# Patient Record
Sex: Female | Born: 1950
Health system: Southern US, Community
[De-identification: ages and names within clinical notes are randomized; demographics above are authoritative.]

## PROBLEM LIST (undated history)

## (undated) DIAGNOSIS — G43909 Migraine, unspecified, not intractable, without status migrainosus: Secondary | ICD-10-CM

## (undated) DIAGNOSIS — N189 Chronic kidney disease, unspecified: Secondary | ICD-10-CM

## (undated) DIAGNOSIS — G4730 Sleep apnea, unspecified: Secondary | ICD-10-CM

## (undated) DIAGNOSIS — H269 Unspecified cataract: Secondary | ICD-10-CM

## (undated) DIAGNOSIS — J45909 Unspecified asthma, uncomplicated: Secondary | ICD-10-CM

## (undated) DIAGNOSIS — F32A Depression, unspecified: Secondary | ICD-10-CM

## (undated) DIAGNOSIS — K219 Gastro-esophageal reflux disease without esophagitis: Secondary | ICD-10-CM

## (undated) DIAGNOSIS — J439 Emphysema, unspecified: Secondary | ICD-10-CM

## (undated) DIAGNOSIS — K589 Irritable bowel syndrome without diarrhea: Secondary | ICD-10-CM

## (undated) DIAGNOSIS — R011 Cardiac murmur, unspecified: Secondary | ICD-10-CM

## (undated) DIAGNOSIS — Z87440 Personal history of urinary (tract) infections: Secondary | ICD-10-CM

## (undated) DIAGNOSIS — J42 Unspecified chronic bronchitis: Secondary | ICD-10-CM

## (undated) DIAGNOSIS — K8020 Calculus of gallbladder without cholecystitis without obstruction: Secondary | ICD-10-CM

## (undated) DIAGNOSIS — R32 Unspecified urinary incontinence: Secondary | ICD-10-CM

## (undated) DIAGNOSIS — M1990 Unspecified osteoarthritis, unspecified site: Secondary | ICD-10-CM

## (undated) DIAGNOSIS — A630 Anogenital (venereal) warts: Secondary | ICD-10-CM

## (undated) DIAGNOSIS — D649 Anemia, unspecified: Secondary | ICD-10-CM

## (undated) DIAGNOSIS — R739 Hyperglycemia, unspecified: Secondary | ICD-10-CM

## (undated) DIAGNOSIS — F419 Anxiety disorder, unspecified: Secondary | ICD-10-CM

## (undated) DIAGNOSIS — F1910 Other psychoactive substance abuse, uncomplicated: Secondary | ICD-10-CM

## (undated) DIAGNOSIS — I499 Cardiac arrhythmia, unspecified: Secondary | ICD-10-CM

## (undated) DIAGNOSIS — F4310 Post-traumatic stress disorder, unspecified: Secondary | ICD-10-CM

## (undated) DIAGNOSIS — T7840XA Allergy, unspecified, initial encounter: Secondary | ICD-10-CM

## (undated) DIAGNOSIS — K760 Fatty (change of) liver, not elsewhere classified: Secondary | ICD-10-CM

## (undated) DIAGNOSIS — F329 Major depressive disorder, single episode, unspecified: Secondary | ICD-10-CM

## (undated) HISTORY — DX: Anogenital (venereal) warts: A63.0

## (undated) HISTORY — DX: Allergy, unspecified, initial encounter: T78.40XA

## (undated) HISTORY — DX: Unspecified osteoarthritis, unspecified site: M19.90

## (undated) HISTORY — DX: Unspecified asthma, uncomplicated: J45.909

## (undated) HISTORY — DX: Unspecified cataract: H26.9

## (undated) HISTORY — DX: Depression, unspecified: F32.A

## (undated) HISTORY — DX: Anemia, unspecified: D64.9

## (undated) HISTORY — DX: Other psychoactive substance abuse, uncomplicated: F19.10

## (undated) HISTORY — DX: Personal history of urinary (tract) infections: Z87.440

## (undated) HISTORY — DX: Unspecified chronic bronchitis: J42

## (undated) HISTORY — DX: Cardiac murmur, unspecified: R01.1

## (undated) HISTORY — DX: Calculus of gallbladder without cholecystitis without obstruction: K80.20

## (undated) HISTORY — DX: Gastro-esophageal reflux disease without esophagitis: K21.9

## (undated) HISTORY — DX: Anxiety disorder, unspecified: F41.9

## (undated) HISTORY — DX: Unspecified urinary incontinence: R32

## (undated) HISTORY — DX: Hyperglycemia, unspecified: R73.9

## (undated) HISTORY — DX: Fatty (change of) liver, not elsewhere classified: K76.0

## (undated) HISTORY — DX: Emphysema, unspecified: J43.9

## (undated) HISTORY — DX: Irritable bowel syndrome, unspecified: K58.9

## (undated) HISTORY — DX: Sleep apnea, unspecified: G47.30

## (undated) HISTORY — DX: Cardiac arrhythmia, unspecified: I49.9

## (undated) HISTORY — DX: Major depressive disorder, single episode, unspecified: F32.9

## (undated) HISTORY — DX: Chronic kidney disease, unspecified: N18.9

## (undated) HISTORY — DX: Post-traumatic stress disorder, unspecified: F43.10

## (undated) HISTORY — PX: CHOLECYSTECTOMY: SHX55

## (undated) HISTORY — DX: Migraine, unspecified, not intractable, without status migrainosus: G43.909

## (undated) HISTORY — PX: TUBAL LIGATION: SHX77

---

## 2016-06-21 ENCOUNTER — Ambulatory Visit (INDEPENDENT_AMBULATORY_CARE_PROVIDER_SITE_OTHER): Payer: Self-pay | Admitting: Family Medicine

## 2016-06-21 ENCOUNTER — Encounter: Payer: Self-pay | Admitting: Family Medicine

## 2016-06-21 VITALS — BP 130/86 | HR 84 | Temp 97.7°F | Ht 65.0 in | Wt 184.2 lb

## 2016-06-21 DIAGNOSIS — F331 Major depressive disorder, recurrent, moderate: Secondary | ICD-10-CM

## 2016-06-21 DIAGNOSIS — R002 Palpitations: Secondary | ICD-10-CM

## 2016-06-21 DIAGNOSIS — G4730 Sleep apnea, unspecified: Secondary | ICD-10-CM | POA: Insufficient documentation

## 2016-06-21 DIAGNOSIS — R350 Frequency of micturition: Secondary | ICD-10-CM | POA: Insufficient documentation

## 2016-06-21 DIAGNOSIS — F32A Depression, unspecified: Secondary | ICD-10-CM | POA: Insufficient documentation

## 2016-06-21 DIAGNOSIS — F329 Major depressive disorder, single episode, unspecified: Secondary | ICD-10-CM | POA: Insufficient documentation

## 2016-06-21 LAB — COMPREHENSIVE METABOLIC PANEL
ALBUMIN: 4 g/dL (ref 3.5–5.2)
ALT: 33 U/L (ref 0–35)
AST: 24 U/L (ref 0–37)
Alkaline Phosphatase: 66 U/L (ref 39–117)
BILIRUBIN TOTAL: 0.4 mg/dL (ref 0.2–1.2)
BUN: 10 mg/dL (ref 6–23)
CALCIUM: 9.9 mg/dL (ref 8.4–10.5)
CO2: 28 mEq/L (ref 19–32)
CREATININE: 0.98 mg/dL (ref 0.40–1.20)
Chloride: 104 mEq/L (ref 96–112)
GFR: 60.85 mL/min (ref >60.00)
Glucose, Bld: 92 mg/dL (ref 70–99)
Potassium: 4 mEq/L (ref 3.5–5.1)
SODIUM: 141 meq/L (ref 135–145)
Total Protein: 6.9 g/dL (ref 6.0–8.3)

## 2016-06-21 LAB — POCT URINALYSIS DIPSTICK
BILIRUBIN UA: NEGATIVE
Glucose, UA: NEGATIVE
KETONES UA: NEGATIVE
LEUKOCYTES UA: NEGATIVE
NITRITE UA: NEGATIVE
PH UA: 5.5
PROTEIN UA: NEGATIVE
Spec Grav, UA: 1.015
Urobilinogen, UA: 0.2

## 2016-06-21 LAB — CBC
HCT: 43.4 % (ref 36.0–46.0)
Hemoglobin: 14.6 g/dL (ref 12.0–15.0)
MCHC: 33.6 g/dL (ref 30.0–36.0)
MCV: 91.3 fl (ref 78.0–100.0)
Platelets: 196 10*3/uL (ref 150.0–400.0)
RBC: 4.75 Mil/uL (ref 3.87–5.11)
RDW: 13.4 % (ref 11.5–15.5)
WBC: 6.7 10*3/uL (ref 4.0–10.5)

## 2016-06-21 LAB — URINALYSIS, MICROSCOPIC ONLY: RBC / HPF: NONE SEEN (ref 0–?)

## 2016-06-21 LAB — TSH: TSH: 2.31 u[IU]/mL (ref 0.35–4.50)

## 2016-06-21 NOTE — Addendum Note (Signed)
Addended by: Frutoso Chase A on: 06/21/2016 12:50 PM   Modules accepted: Orders

## 2016-06-21 NOTE — Assessment & Plan Note (Signed)
She notes chronic urinary frequency and mild incontinence that can be stress related or unknown cause. Benign abdominal exam today. We will obtain a UA and CMP to evaluate glucose and kidney function. She'll continue to monitor at this time.

## 2016-06-21 NOTE — Assessment & Plan Note (Addendum)
Patient with a history of sleep apnea not currently on CPAP. Notes fatigue and tiredness all the time. Suspect related to untreated sleep apnea and possibly depression. Discussed the importance of using CPAP. When she is able to afford this she will consider using it. Memory issues could be related to depression or sleep apnea. She'll continue to monitor.

## 2016-06-21 NOTE — Patient Instructions (Signed)
Nice to meet you. We will check lab work and call you with the results. I would suggest seeing a cardiologist for your palpitations. I would suggest seeing a therapist for your depression. I recommend that you be evaluated for your sleep apnea for a CPAP. If you develop persistent palpitations or develop chest pain, shortness of breath, thoughts of harming yourself or others, or any new or changing symptoms please seek medical attention immediately.

## 2016-06-21 NOTE — Progress Notes (Signed)
Tommi Rumps, MD Phone: [phone]  Regina Nash is a 65 y.o. female who presents today for new patient visit.  Palpitations: Patient notes for most of her life she has felt irregular heartbeats and palpitations. Can occur if she is fatigued or lifting heavy weights. Also with walking at times. Also with anxiety. Notes they last less than a minute. Occur daily. No chest pain, shortness breath, or edema with them. She does report having had a Holter monitor about 12 years ago that she says showed nothing significant.  Depression: Has been dealing with this her whole life. Has a general feeling of sadness. She has no hopes or dreams. No SI or HI. Not currently on any medications. Not willing to start medications at this time. Would consider a therapist when she can afford it.   Urine frequency and incontinence: Patient notes for many years she's had issues with leaking small amounts of urine. She occasionally has stress incontinence. No urgency. Does have frequent urination. No changes in this over the years.  Sleep apnea: Previously evaluated for this and is supposed to be on CPAP though cannot afford it. She is fatigued and tired all the time. She'll fall sleep in front of the TV easily. Does not fall asleep while driving. Does snore.  Also notes some memory issues where she just can't remember recent events. She thinks this is due to her being foggy.  Active Ambulatory Problems    Diagnosis Date Noted  . Palpitations 06/21/2016  . Sleep apnea 06/21/2016  . Depression 06/21/2016  . Urinary frequency 06/21/2016   Resolved Ambulatory Problems    Diagnosis Date Noted  . No Resolved Ambulatory Problems   Past Medical History:  Diagnosis Date  . Allergy   . Arrhythmia   . Arthritis   . Asthma   . Chronic bronchitis ([place])   . Depression   . Genital warts   . GERD (gastroesophageal reflux disease)   . History of recurrent UTIs   . Hyperglycemia   . Migraine   . Sleep apnea   .  Substance abuse   . Urine incontinence     Family History  Problem Relation Age of Onset  . Alcoholism Other     Parent  . Arthritis Other     Parent  . Cancer Other     Breast, ovarian/uterine    Social History   Social History  . Marital status: Married    Spouse name: N/A  . Number of children: N/A  . Years of education: N/A   Occupational History  . Not on file.   Social History Main Topics  . Smoking status: Current Every Day Smoker    Types: Cigarettes  . Smokeless tobacco: Never Used  . Alcohol use Yes  . Drug use: No  . Sexual activity: Not on file   Other Topics Concern  . Not on file   Social History Narrative  . No narrative on file    ROS  General:  Negative for nexplained weight loss, fever Skin: Negative for new or changing mole, sore that won't heal HEENT: Positive for tinnitus, Negative for trouble hearing, trouble seeing, mouth sores, hoarseness, change in voice, dysphagia. CV: Positive for palpitations, Negative for chest pain, dyspnea, edema Resp: Negative for cough, dyspnea, hemoptysis GI: Positive for constipation, Negative for nausea, vomiting, diarrhea, abdominal pain, melena, hematochezia. GU: Positive for incontinence, frequent urination, Negative for dysuria, urinary hesitance, hematuria, vaginal or penile discharge, polyuria, sexual difficulty, lumps in testicle or breasts  MSK: Negative for muscle cramps or aches, positive for positive joint pain or swelling Neuro: Negative for headaches, weakness, numbness, dizziness, passing out/fainting Psych: Negative for depression, memory problems, negative for anxiety  Objective  Physical Exam Vitals:   06/21/16 1003  BP: 130/86  Pulse: 84  Temp: 97.7 F (36.5 C)    BP Readings from Last 3 Encounters:  06/21/16 130/86   Wt Readings from Last 3 Encounters:  06/21/16 184 lb 3.2 oz (83.6 kg)    Physical Exam  Constitutional: No distress.  HENT:  Head: Normocephalic and  atraumatic.  Mouth/Throat: Oropharynx is clear and moist.  Eyes: Conjunctivae are normal. Pupils are equal, round, and reactive to light.  Cardiovascular: Normal rate, regular rhythm and normal heart sounds.   Pulmonary/Chest: Effort normal and breath sounds normal.  Abdominal: Soft. Bowel sounds are normal. She exhibits no distension. There is no tenderness. There is no rebound and no guarding.  Musculoskeletal: She exhibits no edema.  Neurological: She is alert. Gait normal.  Skin: Skin is warm and dry. She is not diaphoretic.  Psychiatric:  Mood depressed, affect flat   EKG: Normal sinus rhythm, rate 80, inverted T-wave in V2, otherwise no T-wave or ST changes, no prior EKG to compare  Assessment/Plan:   Palpitations Patient with chronic history of palpitations. EKG performed today with nonspecific T-wave inversion though no arrhythmia noted. We will obtain lab work as outlined below. Advise cardiology referral though given patient's lack of insurance we will obtain lab work first to evaluate for cause. If no abnormalities on lab work we will refer to cardiology. Given return precautions.  Sleep apnea Patient with a history of sleep apnea not currently on CPAP. Notes fatigue and tiredness all the time. Suspect related to untreated sleep apnea and possibly depression. Discussed the importance of using CPAP. When she is able to afford this she will consider using it. Memory issues could be related to depression or sleep apnea. She'll continue to monitor.  Depression Patient with lifelong issues with depression. No SI or HI at this time. Offered medication though she declined. Offered referral to a therapist or counselor and she is unable to afford this at this time. She'll let us know when she is ready to see a therapist. Return precautions given.  Urinary frequency She notes chronic urinary frequency and mild incontinence that can be stress related or unknown cause. Benign abdominal exam  today. We will obtain a UA and CMP to evaluate glucose and kidney function. She'll continue to monitor at this time.   Orders Placed This Encounter  Procedures  . Comp Met (CMET)  . CBC  . TSH  . POCT Urinalysis Dipstick  . EKG 12-Lead    Tommi Rumps, MD [place]

## 2016-06-21 NOTE — Assessment & Plan Note (Addendum)
Patient with chronic history of palpitations. EKG performed today with nonspecific T-wave inversion though no arrhythmia noted. We will obtain lab work as outlined below. Advise cardiology referral though given patient's lack of insurance we will obtain lab work first to evaluate for cause. If no abnormalities on lab work we will refer to cardiology. Given return precautions.

## 2016-06-21 NOTE — Progress Notes (Signed)
Pre visit review using our clinic review tool, if applicable. No additional management support is needed unless otherwise documented below in the visit note. 

## 2016-06-21 NOTE — Assessment & Plan Note (Signed)
Patient with lifelong issues with depression. No SI or HI at this time. Offered medication though she declined. Offered referral to a therapist or counselor and she is unable to afford this at this time. She'll let us know when she is ready to see a therapist. Return precautions given.

## 2016-06-22 ENCOUNTER — Other Ambulatory Visit: Payer: Self-pay | Admitting: Family Medicine

## 2016-06-22 DIAGNOSIS — R002 Palpitations: Secondary | ICD-10-CM

## 2016-06-22 LAB — URINE CULTURE: Organism ID, Bacteria: NO GROWTH

## 2016-06-28 ENCOUNTER — Telehealth: Payer: Self-pay | Admitting: Family Medicine

## 2016-06-28 NOTE — Telephone Encounter (Signed)
Pt called back returning your call. Thank you!  Call pt @ [phone]

## 2016-06-28 NOTE — Telephone Encounter (Signed)
Patient notified

## 2016-07-13 ENCOUNTER — Telehealth: Payer: Self-pay | Admitting: Family Medicine

## 2016-07-13 NOTE — Telephone Encounter (Signed)
Pt came in to drop off forms for accommodation for work. Forms is in folder up front. Thank you!

## 2016-07-17 ENCOUNTER — Ambulatory Visit: Payer: Self-pay | Admitting: Cardiology

## 2016-07-17 NOTE — Telephone Encounter (Signed)
noted 

## 2016-07-27 ENCOUNTER — Telehealth: Payer: Self-pay

## 2016-07-27 NOTE — Telephone Encounter (Signed)
Left message to notify forms are ready to be picked up

## 2016-07-27 NOTE — Telephone Encounter (Signed)
Left message to notify forms are ready

## 2016-07-27 NOTE — Telephone Encounter (Signed)
Forms completed. Given to [place]. Of note patient was advised that she needs to see cardiology though she is refusing given that she does not have insurance. Given palpitations form was filled out to say that she should not perform any strenuous work and limit her lifting to less than 20 pounds. Limited to walking and standing still at work until she is evaluated by cardiology if she ever agrees to be evaluated by cardiology.

## 2016-08-22 ENCOUNTER — Ambulatory Visit: Payer: Self-pay | Admitting: Family Medicine

## 2017-07-12 ENCOUNTER — Telehealth: Payer: Self-pay

## 2017-07-12 NOTE — Telephone Encounter (Signed)
Informed patient that we have no appointments available here or [place]. Patient refused to drive all the way to [place]. Informed patient that she has to go to UC/ED for her back pain.

## 2017-07-12 NOTE — Telephone Encounter (Signed)
Copied from [place] [id number]. Topic: Appointment Scheduling - Scheduling Inquiry for Clinic >> Jul 12, 2017  2:09 PM Regina Nash, NT wrote: Reason for CRM: patient states her back has been hurting for 2 weeks. All providers are full until end of January. She is wanting to see someone in this office. She also is a patient of Caryl Bis and he is full until March. Please advise and contact patient back with an appt.

## 2017-08-27 ENCOUNTER — Telehealth: Payer: Self-pay | Admitting: Family Medicine

## 2017-08-27 NOTE — Telephone Encounter (Signed)
Please create a letter for her stating that she has a doctor's appointment on that day.  Thanks.

## 2017-08-27 NOTE — Telephone Encounter (Signed)
Pt would like a call back in regards to her going to urgent care for back pain.  And wanted a sooner appt than 10/15/17

## 2017-08-27 NOTE — Telephone Encounter (Signed)
Patient states she needs a letter stating that she has an appointment on 10/15/2017 with you so she can get it approved to be off for the appointment Attn: Leave of Absence team [employer] fax:[phone]

## 2017-08-28 NOTE — Telephone Encounter (Signed)
Faxed and patient notified. 

## 2017-09-12 NOTE — Telephone Encounter (Signed)
Pt calling and states that the letter that was sent to her job on 08/28/17 about that the pt has an appt on 10/15/17 needs to be faxed to her disability as well. Fax#: [phone]. Claim #: [id number] Claim number needs to be on the letter. Please call pt once sent.

## 2017-09-13 NOTE — Telephone Encounter (Signed)
Faxed copy of letter as requested patient notified.

## 2017-10-15 ENCOUNTER — Ambulatory Visit (INDEPENDENT_AMBULATORY_CARE_PROVIDER_SITE_OTHER): Payer: Managed Care, Other (non HMO) | Admitting: Family Medicine

## 2017-10-15 ENCOUNTER — Ambulatory Visit (INDEPENDENT_AMBULATORY_CARE_PROVIDER_SITE_OTHER): Payer: Managed Care, Other (non HMO)

## 2017-10-15 ENCOUNTER — Encounter: Payer: Self-pay | Admitting: Family Medicine

## 2017-10-15 VITALS — BP 130/88 | HR 91 | Temp 98.2°F | Resp 18 | Ht 65.0 in | Wt 182.1 lb

## 2017-10-15 DIAGNOSIS — Z1231 Encounter for screening mammogram for malignant neoplasm of breast: Secondary | ICD-10-CM | POA: Diagnosis not present

## 2017-10-15 DIAGNOSIS — M545 Low back pain, unspecified: Secondary | ICD-10-CM

## 2017-10-15 DIAGNOSIS — G4733 Obstructive sleep apnea (adult) (pediatric): Secondary | ICD-10-CM | POA: Diagnosis not present

## 2017-10-15 DIAGNOSIS — Z1211 Encounter for screening for malignant neoplasm of colon: Secondary | ICD-10-CM

## 2017-10-15 DIAGNOSIS — G8929 Other chronic pain: Secondary | ICD-10-CM | POA: Insufficient documentation

## 2017-10-15 DIAGNOSIS — Z1239 Encounter for other screening for malignant neoplasm of breast: Secondary | ICD-10-CM

## 2017-10-15 DIAGNOSIS — G4730 Sleep apnea, unspecified: Secondary | ICD-10-CM | POA: Diagnosis not present

## 2017-10-15 NOTE — Patient Instructions (Signed)
Nice to see you. We will get an x-ray of your back.  We will call you with the results. We will get you set up for cologuard.  Please call your insurance company to make sure they cover this. We will get a mammogram scheduled and a sleep study as well. You develop weakness, bowel or bladder dysfunction, or numbness between your legs or any worsening back pain please be evaluated immediately.

## 2017-10-15 NOTE — Assessment & Plan Note (Addendum)
New issue to me though this has been a persistent issue.  She needs paperwork filled out for being out of work since the injury occurred.  She does bring some records from paperwork that has been filled out by the urgent care physician.  This has been reviewed.  Records received from urgent care and have been reviewed.  These will be scanned into the chart.  She will continue physical therapy.  Will obtain an x-ray.  She likely will need an MRI.  Given return precautions.

## 2017-10-15 NOTE — Assessment & Plan Note (Signed)
Sleep study ordered

## 2017-10-15 NOTE — Progress Notes (Signed)
Tommi Rumps, MD Phone: [phone]  Regina Nash is a 67 y.o. female who presents today for follow-up.  Low back pain: Notes in December she stepped over a gate and then twisted her back.  She was evaluated at an urgent care.  No imaging was done.  Notes since then she has had persistent back pain.  She has had to sleep on the couch as that is beneficial.  She has had some numbness in her left toes.  No bowel or bladder incontinence or saddle anesthesia.  Prior to this she had rare back pain.  She has been seeing physical therapy with some benefit.  She has not had imaging.  OSA: Notes hypersomnia and snoring.  She does not wake up well rested.  She has not been able to afford a sleep study previously though now has insurance.  She would be willing to proceed with this now.  It has been sometime since her last mammogram.  She has also not had colon cancer screening.  These will be ordered.  Social History   Tobacco Use  Smoking Status Current Every Day Smoker  . Types: Cigarettes  Smokeless Tobacco Never Used     ROS see history of present illness  Objective  Physical Exam Vitals:   10/15/17 1321  BP: 130/88  Pulse: 91  Resp: 18  Temp: 98.2 F (36.8 C)  SpO2: 96%    BP Readings from Last 3 Encounters:  10/15/17 130/88  06/21/16 130/86   Wt Readings from Last 3 Encounters:  10/15/17 182 lb 2 oz (82.6 kg)  06/21/16 184 lb 3.2 oz (83.6 kg)    Physical Exam  Constitutional: No distress.  Cardiovascular: Normal rate, regular rhythm and normal heart sounds.  Pulmonary/Chest: Effort normal and breath sounds normal.  Musculoskeletal: She exhibits no edema.  No midline spine tenderness, no midline spine step-off, there is paraspinous muscular back tenderness in the lumbar spine  Neurological: She is alert. Gait normal.  5/5 strength bilateral quads, hamstrings, plantar flexion, and dorsiflexion, sensation light touch intact bilateral lower extremities  Skin: Skin is  warm and dry. She is not diaphoretic.     Assessment/Plan: Please see individual problem list.  Sleep apnea Sleep study ordered.  Acute bilateral low back pain without sciatica New issue to me though this has been a persistent issue.  She needs paperwork filled out for being out of work since the injury occurred.  She does bring some records from paperwork that has been filled out by the urgent care physician.  This has been reviewed.  Records received from urgent care and have been reviewed.  These will be scanned into the chart.  She will continue physical therapy.  Will obtain an x-ray.  She likely will need an MRI.  Given return precautions.  Colon cancer screening Cologuard ordered.  Breast cancer screening Mammogram scheduled.   Health Maintenance: Mammogram ordered.  Cologuard ordered.  Orders Placed This Encounter  Procedures  . MM SCREENING BREAST TOMO BILATERAL    Standing Status:   Future    Standing Expiration Date:   12/16/2018    Order Specific Question:   Reason for Exam (SYMPTOM  OR DIAGNOSIS REQUIRED)    Answer:   breast cancer screening    Order Specific Question:   Preferred imaging location?    Answer:   [facility]  . DG Lumbar Spine Complete    Standing Status:   Future    Number of Occurrences:   1  Standing Expiration Date:   12/16/2018    Order Specific Question:   Reason for Exam (SYMPTOM  OR DIAGNOSIS REQUIRED)    Answer:   low back pain, numbness left toes, injury in january    Order Specific Question:   Preferred imaging location?    Answer:   [facility] Specific Question:   Radiology Contrast Protocol - do NOT remove file path    Answer:   \\[email or web address]  . Cologuard  . Split night study    Standing Status:   Future    Standing Expiration Date:   10/16/2018    Order Specific Question:   Where should this test be performed:    Answer:   [facility]    No orders of the  defined types were placed in this encounter.    Tommi Rumps, MD [place]

## 2017-10-15 NOTE — Progress Notes (Signed)
Pre-visit discussion using our clinic review tool. No additional management support is needed unless otherwise documented below in the visit note.  

## 2017-10-17 ENCOUNTER — Telehealth: Payer: Self-pay

## 2017-10-17 DIAGNOSIS — Z1211 Encounter for screening for malignant neoplasm of colon: Secondary | ICD-10-CM | POA: Insufficient documentation

## 2017-10-17 DIAGNOSIS — Z1239 Encounter for other screening for malignant neoplasm of breast: Secondary | ICD-10-CM | POA: Insufficient documentation

## 2017-10-17 DIAGNOSIS — Z780 Asymptomatic menopausal state: Secondary | ICD-10-CM

## 2017-10-17 NOTE — Assessment & Plan Note (Signed)
Mammogram scheduled.

## 2017-10-17 NOTE — Assessment & Plan Note (Signed)
Cologuard ordered

## 2017-10-17 NOTE — Telephone Encounter (Signed)
Copied from [place] [phone]. Topic: General - Other >> Oct 17, 2017  3:43 PM Synthia Innocent wrote: Patient would like to move forward with Bone Density

## 2017-10-18 NOTE — Telephone Encounter (Signed)
Ordered

## 2017-10-18 NOTE — Telephone Encounter (Signed)
fyi

## 2017-10-18 NOTE — Addendum Note (Signed)
Addended by: Leone Haven on: 10/18/2017 06:51 PM   Modules accepted: Orders

## 2017-10-21 ENCOUNTER — Telehealth: Payer: Self-pay

## 2017-10-21 NOTE — Telephone Encounter (Signed)
Faxed completed forms to [employer] LOA team with OV note to [phone]. Faxed OV note to [place] at [phone]. Patient notified.

## 2017-10-30 ENCOUNTER — Encounter: Payer: Self-pay | Admitting: Family Medicine

## 2017-11-06 ENCOUNTER — Ambulatory Visit
Admission: RE | Admit: 2017-11-06 | Discharge: 2017-11-06 | Disposition: A | Discharge status: Home / Self Care | Payer: Managed Care, Other (non HMO) | Source: Ambulatory Visit | Attending: Family Medicine | Admitting: Family Medicine

## 2017-11-06 DIAGNOSIS — Z1231 Encounter for screening mammogram for malignant neoplasm of breast: Secondary | ICD-10-CM | POA: Insufficient documentation

## 2017-11-06 DIAGNOSIS — Z1239 Encounter for other screening for malignant neoplasm of breast: Secondary | ICD-10-CM

## 2017-11-08 ENCOUNTER — Other Ambulatory Visit: Payer: Self-pay | Admitting: Family Medicine

## 2017-11-08 DIAGNOSIS — N6489 Other specified disorders of breast: Secondary | ICD-10-CM

## 2017-11-08 DIAGNOSIS — R928 Other abnormal and inconclusive findings on diagnostic imaging of breast: Secondary | ICD-10-CM

## 2017-11-11 ENCOUNTER — Ambulatory Visit (INDEPENDENT_AMBULATORY_CARE_PROVIDER_SITE_OTHER): Payer: 59 | Admitting: Psychology

## 2017-11-11 DIAGNOSIS — F332 Major depressive disorder, recurrent severe without psychotic features: Secondary | ICD-10-CM

## 2017-11-12 LAB — COLOGUARD: Cologuard: POSITIVE

## 2017-11-13 ENCOUNTER — Ambulatory Visit
Admission: RE | Admit: 2017-11-13 | Discharge: 2017-11-13 | Disposition: A | Discharge status: Home / Self Care | Payer: Managed Care, Other (non HMO) | Source: Ambulatory Visit | Attending: Family Medicine | Admitting: Family Medicine

## 2017-11-13 DIAGNOSIS — N6489 Other specified disorders of breast: Secondary | ICD-10-CM | POA: Insufficient documentation

## 2017-11-13 DIAGNOSIS — R928 Other abnormal and inconclusive findings on diagnostic imaging of breast: Secondary | ICD-10-CM

## 2017-11-14 ENCOUNTER — Telehealth: Payer: Self-pay | Admitting: Family Medicine

## 2017-11-14 NOTE — Telephone Encounter (Unsigned)
Copied from [place] [id number]. Topic: Quick Communication - See Telephone Encounter >> Nov 14, 2017  3:25 PM Neva Seat wrote: Pt needing a copy of the leave of absence notes on form that was filled out on April 2 at her last visit. Please call pt to discuss where they can be sent or to be picked up.

## 2017-11-14 NOTE — Telephone Encounter (Signed)
Please advise 

## 2017-11-18 NOTE — Telephone Encounter (Signed)
Left message to return call, I do not see these scanned in the chart form April. [place] for pec to speak to patient

## 2017-11-18 NOTE — Telephone Encounter (Signed)
Pt would like to know where the original copies of her leave of absence forms are? She states they were faxed to [place] and [employer] on 10/22/17 from the office. Please advise.

## 2017-11-19 NOTE — Telephone Encounter (Signed)
Informed patient that these have been sent to scanning and they are not in the chart yet

## 2017-11-20 ENCOUNTER — Other Ambulatory Visit (HOSPITAL_COMMUNITY)
Admission: RE | Admit: 2017-11-20 | Discharge: 2017-11-20 | Disposition: A | Discharge status: Home / Self Care | Payer: Managed Care, Other (non HMO) | Source: Ambulatory Visit | Attending: Family Medicine | Admitting: Family Medicine

## 2017-11-20 ENCOUNTER — Ambulatory Visit (INDEPENDENT_AMBULATORY_CARE_PROVIDER_SITE_OTHER): Payer: Managed Care, Other (non HMO) | Admitting: Family Medicine

## 2017-11-20 ENCOUNTER — Encounter: Payer: Self-pay | Admitting: Family Medicine

## 2017-11-20 ENCOUNTER — Ambulatory Visit: Payer: 59 | Admitting: Psychology

## 2017-11-20 VITALS — BP 114/82 | HR 85 | Temp 97.7°F | Ht 65.0 in | Wt 184.0 lb

## 2017-11-20 DIAGNOSIS — Z6830 Body mass index (BMI) 30.0-30.9, adult: Secondary | ICD-10-CM | POA: Diagnosis not present

## 2017-11-20 DIAGNOSIS — Z13220 Encounter for screening for lipoid disorders: Secondary | ICD-10-CM | POA: Diagnosis not present

## 2017-11-20 DIAGNOSIS — Z114 Encounter for screening for human immunodeficiency virus [HIV]: Secondary | ICD-10-CM

## 2017-11-20 DIAGNOSIS — Z1159 Encounter for screening for other viral diseases: Secondary | ICD-10-CM | POA: Diagnosis not present

## 2017-11-20 DIAGNOSIS — E6609 Other obesity due to excess calories: Secondary | ICD-10-CM

## 2017-11-20 DIAGNOSIS — Z130 Encounter for screening for diseases of the blood and blood-forming organs and certain disorders involving the immune mechanism: Secondary | ICD-10-CM | POA: Diagnosis not present

## 2017-11-20 DIAGNOSIS — L858 Other specified epidermal thickening: Secondary | ICD-10-CM

## 2017-11-20 DIAGNOSIS — N889 Noninflammatory disorder of cervix uteri, unspecified: Secondary | ICD-10-CM | POA: Diagnosis not present

## 2017-11-20 DIAGNOSIS — Z124 Encounter for screening for malignant neoplasm of cervix: Secondary | ICD-10-CM | POA: Diagnosis not present

## 2017-11-20 DIAGNOSIS — Z0001 Encounter for general adult medical examination with abnormal findings: Secondary | ICD-10-CM | POA: Diagnosis not present

## 2017-11-20 NOTE — Patient Instructions (Addendum)
Nice to see you. He is try to eat a healthier diet.  Please try to stay as active as possible. We will get you to see GYN as well as dermatology. We will have you return for fasting labs.

## 2017-11-20 NOTE — Progress Notes (Signed)
Tommi Rumps, MD Phone: [phone]  Regina Nash is a 67 y.o. female who presents today for CPE.  Not exercising much due to her chronic back pain. Describes her diet as crap. Mammogram recently done and follow-up imaging done.  Yearly mammograms. No recent Pap smears.  She reports a history of condyloma in the past.  She no longer has menstrual cycles. She completed Cologuard about a week ago. She reports her mother had either uterine or cervical cancer though she is unsure of the exact cause.  This was in her mid to late 76s.  Breast cancer in her paternal aunt.  No family history of colon cancer. She declines tetanus vaccination and shingles vaccination. No prior hepatitis C or HIV screening. She is smoking half a pack per day since 2004.  She reports less than a 30-pack-year history.  She drinks 2 alcoholic beverages daily and this is decreased from prior.  She was drinking 6-9 several years ago. No illicit drug use. She does not want to see a dentist.  She sees an ophthalmologist and just recently established with them. She has a skin lesion on the right aspect of her neck that has been more bothersome and is getting irritated.  Active Ambulatory Problems    Diagnosis Date Noted  . Palpitations 06/21/2016  . Sleep apnea 06/21/2016  . Depression 06/21/2016  . Urinary frequency 06/21/2016  . Acute bilateral low back pain without sciatica 10/15/2017  . Colon cancer screening 10/17/2017  . Breast cancer screening 10/17/2017  . Encounter for general adult medical examination with abnormal findings 11/20/2017  . Abnormal appearance of cervix 11/20/2017  . Cutaneous horn 11/20/2017   Resolved Ambulatory Problems    Diagnosis Date Noted  . No Resolved Ambulatory Problems   Past Medical History:  Diagnosis Date  . Allergy   . Arrhythmia   . Arthritis   . Asthma   . Chronic bronchitis ([place])   . Depression   . Genital warts   . GERD (gastroesophageal reflux disease)   .  History of recurrent UTIs   . Hyperglycemia   . Migraine   . Sleep apnea   . Substance abuse ([place])   . Urine incontinence     Family History  Problem Relation Age of Onset  . Alcoholism Other        Parent  . Arthritis Other        Parent  . Cancer Other        Breast, ovarian/uterine  . Breast cancer Paternal Aunt        60's    Social History   Socioeconomic History  . Marital status: Married    Spouse name: Not on file  . Number of children: Not on file  . Years of education: Not on file  . Highest education level: Not on file  Occupational History  . Not on file  Social Needs  . Financial resource strain: Not on file  . Food insecurity:    Worry: Not on file    Inability: Not on file  . Transportation needs:    Medical: Not on file    Non-medical: Not on file  Tobacco Use  . Smoking status: Current Every Day Smoker    Types: Cigarettes  . Smokeless tobacco: Never Used  Substance and Sexual Activity  . Alcohol use: Yes  . Drug use: No  . Sexual activity: Not on file  Lifestyle  . Physical activity:    Days per week: Not on file  Minutes per session: Not on file  . Stress: Not on file  Relationships  . Social connections:    Talks on phone: Not on file    Gets together: Not on file    Attends religious service: Not on file    Active member of club or organization: Not on file    Attends meetings of clubs or organizations: Not on file    Relationship status: Not on file  . Intimate partner violence:    Fear of current or ex partner: Not on file    Emotionally abused: Not on file    Physically abused: Not on file    Forced sexual activity: Not on file  Other Topics Concern  . Not on file  Social History Narrative  . Not on file    ROS  General:  Negative for nexplained weight loss, fever Skin: Positive for new or changing skin lesion, negative for sore that won't heal HEENT: Negative for trouble hearing, trouble seeing, ringing in ears,  mouth sores, hoarseness, change in voice, dysphagia. CV:  Negative for chest pain, dyspnea, edema, palpitations Resp: Negative for cough, dyspnea, hemoptysis GI: Negative for nausea, vomiting, diarrhea, constipation, abdominal pain, melena, hematochezia. GU: Negative for dysuria, incontinence, urinary hesitance, hematuria, vaginal or penile discharge, polyuria, sexual difficulty, lumps in testicle or breasts MSK: Negative for muscle cramps or aches, joint pain or swelling Neuro: Negative for headaches, weakness, numbness, dizziness, passing out/fainting Psych: Negative for depression, anxiety, memory problems  Objective  Physical Exam Vitals:   11/20/17 1415  BP: 114/82  Pulse: 85  Temp: 97.7 F (36.5 C)  SpO2: 97%    BP Readings from Last 3 Encounters:  11/20/17 114/82  10/15/17 130/88  06/21/16 130/86   Wt Readings from Last 3 Encounters:  11/20/17 184 lb (83.5 kg)  10/15/17 182 lb 2 oz (82.6 kg)  06/21/16 184 lb 3.2 oz (83.6 kg)    Physical Exam  Constitutional: No distress.  HENT:  Head: Normocephalic and atraumatic.  Mouth/Throat: Oropharynx is clear and moist.  Eyes: Pupils are equal, round, and reactive to light. Conjunctivae are normal.  Neck:    Cardiovascular: Normal rate, regular rhythm and normal heart sounds.  Pulmonary/Chest: Effort normal and breath sounds normal.  Abdominal: Soft. Bowel sounds are normal. She exhibits no distension. There is no tenderness. There is no rebound and no guarding.  Genitourinary:  Genitourinary Comments: Chaperone used, normal labia, normal vaginal mucosa, cervix with slight abnormal appearance around the cervical office without obvious mass or lesion, no cervical motion tenderness, no adnexal tenderness  Musculoskeletal: She exhibits no edema.  Neurological: She is alert.  Skin: Skin is warm and dry. She is not diaphoretic.  Psychiatric: She has a normal mood and affect.     Assessment/Plan:   Encounter for general  adult medical examination with abnormal findings Physical exam completed.  Encouraged dietary changes.  She will exercise as much as able.  Pap smear completed.  Refer to gynecology given abnormal appearance.  We will await Cologuard results.  She declined tetanus vaccination and shingles vaccination.  Encourage smoking cessation.  Encouraged her to decrease alcohol intake.  Encouraged her to see a dentist.  Lab work as outlined below.  Abnormal appearance of cervix Refer to gynecology for evaluation.  Pap smear completed.  Cutaneous horn Suspect cutaneous horn though could be a skin tag.  Refer to dermatology for removal.   Orders Placed This Encounter  Procedures  . CBC    Standing Status:  Future    Standing Expiration Date:   11/21/2018  . Comp Met (CMET)    Standing Status:   Future    Standing Expiration Date:   11/21/2018  . Lipid panel    Standing Status:   Future    Standing Expiration Date:   11/21/2018  . Hemoglobin A1c    Standing Status:   Future    Standing Expiration Date:   11/21/2018  . Hepatitis C antibody    Standing Status:   Future    Standing Expiration Date:   11/21/2018  . HIV antibody (with reflex)    Standing Status:   Future    Standing Expiration Date:   11/21/2018  . Ambulatory referral to Dermatology    Referral Priority:   Routine    Referral Type:   Consultation    Referral Reason:   Specialty Services Required    Requested Specialty:   Dermatology    Number of Visits Requested:   1  . Ambulatory referral to Gynecology    Referral Priority:   Routine    Referral Type:   Consultation    Referral Reason:   Specialty Services Required    Requested Specialty:   Gynecology    Number of Visits Requested:   1    No orders of the defined types were placed in this encounter.    Tommi Rumps, MD [place]

## 2017-11-20 NOTE — Assessment & Plan Note (Signed)
Physical exam completed.  Encouraged dietary changes.  She will exercise as much as able.  Pap smear completed.  Refer to gynecology given abnormal appearance.  We will await Cologuard results.  She declined tetanus vaccination and shingles vaccination.  Encourage smoking cessation.  Encouraged her to decrease alcohol intake.  Encouraged her to see a dentist.  Lab work as outlined below.

## 2017-11-20 NOTE — Assessment & Plan Note (Signed)
Suspect cutaneous horn though could be a skin tag.  Refer to dermatology for removal.

## 2017-11-20 NOTE — Assessment & Plan Note (Signed)
Refer to gynecology for evaluation.  Pap smear completed.

## 2017-11-22 ENCOUNTER — Ambulatory Visit: Payer: 59 | Admitting: Psychology

## 2017-11-22 ENCOUNTER — Telehealth: Payer: Self-pay | Admitting: *Deleted

## 2017-11-22 DIAGNOSIS — F332 Major depressive disorder, recurrent severe without psychotic features: Secondary | ICD-10-CM

## 2017-11-22 LAB — CYTOLOGY - PAP
Diagnosis: NEGATIVE
HPV (WINDOPATH): NOT DETECTED

## 2017-11-22 NOTE — Telephone Encounter (Signed)
Copied from [place] [id number]. Topic: Inquiry >> Nov 22, 2017  1:51 PM Oliver Pila B wrote: Reason for CRM: pt called and states she's had 2 or 3 missed phone calls from the office call pt if needed

## 2017-11-25 NOTE — Telephone Encounter (Signed)
Patient is coming in 11/27/17

## 2017-11-26 ENCOUNTER — Telehealth: Payer: Self-pay

## 2017-11-26 ENCOUNTER — Encounter: Payer: Self-pay | Admitting: Family Medicine

## 2017-11-26 NOTE — Telephone Encounter (Signed)
Copied from [place] [id number]. Topic: Inquiry >> Nov 26, 2017 11:41 AM Arletha Grippe wrote: Reason for CRM: cologuard is calling to make sure that we received abnormal result fax.  It was sent 11/25/17.  If not received, call [phone] provider support  [id number] reference number

## 2017-11-26 NOTE — Telephone Encounter (Signed)
Yes we have received results. Given to provider.

## 2017-11-27 ENCOUNTER — Telehealth: Payer: Self-pay | Admitting: Family Medicine

## 2017-11-27 ENCOUNTER — Ambulatory Visit: Payer: Managed Care, Other (non HMO) | Admitting: Family Medicine

## 2017-11-27 ENCOUNTER — Other Ambulatory Visit: Payer: Self-pay

## 2017-11-27 ENCOUNTER — Encounter: Payer: Self-pay | Admitting: Family Medicine

## 2017-11-27 VITALS — BP 130/84 | HR 84 | Temp 97.5°F | Ht 65.0 in | Wt 184.0 lb

## 2017-11-27 DIAGNOSIS — R195 Other fecal abnormalities: Secondary | ICD-10-CM | POA: Diagnosis not present

## 2017-11-27 DIAGNOSIS — G8929 Other chronic pain: Secondary | ICD-10-CM

## 2017-11-27 DIAGNOSIS — G4730 Sleep apnea, unspecified: Secondary | ICD-10-CM | POA: Diagnosis not present

## 2017-11-27 DIAGNOSIS — M545 Low back pain, unspecified: Secondary | ICD-10-CM

## 2017-11-27 NOTE — Assessment & Plan Note (Signed)
Needs sleep study.  We will get this set up for her.

## 2017-11-27 NOTE — Telephone Encounter (Signed)
noted 

## 2017-11-27 NOTE — Assessment & Plan Note (Signed)
At this point I would classify this as chronic low back pain given duration.  Given persistent numbness we will proceed with MRI lumbar spine.  She will continue physical therapy.  She has a form that we will need to fill out for her disability.

## 2017-11-27 NOTE — Telephone Encounter (Signed)
Pt saw Dr. Caryl Bis today. She would like a copy of the [employer] form that Dr. Caryl Bis is completing for her. Please call her when ready for pickup.

## 2017-11-27 NOTE — Patient Instructions (Signed)
Nice to see you. We will get you set up for an MRI of your back. We will get you set up for your sleep study. We will get you set up to see GI.

## 2017-11-27 NOTE — Assessment & Plan Note (Signed)
Discussed result with patient.  Refer to GI for colonoscopy.

## 2017-11-27 NOTE — Progress Notes (Signed)
Tommi Rumps, MD Phone: [phone]  Regina Nash is a 67 y.o. female who presents today for f/u.  Back pain: Notes this is not much better.  She notes intermittent numbness and tingling on the lateral aspect of her left leg down to the lateral aspect of her left foot.  No weakness.  No other numbness.  No incontinence.  No saddle anesthesia.  Notes it hurts over her sacrum.  She is slow to get in and out of chairs given discomfort.  She notes she has difficulty standing and sitting for long periods of time.  She can not push or pull or lift anything due to pain.  She can walk with mild discomfort.  Lying down is the most beneficial thing.  She is able to dress herself.  She was supposed to have a sleep study done though did not have this done.  She is tired during the day.  She notes her sleep is disjointed and has been prior to her back injury.  Some snoring and apneic episodes.  She had a positive Cologuard test.  She needs referral for colonoscopy.  No blood in her stool.  Social History   Tobacco Use  Smoking Status Current Every Day Smoker  . Types: Cigarettes  Smokeless Tobacco Never Used     ROS see history of present illness  Objective  Physical Exam Vitals:   11/27/17 1452  BP: 130/84  Pulse: 84  Temp: (!) 97.5 F (36.4 C)  SpO2: 97%    BP Readings from Last 3 Encounters:  11/27/17 130/84  11/20/17 114/82  10/15/17 130/88   Wt Readings from Last 3 Encounters:  11/27/17 184 lb (83.5 kg)  11/20/17 184 lb (83.5 kg)  10/15/17 182 lb 2 oz (82.6 kg)    Physical Exam  Constitutional: No distress.  Cardiovascular: Normal rate, regular rhythm and normal heart sounds.  Pulmonary/Chest: Effort normal and breath sounds normal.  Musculoskeletal: She exhibits no edema.  No midline spine tenderness, no midline spine step-off, mild muscular lumbar back tenderness, she is slow to get in and out of the chair, 5/5 strength bilateral quads, hamstrings, plantar flexion,  and dorsiflexion, sensation light touch intact bilateral lower extremities  Neurological: She is alert.  Skin: Skin is warm and dry. She is not diaphoretic.     Assessment/Plan: Please see individual problem list.  Chronic low back pain without sciatica At this point I would classify this as chronic low back pain given duration.  Given persistent numbness we will proceed with MRI lumbar spine.  She will continue physical therapy.  She has a form that we will need to fill out for her disability.  Sleep apnea Needs sleep study.  We will get this set up for her.  Positive colorectal cancer screening using Cologuard test Discussed result with patient.  Refer to GI for colonoscopy.   Orders Placed This Encounter  Procedures  . MR Lumbar Spine Wo Contrast    Standing Status:   Future    Standing Expiration Date:   01/28/2019    Order Specific Question:   What is the patient's sedation requirement?    Answer:   No Sedation    Order Specific Question:   Does the patient have a pacemaker or implanted devices?    Answer:   No    Order Specific Question:   Preferred imaging location?    Answer:   [facility] (table limit-300lbs)    Order Specific Question:   Radiology Contrast Protocol -  do NOT remove file path    Answer:   [email or web address]  . Ambulatory referral to Gastroenterology    Referral Priority:   Routine    Referral Type:   Consultation    Referral Reason:   Specialty Services Required    Number of Visits Requested:   1    No orders of the defined types were placed in this encounter.    Tommi Rumps, MD [place]

## 2017-11-29 ENCOUNTER — Ambulatory Visit: Payer: 59 | Admitting: Psychology

## 2017-11-29 DIAGNOSIS — F332 Major depressive disorder, recurrent severe without psychotic features: Secondary | ICD-10-CM | POA: Diagnosis not present

## 2017-12-02 ENCOUNTER — Other Ambulatory Visit: Payer: Self-pay

## 2017-12-02 DIAGNOSIS — R195 Other fecal abnormalities: Secondary | ICD-10-CM

## 2017-12-03 ENCOUNTER — Encounter: Payer: Self-pay | Admitting: Family Medicine

## 2017-12-07 ENCOUNTER — Encounter: Payer: Self-pay | Admitting: Family Medicine

## 2017-12-12 ENCOUNTER — Other Ambulatory Visit: Payer: Self-pay

## 2017-12-12 ENCOUNTER — Other Ambulatory Visit (INDEPENDENT_AMBULATORY_CARE_PROVIDER_SITE_OTHER): Payer: Managed Care, Other (non HMO)

## 2017-12-12 DIAGNOSIS — Z13220 Encounter for screening for lipoid disorders: Secondary | ICD-10-CM

## 2017-12-12 DIAGNOSIS — Z1159 Encounter for screening for other viral diseases: Secondary | ICD-10-CM

## 2017-12-12 DIAGNOSIS — Z130 Encounter for screening for diseases of the blood and blood-forming organs and certain disorders involving the immune mechanism: Secondary | ICD-10-CM

## 2017-12-12 DIAGNOSIS — Z6830 Body mass index (BMI) 30.0-30.9, adult: Secondary | ICD-10-CM | POA: Diagnosis not present

## 2017-12-12 DIAGNOSIS — Z114 Encounter for screening for human immunodeficiency virus [HIV]: Secondary | ICD-10-CM

## 2017-12-12 DIAGNOSIS — E6609 Other obesity due to excess calories: Secondary | ICD-10-CM | POA: Diagnosis not present

## 2017-12-13 ENCOUNTER — Telehealth: Payer: Self-pay

## 2017-12-13 ENCOUNTER — Encounter: Payer: Self-pay | Admitting: Family Medicine

## 2017-12-13 ENCOUNTER — Ambulatory Visit: Admission: RE | Admit: 2017-12-13 | Payer: Managed Care, Other (non HMO) | Source: Ambulatory Visit

## 2017-12-13 LAB — COMPREHENSIVE METABOLIC PANEL
ALBUMIN: 4 g/dL (ref 3.5–5.2)
ALT: 28 U/L (ref 0–35)
AST: 23 U/L (ref 0–37)
Alkaline Phosphatase: 67 U/L (ref 39–117)
BUN: 11 mg/dL (ref 6–23)
CHLORIDE: 104 meq/L (ref 96–112)
CO2: 28 meq/L (ref 19–32)
CREATININE: 0.99 mg/dL (ref 0.40–1.20)
Calcium: 9.6 mg/dL (ref 8.4–10.5)
GFR: 59.86 mL/min — ABNORMAL LOW (ref >60.00)
GLUCOSE: 91 mg/dL (ref 70–99)
POTASSIUM: 4.1 meq/L (ref 3.5–5.1)
Sodium: 139 mEq/L (ref 135–145)
Total Bilirubin: 0.5 mg/dL (ref 0.2–1.2)
Total Protein: 7.2 g/dL (ref 6.0–8.3)

## 2017-12-13 LAB — LIPID PANEL
CHOL/HDL RATIO: 5
CHOLESTEROL: 222 mg/dL — AB (ref 0–200)
HDL: 40.4 mg/dL (ref >39.00)
LDL CALC: 142 mg/dL — AB (ref 0–99)
NONHDL: 181.45
Triglycerides: 198 mg/dL — ABNORMAL HIGH (ref 0.0–149.0)
VLDL: 39.6 mg/dL (ref 0.0–40.0)

## 2017-12-13 LAB — CBC
HEMATOCRIT: 43.5 % (ref 36.0–46.0)
HEMOGLOBIN: 14.7 g/dL (ref 12.0–15.0)
MCHC: 33.9 g/dL (ref 30.0–36.0)
MCV: 92.1 fl (ref 78.0–100.0)
Platelets: 199 10*3/uL (ref 150.0–400.0)
RBC: 4.72 Mil/uL (ref 3.87–5.11)
RDW: 13.3 % (ref 11.5–15.5)
WBC: 6.4 10*3/uL (ref 4.0–10.5)

## 2017-12-13 LAB — HIV ANTIBODY (ROUTINE TESTING W REFLEX): HIV 1&2 Ab, 4th Generation: NONREACTIVE

## 2017-12-13 LAB — HEPATITIS C ANTIBODY
HEP C AB: NONREACTIVE
SIGNAL TO CUT-OFF: 0.02 (ref <1.00)

## 2017-12-13 LAB — HEMOGLOBIN A1C: Hgb A1c MFr Bld: 5.8 % (ref 4.6–6.5)

## 2017-12-13 NOTE — Telephone Encounter (Signed)
Copied from [place] [id number]. Topic: Inquiry >> Dec 12, 2017  2:04 PM Oliver Pila B wrote: Reason for CRM: [facility] called and they are needing the OV notes for the referral that they received for the sleep study, contact Sharyn Lull [phone] ext [phone]  Last office note has been faxed to [facility] at [phone]

## 2017-12-16 ENCOUNTER — Encounter: Payer: Self-pay | Admitting: Family Medicine

## 2017-12-17 ENCOUNTER — Ambulatory Visit
Admission: RE | Admit: 2017-12-17 | Discharge: 2017-12-17 | Disposition: A | Discharge status: Home / Self Care | Payer: Managed Care, Other (non HMO) | Source: Ambulatory Visit | Attending: Family Medicine | Admitting: Family Medicine

## 2017-12-17 DIAGNOSIS — M48061 Spinal stenosis, lumbar region without neurogenic claudication: Secondary | ICD-10-CM | POA: Insufficient documentation

## 2017-12-17 DIAGNOSIS — M545 Low back pain, unspecified: Secondary | ICD-10-CM

## 2017-12-17 DIAGNOSIS — M5136 Other intervertebral disc degeneration, lumbar region: Secondary | ICD-10-CM | POA: Insufficient documentation

## 2017-12-17 DIAGNOSIS — M4856XA Collapsed vertebra, not elsewhere classified, lumbar region, initial encounter for fracture: Secondary | ICD-10-CM | POA: Diagnosis not present

## 2017-12-19 ENCOUNTER — Other Ambulatory Visit: Payer: Self-pay | Admitting: Family Medicine

## 2017-12-19 ENCOUNTER — Encounter: Payer: Self-pay | Admitting: Family Medicine

## 2017-12-19 ENCOUNTER — Telehealth: Payer: Self-pay

## 2017-12-19 DIAGNOSIS — R1013 Epigastric pain: Principal | ICD-10-CM

## 2017-12-19 DIAGNOSIS — R195 Other fecal abnormalities: Secondary | ICD-10-CM

## 2017-12-19 DIAGNOSIS — M5136 Other intervertebral disc degeneration, lumbar region: Secondary | ICD-10-CM

## 2017-12-19 DIAGNOSIS — G8929 Other chronic pain: Secondary | ICD-10-CM

## 2017-12-19 NOTE — Telephone Encounter (Signed)
-----   Message from Leone Haven, MD sent at 12/18/2017  5:36 PM EDT ----- Please let the patient know that the MRI revealed the compression fracture that was previously known.  It also revealed that there was mild lumbar degenerative disc disease and moderate multilevel arthritic changes.  Given her symptoms and these findings I would suggest we have her see an orthopedic surgeon who specializes in the spine.  This would require a referral to [facility].  If she would prefer to stay locally we can refer to orthopedics locally as well.  Thanks.

## 2017-12-20 ENCOUNTER — Ambulatory Visit: Payer: 59 | Admitting: Psychology

## 2017-12-20 ENCOUNTER — Encounter: Payer: Self-pay | Admitting: *Deleted

## 2017-12-20 DIAGNOSIS — F322 Major depressive disorder, single episode, severe without psychotic features: Secondary | ICD-10-CM

## 2017-12-23 ENCOUNTER — Encounter: Payer: Self-pay | Admitting: Certified Registered Nurse Anesthetist

## 2017-12-23 ENCOUNTER — Ambulatory Visit
Admission: RE | Admit: 2017-12-23 | Payer: Managed Care, Other (non HMO) | Source: Ambulatory Visit | Admitting: Gastroenterology

## 2017-12-23 ENCOUNTER — Encounter: Admission: RE | Payer: Self-pay | Source: Ambulatory Visit

## 2017-12-23 ENCOUNTER — Telehealth: Payer: Self-pay | Admitting: Gastroenterology

## 2017-12-23 SURGERY — COLONOSCOPY WITH PROPOFOL
Anesthesia: General

## 2017-12-23 NOTE — Telephone Encounter (Signed)
Regina Nash , she has never been seen by me, positive cologuard- happy to see her in the office  Dr Jonathon Bellows MD,MRCP [facility]) Gastroenterology/Hepatology Pager: [phone]

## 2017-12-23 NOTE — Telephone Encounter (Signed)
Pt left vm she states she needs to reschedule colonoscopy for 6/10 at 9:15 she would like to speak with her  Doctor  first

## 2017-12-23 NOTE — Telephone Encounter (Signed)
Left vm for pt to call office and schedule office visit with Dr. Vicente Males per note

## 2017-12-24 ENCOUNTER — Encounter: Payer: Self-pay | Admitting: Family Medicine

## 2017-12-24 ENCOUNTER — Other Ambulatory Visit: Payer: Self-pay

## 2017-12-24 NOTE — Telephone Encounter (Signed)
The patient has given permission to send records through this communication. No other release is needed.

## 2017-12-25 ENCOUNTER — Ambulatory Visit: Payer: 59 | Admitting: Psychology

## 2017-12-25 ENCOUNTER — Telehealth: Payer: Self-pay

## 2017-12-25 DIAGNOSIS — F332 Major depressive disorder, recurrent severe without psychotic features: Secondary | ICD-10-CM

## 2017-12-25 NOTE — Telephone Encounter (Signed)
PT CD placed up front.

## 2017-12-25 NOTE — Telephone Encounter (Signed)
Patient would like a CD of her xray done here in the office

## 2017-12-27 ENCOUNTER — Encounter: Payer: Self-pay | Admitting: Family Medicine

## 2018-01-06 ENCOUNTER — Ambulatory Visit: Payer: 59 | Admitting: Psychology

## 2018-01-07 ENCOUNTER — Ambulatory Visit: Payer: 59 | Admitting: Psychology

## 2018-01-20 ENCOUNTER — Ambulatory Visit: Payer: Medicare Other | Admitting: Psychology

## 2018-01-20 DIAGNOSIS — F332 Major depressive disorder, recurrent severe without psychotic features: Secondary | ICD-10-CM

## 2018-01-22 ENCOUNTER — Telehealth: Payer: Self-pay | Admitting: Family Medicine

## 2018-01-22 NOTE — Telephone Encounter (Signed)
Copied from [place] [id number]. Topic: Quick Communication - See Telephone Encounter >> Jan 22, 2018 12:18 PM Gardiner Ramus wrote: CRM for notification. See Telephone encounter for: 01/22/18. [place] from dr Alvino Blood office called and asked if dr Caryl Bis would like to add restrictions or limitations regarding disability claim. Please advise [phone]

## 2018-01-23 ENCOUNTER — Other Ambulatory Visit: Payer: Self-pay

## 2018-01-23 ENCOUNTER — Ambulatory Visit: Payer: Medicare Other | Admitting: Gastroenterology

## 2018-01-23 ENCOUNTER — Encounter: Payer: Self-pay | Admitting: Gastroenterology

## 2018-01-23 VITALS — BP 136/82 | HR 87 | Resp 17 | Ht 65.0 in | Wt 180.2 lb

## 2018-01-23 DIAGNOSIS — R195 Other fecal abnormalities: Secondary | ICD-10-CM

## 2018-01-23 DIAGNOSIS — K581 Irritable bowel syndrome with constipation: Secondary | ICD-10-CM

## 2018-01-23 MED ORDER — RANITIDINE HCL 150 MG PO TABS: 150.0000 mg | ORAL_TABLET | Freq: Two times a day (BID) | ORAL | 2 refills | Status: DC

## 2018-01-23 NOTE — Patient Instructions (Signed)
High-Fiber Diet  Fiber, also called dietary fiber, is a type of carbohydrate found in fruits, vegetables, whole grains, and beans. A high-fiber diet can have many health benefits. Your health care provider may recommend a high-fiber diet to help:  · Prevent constipation. Fiber can make your bowel movements more regular.  · Lower your cholesterol.  · Relieve hemorrhoids, uncomplicated diverticulosis, or irritable bowel syndrome.  · Prevent overeating as part of a weight-loss plan.  · Prevent heart disease, type 2 diabetes, and certain cancers.    What is my plan?  The recommended daily intake of fiber includes:  · 38 grams for men under age 50.  · 30 grams for men over age 50.  · 25 grams for women under age 50.  · 21 grams for women over age 50.    You can get the recommended daily intake of dietary fiber by eating a variety of fruits, vegetables, grains, and beans. Your health care provider may also recommend a fiber supplement if it is not possible to get enough fiber through your diet.  What do I need to know about a high-fiber diet?  · Fiber supplements have not been widely studied for their effectiveness, so it is better to get fiber through food sources.  · Always check the fiber content on the nutrition facts label of any prepackaged food. Look for foods that contain at least 5 grams of fiber per serving.  · Ask your dietitian if you have questions about specific foods that are related to your condition, especially if those foods are not listed in the following section.  · Increase your daily fiber consumption gradually. Increasing your intake of dietary fiber too quickly may cause bloating, cramping, or gas.  · Drink plenty of water. Water helps you to digest fiber.  What foods can I eat?  Grains  Whole-grain breads. Multigrain cereal. Oats and oatmeal. Brown rice. Barley. Bulgur wheat. Millet. Bran muffins. Popcorn. Rye wafer crackers.  Vegetables   Sweet potatoes. Spinach. Kale. Artichokes. Cabbage. Broccoli. Green peas. Carrots. Squash.  Fruits  Berries. Pears. Apples. Oranges. Avocados. Prunes and raisins. Dried figs.  Meats and Other Protein Sources  Navy, kidney, pinto, and soy beans. Split peas. Lentils. Nuts and seeds.  Dairy  Fiber-fortified yogurt.  Beverages  Fiber-fortified soy milk. Fiber-fortified orange juice.  Other  Fiber bars.  The items listed above may not be a complete list of recommended foods or beverages. Contact your dietitian for more options.  What foods are not recommended?  Grains  White bread. Pasta made with refined flour. White rice.  Vegetables  Fried potatoes. Canned vegetables. Well-cooked vegetables.  Fruits  Fruit juice. Cooked, strained fruit.  Meats and Other Protein Sources  Fatty cuts of meat. Fried poultry or fried fish.  Dairy  Milk. Yogurt. Cream cheese. Sour cream.  Beverages  Soft drinks.  Other  Cakes and pastries. Butter and oils.  The items listed above may not be a complete list of foods and beverages to avoid. Contact your dietitian for more information.  What are some tips for including high-fiber foods in my diet?  · Eat a wide variety of high-fiber foods.  · Make sure that half of all grains consumed each day are whole grains.  · Replace breads and cereals made from refined flour or white flour with whole-grain breads and cereals.  · Replace white rice with brown rice, bulgur wheat, or millet.  · Start the day with a breakfast that is high in fiber,   such as a cereal that contains at least 5 grams of fiber per serving.  · Use beans in place of meat in soups, salads, or pasta.  · Eat high-fiber snacks, such as berries, raw vegetables, nuts, or popcorn.  This information is not intended to replace advice given to you by your health care provider. Make sure you discuss any questions you have with your health care provider.  Document Released: 07/02/2005 Document Revised: 12/08/2015 Document Reviewed: 12/15/2013   Elsevier Interactive Patient Education © 2018 [employer]

## 2018-01-23 NOTE — Progress Notes (Signed)
Jonathon Bellows MD, MRCP(U.K) [address]  [place]  [place], [place] [address]  Main: [phone]  Fax: [phone]   Gastroenterology Consultation  Referring Provider:     Leone Haven, MD Primary Care Physician:  Leone Haven, MD Primary Gastroenterologist:  Dr. Jonathon Bellows  Reason for Consultation:     Positive Cologuard test        HPI:   Regina Nash is a 67 y.o. y/o female referred for consultation & management  by Dr. Caryl Bis, Angela Adam, MD.   She has been referred for a posiitve cologuard stool test .   She denies any change in bowel habits, no blood in stool. No family history of colon cancer or polyps.   Abdominal pain: Onset: few months - not changed, usually when she eats , within a few minutes of eating  Site :Epigastric  Radiation: llocalized , some pain in the Right of abdomen once a week , not aftrer meals , random  Severity :1-3/10  Nature of pain: burning  Aggravating factors: eating  Relieving factors :time  Weight loss: intentionally  NSAID use: none  PPI use :tums- helps the pain  Gall bladder surgery: none  Frequency of bowel movements: once every few days  Change in bowel movements: always had this issue  Relief with bowel movements: yes  Gas/Bloating/Abdominal distension: yes and associated with constipation.     CBC Latest Ref Rng & Units 12/12/2017 06/21/2016  WBC 4.0 - 10.5 K/uL 6.4 6.7  Hemoglobin 12.0 - 15.0 g/dL 14.7 14.6  Hematocrit 36.0 - 46.0 % 43.5 43.4  Platelets 150.0 - 400.0 K/uL 199.0 196.0         Past Medical History:  Diagnosis Date  . Allergy   . Arrhythmia   . Arthritis   . Asthma   . Chronic bronchitis ([place])   . Depression   . Genital warts   . GERD (gastroesophageal reflux disease)   . History of recurrent UTIs   . Hyperglycemia   . Migraine   . Sleep apnea   . Substance abuse ([place])   . Urine incontinence     Past Surgical History:  Procedure Laterality Date  . TUBAL LIGATION       Prior to Admission medications   Medication Sig Start Date End Date Taking? Authorizing Provider  calcium carbonate (TUMS - DOSED IN MG ELEMENTAL CALCIUM) 500 MG chewable tablet Chew 1 tablet by mouth daily.    [provider]  meloxicam (MOBIC) 15 MG tablet TAKE 1 TABLET BY MOUTH ONCE DAILY WITH FOOD 01/03/18   [provider]    Family History  Problem Relation Age of Onset  . Alcoholism Other        Parent  . Arthritis Other        Parent  . Cancer Other        Breast, ovarian/uterine  . Breast cancer Paternal Aunt        60's     Social History   Tobacco Use  . Smoking status: Current Every Day Smoker    Types: Cigarettes  . Smokeless tobacco: Never Used  Substance Use Topics  . Alcohol use: Yes  . Drug use: No    Allergies as of 01/23/2018 - Review Complete 12/20/2017  Allergen Reaction Noted  . Codeine Other (See Comments) 06/21/2016  . Erythromycin Other (See Comments) 06/21/2016    Review of Systems:    All systems reviewed and negative except where noted in HPI.  Physical Exam:  There were no vitals taken for this visit. No LMP recorded. Patient is postmenopausal. Psych:  Alert and cooperative. Normal mood and affect. General:   Alert,  Well-developed, well-nourished, pleasant and cooperative in NAD Head:  Normocephalic and atraumatic. Eyes:  Sclera clear, no icterus.   Conjunctiva pink. Ears:  Normal auditory acuity. Nose:  No deformity, discharge, or lesions. Mouth:  No deformity or lesions,oropharynx pink & moist. Neck:  Supple; no masses or thyromegaly. Lungs:  Respirations even and unlabored.  Clear throughout to auscultation.   No wheezes, crackles, or rhonchi. No acute distress. Heart:  Regular rate and rhythm; no murmurs, clicks, rubs, or gallops. Abdomen:  Normal bowel sounds.  No bruits.  Soft, non-tender and non-distended without masses, hepatosplenomegaly or hernias noted.  No guarding or rebound tenderness.    Msk:   Symmetrical without gross deformities. Good, equal movement & strength bilaterally. Pulses:  Normal pulses noted. Extremities:  No clubbing or edema.  No cyanosis. Neurologic:  Alert and oriented x3;  grossly normal neurologically. Skin:  Intact without significant lesions or rashes. No jaundice. Lymph Nodes:  No significant cervical adenopathy. Psych:  Alert and cooperative. Normal mood and affect.  Imaging Studies: No results found.  Assessment and Plan:   Regina Nash is a 67 y.o. y/o female has been referred for  a positive cologuard stool test. She also has epigastric pain which sounds like IBS-C. She may also have some dyspepsia.    Plan  1. Colonoscopy  2. Stool H pylori  3. Trial of Zantac 4. Linzess 145 mcg - 2 weeks sample provided.  5. If abdominal pain no better with Zantac and treatment of her constipation then EGD would be warranted at the same time of her colonoscopy    I have discussed alternative options, risks & benefits,  which include, but are not limited to, bleeding, infection, perforation,respiratory complication & drug reaction.  The patient agrees with this plan & written consent will be obtained.    Follow up in 8 weeks   Dr Jonathon Bellows MD,MRCP(U.K)

## 2018-01-24 ENCOUNTER — Telehealth: Payer: Self-pay | Admitting: Family Medicine

## 2018-01-24 NOTE — Telephone Encounter (Signed)
Copied from [place] [id number]. Topic: General - Other >> Jan 24, 2018 12:47 PM Lennox Solders wrote: Reason for CRM: [place] from dr Alvino Blood office is calling the patient is applying for short term disability and dr Alvino Blood would like to know if dr Caryl Bis would like to add anything in additional to medical records. Dr Marlon Pel can be reached [phone]

## 2018-01-24 NOTE — Telephone Encounter (Deleted)
Copied from [place] [id number]. Topic: General - Other >> Jan 24, 2018 12:47 PM Lennox Solders wrote: Reason for CRM: [place] from dr Alvino Blood office is calling the patient is applying for short term disability and dr Alvino Blood would like to know if dr Caryl Bis would like to add anything in additional to medical records. Dr Marlon Pel can be reached [phone]

## 2018-01-26 NOTE — Telephone Encounter (Signed)
We could provide our notes if needed. I have not seen her in close to two months so I have no update at this time.

## 2018-01-27 NOTE — Telephone Encounter (Signed)
Left message to return call, ok for pec to speak to Dr.Grattans office and inform we can fax notes if they would like. Please get fax number

## 2018-01-29 ENCOUNTER — Encounter: Payer: Self-pay | Admitting: Family Medicine

## 2018-01-30 ENCOUNTER — Ambulatory Visit: Payer: 59 | Admitting: Psychology

## 2018-02-10 ENCOUNTER — Ambulatory Visit: Payer: Medicare Other | Admitting: Psychology

## 2018-02-10 DIAGNOSIS — F332 Major depressive disorder, recurrent severe without psychotic features: Secondary | ICD-10-CM

## 2018-02-21 ENCOUNTER — Encounter: Payer: Self-pay | Admitting: Family Medicine

## 2018-02-21 ENCOUNTER — Telehealth: Payer: Self-pay | Admitting: Family Medicine

## 2018-02-21 DIAGNOSIS — S32010A Wedge compression fracture of first lumbar vertebra, initial encounter for closed fracture: Secondary | ICD-10-CM | POA: Insufficient documentation

## 2018-02-21 NOTE — Telephone Encounter (Signed)
Spoke with patient regarding her disability forms.  She reports she is continuing to have the same issues she was having previously where she is unable to sit or stand for long periods of time without pain.  She cannot lift, pull, or push anything without pain.  She saw orthopedics and reports that they have advised her not to lift anything weighing more than 10 pounds though anything more than 5 pounds causes her some discomfort in her back.  They are getting her fit for a back brace.  She reports they advised her the physical therapy did not cause any harm though did delay some healing.  They advised her not to bend at her waist.  Disability forms have been filled out based on our discussion.  They will be scanned into the chart.

## 2018-02-27 ENCOUNTER — Telehealth: Payer: Self-pay | Admitting: Gastroenterology

## 2018-02-27 NOTE — Telephone Encounter (Signed)
Pt would like to cancel procedure 03/04/18 due to insurance issues.

## 2018-02-28 DIAGNOSIS — M545 Low back pain: Secondary | ICD-10-CM | POA: Diagnosis not present

## 2018-02-28 NOTE — Telephone Encounter (Signed)
Patients colonoscopy has been canceled per patients request-insurance not covering. Procedure has been canceled with Trish in Endo.  Thanks [employer]

## 2018-03-03 ENCOUNTER — Encounter: Payer: Self-pay | Admitting: Family Medicine

## 2018-03-03 ENCOUNTER — Ambulatory Visit (INDEPENDENT_AMBULATORY_CARE_PROVIDER_SITE_OTHER): Payer: Medicare Other

## 2018-03-03 ENCOUNTER — Ambulatory Visit (INDEPENDENT_AMBULATORY_CARE_PROVIDER_SITE_OTHER): Payer: Medicare Other | Admitting: Family Medicine

## 2018-03-03 VITALS — BP 124/88 | HR 86 | Temp 98.5°F | Wt 179.0 lb

## 2018-03-03 DIAGNOSIS — R002 Palpitations: Secondary | ICD-10-CM | POA: Diagnosis not present

## 2018-03-03 DIAGNOSIS — M545 Low back pain, unspecified: Secondary | ICD-10-CM

## 2018-03-03 DIAGNOSIS — M542 Cervicalgia: Secondary | ICD-10-CM | POA: Diagnosis not present

## 2018-03-03 DIAGNOSIS — G4730 Sleep apnea, unspecified: Secondary | ICD-10-CM | POA: Diagnosis not present

## 2018-03-03 DIAGNOSIS — L858 Other specified epidermal thickening: Secondary | ICD-10-CM | POA: Diagnosis not present

## 2018-03-03 DIAGNOSIS — G8929 Other chronic pain: Secondary | ICD-10-CM

## 2018-03-03 DIAGNOSIS — M48061 Spinal stenosis, lumbar region without neurogenic claudication: Secondary | ICD-10-CM | POA: Diagnosis not present

## 2018-03-03 DIAGNOSIS — S32010D Wedge compression fracture of first lumbar vertebra, subsequent encounter for fracture with routine healing: Secondary | ICD-10-CM | POA: Diagnosis not present

## 2018-03-03 LAB — BASIC METABOLIC PANEL
BUN: 12 mg/dL (ref 6–23)
CO2: 31 meq/L (ref 19–32)
Calcium: 10 mg/dL (ref 8.4–10.5)
Chloride: 103 mEq/L (ref 96–112)
Creatinine, Ser: 1.1 mg/dL (ref 0.40–1.20)
GFR: 52.97 mL/min — ABNORMAL LOW (ref >60.00)
GLUCOSE: 112 mg/dL — AB (ref 70–99)
POTASSIUM: 4.7 meq/L (ref 3.5–5.1)
SODIUM: 139 meq/L (ref 135–145)

## 2018-03-03 LAB — TSH: TSH: 3.15 u[IU]/mL (ref 0.35–4.50)

## 2018-03-03 NOTE — Progress Notes (Signed)
Tommi Rumps, MD Phone: [phone]  Colton Engdahl is a 67 y.o. female who presents today for f/u.  CC: low back pain, palpitations, neck pain  Low back pain: Patient has been following with a spine specialist.  Previously found to have a compression fracture after an injury in December 2018.  She was doing physical therapy previously though this was not beneficial.  She noted previously that the orthopedic APP advised her that the physical therapy did not do any harm though possibly delayed her healing.  She has not been doing physical therapy for several months.  She notes her pain is stable.  She is going to get a back brace when her Medicare supplement kicks in.  She wonders about an x-ray to see if the compression fracture has healed as she reports the spine specialist that she was healed though did not obtain an x-ray.  She reports she has not heard about dermatology.  She was supposed to have a sleep study done though has not received a call on this.  Palpitations: Patient notes slight increase in palpitations recently.  Occur more in the afternoon to the evening.  Some activity brings it on.  She feels a flip-flopping in her chest.  No chest pain or breathing issues.  Neck pain: Patient notes for some time now she is felt a little sore in her lateral aspect of her neck on both sides.  Does not occur often.  Does not radiate down her arms.  She reports it typically occurs after she sleeps on her current pillow.  Social History   Tobacco Use  Smoking Status Current Every Day Smoker  . Types: Cigarettes  Smokeless Tobacco Never Used     ROS see history of present illness  Objective  Physical Exam Vitals:   03/03/18 1352  BP: 124/88  Pulse: 86  Temp: 98.5 F (36.9 C)  SpO2: 96%    BP Readings from Last 3 Encounters:  03/03/18 124/88  01/23/18 136/82  11/27/17 130/84   Wt Readings from Last 3 Encounters:  03/03/18 179 lb (81.2 kg)  01/23/18 180 lb 3.2 oz (81.7  kg)  11/27/17 184 lb (83.5 kg)    Physical Exam  Constitutional: No distress.  Cardiovascular: Normal rate, regular rhythm and normal heart sounds.  Pulmonary/Chest: Effort normal and breath sounds normal.  Musculoskeletal: She exhibits no edema.  No midline spine tenderness, no midline spine step-off, no muscular back tenderness, 5/5 strength bilateral quads, hamstrings, plantar flexion, and dorsiflexion, sensation light touch intact bilateral lower extremities, mild soreness on palpation of the sternocleidomastoid muscles in the lateral muscles in her neck bilaterally  Neurological: She is alert.  Skin: Skin is warm and dry. She is not diaphoretic.   EKG: Sinus rhythm, rate 76, no acute ischemic changes or arrhythmia noted  Assessment/Plan: Please see individual problem list.  Sleep apnea I will forward this note to our referral coordinator to get the patient set up for her sleep study.  Cutaneous horn I will forward this note to our referral coordinator to check on the patient's dermatology referral.  Palpitations We will check lab work to determine if there is an underlying cause.  We will refer to cardiology.  Given return precautions.  Compression fracture of first lumbar vertebra [facility]) Patient continues to have back pain.  She is seeing a spine specialist.  We will obtain an x-ray to evaluate the area again.  Neck pain Suspect muscular strain in neck.  Possibly related to how she sleeps.  If this bothers her more frequently or does not improve or resolve she will let us know.   Orders Placed This Encounter  Procedures  . DG Lumbar Spine Complete    Standing Status:   Future    Number of Occurrences:   1    Standing Expiration Date:   05/04/2019    Order Specific Question:   Reason for Exam (SYMPTOM  OR DIAGNOSIS REQUIRED)    Answer:   low back pain, history of compression fracture    Order Specific Question:   Preferred imaging location?    Answer:   [facility] Specific Question:   Radiology Contrast Protocol - do NOT remove file path    Answer:   [email or web address]  . Basic Metabolic Panel (BMET)  . TSH  . Ambulatory referral to Cardiology    Referral Priority:   Routine    Referral Type:   Consultation    Referral Reason:   Specialty Services Required    Requested Specialty:   Cardiology    Number of Visits Requested:   1  . EKG 12-Lead    No orders of the defined types were placed in this encounter.    Tommi Rumps, MD [place]

## 2018-03-03 NOTE — Patient Instructions (Signed)
Nice to see you. We will check some lab work today and contact you with the results. We will check into your dermatology appointment and sleep study. If you develop worsening back pain or persistent palpitations or you develop chest pain or shortness of breath please seek medical attention immediately.

## 2018-03-04 ENCOUNTER — Ambulatory Visit: Admit: 2018-03-04 | Payer: Managed Care, Other (non HMO) | Admitting: Gastroenterology

## 2018-03-04 DIAGNOSIS — G8929 Other chronic pain: Secondary | ICD-10-CM | POA: Insufficient documentation

## 2018-03-04 DIAGNOSIS — M542 Cervicalgia: Secondary | ICD-10-CM | POA: Insufficient documentation

## 2018-03-04 SURGERY — COLONOSCOPY WITH PROPOFOL
Anesthesia: General

## 2018-03-04 NOTE — Assessment & Plan Note (Signed)
Patient continues to have back pain.  She is seeing a spine specialist.  We will obtain an x-ray to evaluate the area again.

## 2018-03-04 NOTE — Assessment & Plan Note (Signed)
Suspect muscular strain in neck.  Possibly related to how she sleeps.  If this bothers her more frequently or does not improve or resolve she will let us know.

## 2018-03-04 NOTE — Assessment & Plan Note (Signed)
We will check lab work to determine if there is an underlying cause.  We will refer to cardiology.  Given return precautions.

## 2018-03-04 NOTE — Assessment & Plan Note (Signed)
I will forward this note to our referral coordinator to check on the patient's dermatology referral.

## 2018-03-04 NOTE — Assessment & Plan Note (Signed)
I will forward this note to our referral coordinator to get the patient set up for her sleep study.

## 2018-03-05 ENCOUNTER — Telehealth: Payer: Self-pay

## 2018-03-05 DIAGNOSIS — N179 Acute kidney failure, unspecified: Secondary | ICD-10-CM

## 2018-03-05 DIAGNOSIS — R7309 Other abnormal glucose: Secondary | ICD-10-CM

## 2018-03-05 NOTE — Telephone Encounter (Signed)
-----   Message from Leone Haven, MD sent at 03/04/2018  2:26 PM EDT ----- Please let the patient know that her lab work did not reveal a cause for her palpitations.  Her kidney function was minimally worse than prior.  I would like to recheck this along with an A1c given her elevated glucose sometime in the next 1 to 2 weeks.  Please place an order for a BMP for AKI and A1c for elevated glucose.  I have additionally referred her to cardiology given her palpitations.

## 2018-03-06 ENCOUNTER — Telehealth: Payer: Self-pay

## 2018-03-06 NOTE — Telephone Encounter (Signed)
Copied from [place] [phone]. Topic: General - Other >> Mar 06, 2018  3:56 PM Cecelia Byars, NT wrote: Reason for CRM: Patient called and said [name] is requesting notes from her latest visit faxed over pertaining to her back injury  ,please call once this is done at  [phone]

## 2018-03-07 NOTE — Telephone Encounter (Signed)
faxed

## 2018-03-11 ENCOUNTER — Encounter: Payer: Self-pay | Admitting: Family Medicine

## 2018-03-14 DIAGNOSIS — L820 Inflamed seborrheic keratosis: Secondary | ICD-10-CM | POA: Diagnosis not present

## 2018-03-14 DIAGNOSIS — L570 Actinic keratosis: Secondary | ICD-10-CM | POA: Diagnosis not present

## 2018-03-14 DIAGNOSIS — L821 Other seborrheic keratosis: Secondary | ICD-10-CM | POA: Diagnosis not present

## 2018-03-18 ENCOUNTER — Ambulatory Visit (INDEPENDENT_AMBULATORY_CARE_PROVIDER_SITE_OTHER): Payer: Medicare Other | Admitting: Psychology

## 2018-03-18 ENCOUNTER — Other Ambulatory Visit (INDEPENDENT_AMBULATORY_CARE_PROVIDER_SITE_OTHER): Payer: Medicare Other

## 2018-03-18 DIAGNOSIS — N179 Acute kidney failure, unspecified: Secondary | ICD-10-CM | POA: Diagnosis not present

## 2018-03-18 DIAGNOSIS — R7309 Other abnormal glucose: Secondary | ICD-10-CM | POA: Diagnosis not present

## 2018-03-18 DIAGNOSIS — F332 Major depressive disorder, recurrent severe without psychotic features: Secondary | ICD-10-CM

## 2018-03-19 LAB — BASIC METABOLIC PANEL
BUN: 14 mg/dL (ref 6–23)
CALCIUM: 9.6 mg/dL (ref 8.4–10.5)
CO2: 30 meq/L (ref 19–32)
CREATININE: 1.11 mg/dL (ref 0.40–1.20)
Chloride: 102 mEq/L (ref 96–112)
GFR: 52.41 mL/min — ABNORMAL LOW (ref >60.00)
GLUCOSE: 70 mg/dL (ref 70–99)
Potassium: 4.4 mEq/L (ref 3.5–5.1)
Sodium: 139 mEq/L (ref 135–145)

## 2018-03-19 LAB — HEMOGLOBIN A1C: Hgb A1c MFr Bld: 5.8 % (ref 4.6–6.5)

## 2018-03-20 ENCOUNTER — Other Ambulatory Visit: Payer: Self-pay | Admitting: Radiology

## 2018-03-20 DIAGNOSIS — N179 Acute kidney failure, unspecified: Secondary | ICD-10-CM

## 2018-03-20 DIAGNOSIS — G4733 Obstructive sleep apnea (adult) (pediatric): Secondary | ICD-10-CM | POA: Diagnosis not present

## 2018-03-20 DIAGNOSIS — R0602 Shortness of breath: Secondary | ICD-10-CM | POA: Diagnosis not present

## 2018-03-21 DIAGNOSIS — R0602 Shortness of breath: Secondary | ICD-10-CM | POA: Diagnosis not present

## 2018-03-21 DIAGNOSIS — G4733 Obstructive sleep apnea (adult) (pediatric): Secondary | ICD-10-CM | POA: Diagnosis not present

## 2018-03-25 ENCOUNTER — Telehealth: Payer: Self-pay

## 2018-03-25 NOTE — Telephone Encounter (Signed)
Copied from [place] [phone]. Topic: General - Other >> Mar 24, 2018 12:52 PM Regina Nash wrote:  Pt call to say [name] is requesting that Dr Caryl Bis write a letter stating when he first saw her for her back injury. I ask her where would he need to send that to and she said he has theumber he has sent in several thing for her

## 2018-03-25 NOTE — Telephone Encounter (Signed)
Copied from [place] [phone]. Topic: Quick Communication - See Telephone Encounter >> Mar 25, 2018  2:38 PM Antonieta Iba C wrote: CRM for notification. See Telephone encounter for: 03/25/18.  Pt called in, returning Rasheeda's call.

## 2018-03-27 ENCOUNTER — Ambulatory Visit (INDEPENDENT_AMBULATORY_CARE_PROVIDER_SITE_OTHER): Payer: Medicare Other | Admitting: Gastroenterology

## 2018-03-27 ENCOUNTER — Ambulatory Visit: Payer: Medicare Other | Admitting: Psychology

## 2018-03-27 ENCOUNTER — Other Ambulatory Visit: Payer: Self-pay

## 2018-03-27 ENCOUNTER — Encounter: Payer: Self-pay | Admitting: Gastroenterology

## 2018-03-27 ENCOUNTER — Telehealth: Payer: Self-pay | Admitting: Family Medicine

## 2018-03-27 VITALS — BP 127/81 | HR 79 | Ht 65.0 in | Wt 180.0 lb

## 2018-03-27 DIAGNOSIS — R1013 Epigastric pain: Secondary | ICD-10-CM

## 2018-03-27 NOTE — Progress Notes (Signed)
Pt would like to speak to a nurse regarding lab results pt is confused as to why she needs to see Dr Caryl Bis if lab results are normal, Please advise? Call pt after 2 pm [phone]. Thank you!

## 2018-03-27 NOTE — Telephone Encounter (Signed)
Called Pt, she will be here on 04/08/2018 to discuss lab results with Dr. Caryl Bis

## 2018-03-27 NOTE — Telephone Encounter (Signed)
Pt would like to speak to a nurse regarding lab results pt is confused as to why she needs to see Dr Caryl Bis if lab results are normal, Please advise? Call pt after 2 pm [phone]. Thank you!

## 2018-03-27 NOTE — Progress Notes (Signed)
Regina Bellows MD, MRCP(U.K) [address]  [place]  [place], [place] [address]  Main: [phone]  Fax: [phone]   Primary Care Physician: Regina Haven, MD  Primary Gastroenterologist:  Dr. Jonathon Nash   No chief complaint on file.   HPI: Regina Nash is a 67 y.o. female  Summary of history :  She was seen on 01/23/18 for a positive cologuard test and at that time she had some epigastric pain. Plan was a trial of Zantac, check for H pylori , colonoscopy.   Interval history   01/23/2018-  03/27/2018  Cancelled her colonoscopy as she changed her mind .  Says some epigastric discomfort, some extra beats of her heart , due to see cardiuologist end of October.  Current Outpatient Medications  Medication Sig Dispense Refill  . calcium carbonate (TUMS - DOSED IN MG ELEMENTAL CALCIUM) 500 MG chewable tablet Chew 1 tablet by mouth daily.     No current facility-administered medications for this visit.     Allergies as of 03/27/2018 - Review Complete 03/03/2018  Allergen Reaction Noted  . Codeine Other (See Comments) 06/21/2016  . Erythromycin Other (See Comments) 06/21/2016    ROS:  General: Negative for anorexia, weight loss, fever, chills, fatigue, weakness. ENT: Negative for hoarseness, difficulty swallowing , nasal congestion. CV: Negative for chest pain, angina, palpitations, dyspnea on exertion, peripheral edema.  Respiratory: Negative for dyspnea at rest, dyspnea on exertion, cough, sputum, wheezing.  GI: See history of present illness. GU:  Negative for dysuria, hematuria, urinary incontinence, urinary frequency, nocturnal urination.  Endo: Negative for unusual weight change.    Physical Examination:   There were no vitals taken for this visit.  General: Well-nourished, well-developed in no acute distress.  Eyes: No icterus. Conjunctivae pink. Mouth: Oropharyngeal mucosa moist and pink , no lesions erythema or exudate. Lungs: Clear to auscultation  bilaterally. Non-labored. Heart: Regular rate and rhythm, no murmurs rubs or gallops.  Abdomen: Bowel sounds are normal, nontender, nondistended, no hepatosplenomegaly or masses, no abdominal bruits or hernia , no rebound or guarding.   Extremities: No lower extremity edema. No clubbing or deformities. Neuro: Alert and oriented x 3.  Grossly intact. Skin: Warm and dry, no jaundice.   Psych: Alert and cooperative, normal mood and affect.   Imaging Studies: Dg Lumbar Spine Complete  Result Date: 03/03/2018 CLINICAL DATA:  History of lumbar fracture. The patient reports falling last December. Recently the patient developed pain in the low back. EXAM: LUMBAR SPINE - COMPLETE 4+ VIEW COMPARISON:  MRI of the lumbar spine of December 17, 2017 FINDINGS: There is chronic mild superior compression of the body of L1 which is stable. There is stable mild disc space narrowing at L3-4 and to a lesser extent at L4-5. There is facet joint hypertrophy at L4-5 and L5-S1. There is no spondylolisthesis. The pedicles and transverse processes are intact. There is mild curvature convex toward the right centered at L3-4. IMPRESSION: Stable partial compression of the body of L1. Stable mild degenerative disc space narrowing at L3-4 and L4-5. No spondylolisthesis. Electronically Signed   By: David  [place] M.D.   On: 03/03/2018 14:49    Assessment and Plan:   Regina Nash is a 67 y.o. y/o female here to see me for a positive cologuard test. She was not too keen on a colonoscopy, I explained that a positive test should be evaluated and wouldn't strongly advise against not proceeding with a colonoscopy. She consented. She also has episodic epigastric  pain which will proceed with an EGD.   Plan  1. H pylori breath test  2. EGD+colonoscopy on Wednesday PM in November after cardiology clearance for irregular heart beat she experiences.    I have discussed alternative options, risks & benefits,  which include, but are not limited  to, bleeding, infection, perforation,respiratory complication & drug reaction.  The patient agrees with this plan & written consent will be obtained.    Dr Regina Bellows  MD,MRCP [facility]) Follow up in 3 months

## 2018-03-29 LAB — H. PYLORI BREATH TEST: H pylori Breath Test: NEGATIVE

## 2018-03-30 ENCOUNTER — Encounter: Payer: Self-pay | Admitting: Gastroenterology

## 2018-03-31 DIAGNOSIS — M545 Low back pain: Secondary | ICD-10-CM | POA: Diagnosis not present

## 2018-03-31 DIAGNOSIS — M4696 Unspecified inflammatory spondylopathy, lumbar region: Secondary | ICD-10-CM | POA: Diagnosis not present

## 2018-04-01 NOTE — Telephone Encounter (Signed)
Letter created.  Please print and place on my desk for me to sign.  Thanks.

## 2018-04-02 NOTE — Telephone Encounter (Signed)
Called and spoke with pt. Pt advised and voiced understanding. Placed up front for pick up.

## 2018-04-03 ENCOUNTER — Telehealth: Payer: Self-pay | Admitting: Family Medicine

## 2018-04-03 DIAGNOSIS — G4733 Obstructive sleep apnea (adult) (pediatric): Secondary | ICD-10-CM

## 2018-04-03 NOTE — Telephone Encounter (Signed)
Please let the patient know that her sleep study reveals that she may have sleep apnea.  They recommended a CPAP titration study.  I would like to refer her to pulmonology to get this set up and help manage CPAP if needed.  If she is willing I can place a referral.  Thanks.

## 2018-04-03 NOTE — Telephone Encounter (Signed)
Called and spoke with pt. Pt advised and voiced understanding. Pt would like the referral placed. Sent to PCP.

## 2018-04-04 DIAGNOSIS — L820 Inflamed seborrheic keratosis: Secondary | ICD-10-CM | POA: Diagnosis not present

## 2018-04-04 DIAGNOSIS — D489 Neoplasm of uncertain behavior, unspecified: Secondary | ICD-10-CM | POA: Diagnosis not present

## 2018-04-04 DIAGNOSIS — L570 Actinic keratosis: Secondary | ICD-10-CM | POA: Diagnosis not present

## 2018-04-04 DIAGNOSIS — B078 Other viral warts: Secondary | ICD-10-CM | POA: Diagnosis not present

## 2018-04-04 NOTE — Addendum Note (Signed)
Addended by: Leone Haven on: 04/04/2018 08:14 AM   Modules accepted: Orders

## 2018-04-04 NOTE — Telephone Encounter (Signed)
Referral placed.

## 2018-04-08 ENCOUNTER — Ambulatory Visit (INDEPENDENT_AMBULATORY_CARE_PROVIDER_SITE_OTHER): Payer: Medicare Other | Admitting: Family Medicine

## 2018-04-08 ENCOUNTER — Encounter: Payer: Self-pay | Admitting: Family Medicine

## 2018-04-08 VITALS — BP 118/72 | HR 80 | Temp 98.2°F | Ht 65.0 in | Wt 180.0 lb

## 2018-04-08 DIAGNOSIS — N183 Chronic kidney disease, stage 3 unspecified: Secondary | ICD-10-CM

## 2018-04-08 NOTE — Progress Notes (Signed)
  Tommi Rumps, MD Phone: [phone]  Regina Nash is a 67 y.o. female who presents today for f/u.  CC: ckd stage 3  CKD stage III: Noted on recent lab work.  She notes no NSAID use.  She drinks about 20 ounces of water a day and drinks a lot of caffeinated beverages.  Notes intermittent kidney pain for 20+ years though this has not occurred in some time.  No blood in her urine.  No frothy urine.  No kidney stone history.  Social History   Tobacco Use  Smoking Status Current Every Day Smoker  . Types: Cigarettes  Smokeless Tobacco Never Used     ROS see history of present illness  Objective  Physical Exam Vitals:   04/08/18 1450  BP: 118/72  Pulse: 80  Temp: 98.2 F (36.8 C)  SpO2: 94%    BP Readings from Last 3 Encounters:  04/08/18 118/72  03/27/18 127/81  03/03/18 124/88   Wt Readings from Last 3 Encounters:  04/08/18 180 lb (81.6 kg)  03/27/18 180 lb (81.6 kg)  03/03/18 179 lb (81.2 kg)    Physical Exam  Constitutional: No distress.  Cardiovascular: Normal rate, regular rhythm and normal heart sounds.  Pulmonary/Chest: Effort normal and breath sounds normal.  Abdominal:  No CVA tenderness  Musculoskeletal: She exhibits no edema.  Neurological: She is alert.  Skin: Skin is warm and dry. She is not diaphoretic.     Assessment/Plan: Please see individual problem list.  CKD (chronic kidney disease) stage 3, GFR 30-59 ml/min (HCC) Noted on labs.  Has been persistent for greater than 3 months.  We will obtain an ultrasound and urine protein testing.  She will avoid NSAIDs.  Periodically monitor renal function.    Orders Placed This Encounter  Procedures  . US Renal    Standing Status:   Future    Standing Expiration Date:   06/09/2019    Order Specific Question:   Reason for Exam (SYMPTOM  OR DIAGNOSIS REQUIRED)    Answer:   ckd stage 3    Order Specific Question:   Preferred imaging location?    Answer:   [facility]  . Protein /  creatinine ratio, urine    No orders of the defined types were placed in this encounter.    Tommi Rumps, MD [place]

## 2018-04-08 NOTE — Assessment & Plan Note (Signed)
Noted on labs.  Has been persistent for greater than 3 months.  We will obtain an ultrasound and urine protein testing.  She will avoid NSAIDs.  Periodically monitor renal function.

## 2018-04-08 NOTE — Patient Instructions (Signed)
Nice to see you. We will get an ultrasound set up for you and check your urine for protein today. We will determine the next step after these return.

## 2018-04-09 LAB — PROTEIN / CREATININE RATIO, URINE
Creatinine, Urine: 132 mg/dL (ref 20–275)
Protein/Creat Ratio: 68 mg/g creat (ref 21–161)
Total Protein, Urine: 9 mg/dL (ref 5–24)

## 2018-04-10 ENCOUNTER — Ambulatory Visit: Payer: Medicare Other | Admitting: Psychology

## 2018-04-11 ENCOUNTER — Telehealth: Payer: Self-pay | Admitting: Family Medicine

## 2018-04-11 DIAGNOSIS — M4696 Unspecified inflammatory spondylopathy, lumbar region: Secondary | ICD-10-CM | POA: Diagnosis not present

## 2018-04-11 DIAGNOSIS — M545 Low back pain: Secondary | ICD-10-CM | POA: Diagnosis not present

## 2018-04-11 NOTE — Telephone Encounter (Signed)
PT dropped off a letter from [place] about her long term disability. She needs a statement from Dr. Caryl Bis that pt was still out of work on 9/13/209, letter must be on [place] letter head. Also states [employer] would like an estimated time when pt can return back to work. Letter needs to be done by Tuesday, Oct 1st. Can be faxed. Copy of the letter is up front in Dr.Sonnenberg's color folder.

## 2018-04-14 ENCOUNTER — Encounter: Payer: Self-pay | Admitting: Internal Medicine

## 2018-04-14 ENCOUNTER — Ambulatory Visit (INDEPENDENT_AMBULATORY_CARE_PROVIDER_SITE_OTHER): Payer: Medicare Other | Admitting: Internal Medicine

## 2018-04-14 VITALS — BP 110/66 | HR 96 | Ht 65.0 in

## 2018-04-14 DIAGNOSIS — G4719 Other hypersomnia: Secondary | ICD-10-CM | POA: Diagnosis not present

## 2018-04-14 DIAGNOSIS — G4721 Circadian rhythm sleep disorder, delayed sleep phase type: Secondary | ICD-10-CM | POA: Diagnosis not present

## 2018-04-14 NOTE — Telephone Encounter (Signed)
Letter created advising that she was to be out of work on September 13.  It appears that there is a physician certification or healthcare provider form that needs to be filled out as well.  We will need those forms.  She really should be reevaluated in the office to determine whether or not she can return to work.  If she is willing to come in we can find an appointment time for her.  Thanks.

## 2018-04-14 NOTE — Telephone Encounter (Signed)
Paper work place for provider to review.

## 2018-04-14 NOTE — Progress Notes (Signed)
[facility] Consultation      Assessment and Plan:  Excessive daytime sleepiness.  -Symptoms and signs of obstructive sleep apnea. -We will send for sleep study, start a CPAP as appropriate.  Circadian rhythm disorder-delayed sleep phase. 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 - We discussed potentially starting a sleep restriction therapy, when she has started on CPAP and is tolerating it.  Orders Placed This Encounter  Procedures  . Home sleep test   Return in about 3 months (around 07/14/2018).   Date: 04/14/2018  MRN# [id number] Regina Nash 03-05-1951    Regina Nash is a 67 y.o. old female seen in consultation for chief complaint of:    Chief Complaint  Patient presents with  . Consult    Referred by Dr.Sonnenberg for eval of OSA:    HPI:   The patient is a 67 year old female with a history of chronic bronchitis, GERD, sleep apnea.  She presents with symptoms of excessive daytime sleepiness.  Epworth score 7 today she usually goes to bed between 4 and 8 AM, she falls asleep within 1 to 2 hours.  She gets out of bed between 1230 and 2 PM. She referred for a sleep study about 15 years ago, but did not go because of the cost. She wakes up choking, she hears herself snoring, she is sleepy during the day. She used to work multiple jobs which kept her "up till the wee hours". She has continued to work evening jobs until then and she maintains those hours now.  She can not sleep on her back or on her right side because her throat closes.    PMHX:   Past Medical History:  Diagnosis Date  . Allergy   . Arrhythmia   . Arthritis   . Asthma   . Chronic bronchitis ([place])   . Depression   . Genital warts   . GERD (gastroesophageal reflux disease)   . History of recurrent UTIs   . Hyperglycemia   . Migraine   .  Sleep apnea   . Substance abuse ([place])   . Urine incontinence    Surgical Hx:  Past Surgical History:  Procedure Laterality Date  . TUBAL LIGATION     Family Hx:  Family History  Problem Relation Age of Onset  . Alcoholism Other        Parent  . Arthritis Other        Parent  . Cancer Other        Breast, ovarian/uterine  . Breast cancer Paternal Aunt        60's   Social Hx:   Social History   Tobacco Use  . Smoking status: Current Every Day Smoker    Types: Cigarettes  . Smokeless tobacco: Never Used  Substance Use Topics  . Alcohol use: Yes  . Drug use: No   Medication:    Current Outpatient Medications:  .  calcium carbonate (TUMS - DOSED IN MG ELEMENTAL CALCIUM) 500 MG chewable tablet, Chew 1 tablet by mouth daily., Disp: , Rfl:  .  ketoconazole (NIZORAL) 2 % shampoo, Apply 1 application topically 2 (two) times daily., Disp: , Rfl:    Allergies:  Codeine and Erythromycin  Review of Systems: Gen:  Denies  fever, sweats, chills HEENT: Denies blurred vision, double vision. bleeds, sore throat Cvc:  No  dizziness, chest pain. Resp:   Denies cough or sputum production, shortness of breath Gi: Denies swallowing difficulty, stomach pain. Gu:  Denies bladder incontinence, burning urine Ext:   No Joint pain, stiffness. Skin: No skin rash,  hives  Endoc:  No polyuria, polydipsia. Psych: No depression, insomnia. Other:  All other systems were reviewed with the patient and were negative other that what is mentioned in the HPI.   Physical Examination:   VS: BP 110/66 (BP Location: Left Arm, Cuff Size: Normal)   Pulse 96   Ht 5\' 5"  (1.651 m)   SpO2 95%   BMI 29.95 kg/m   General Appearance: No distress  Neuro:without focal findings,  speech normal,  HEENT: PERRLA, EOM intact.   Pulmonary: normal breath sounds, No wheezing.  CardiovascularNormal S1,S2.  No m/r/g.   Abdomen: Benign, Soft, non-tender. Renal:  No costovertebral tenderness  GU:  No performed at  this time. Endoc: No evident thyromegaly, no signs of acromegaly. Skin:   warm, no rashes, no ecchymosis  Extremities: normal, no cyanosis, clubbing.  Other findings:    LABORATORY PANEL:   CBC No results for input(s): WBC, HGB, HCT, PLT in the last 168 hours. ------------------------------------------------------------------------------------------------------------------  Chemistries  No results for input(s): NA, K, CL, CO2, GLUCOSE, BUN, CREATININE, CALCIUM, MG, AST, ALT, ALKPHOS, BILITOT in the last 168 hours.  Invalid input(s): GFRCGP ------------------------------------------------------------------------------------------------------------------  Cardiac Enzymes No results for input(s): TROPONINI in the last 168 hours. ------------------------------------------------------------  RADIOLOGY:  No results found.     Thank  you for the consultation and for allowing [facility] to assist in the care of your patient. Our recommendations are noted above.  Please contact us if we can be of further service.   Marda Stalker, M.D., F.C.C.P.  Board Certified in Internal Medicine, Pulmonary Medicine, [place], and Sleep Medicine.  [facility] Office Number: [phone]   04/14/2018

## 2018-04-14 NOTE — Patient Instructions (Addendum)

## 2018-04-15 ENCOUNTER — Telehealth: Payer: Self-pay

## 2018-04-15 NOTE — Telephone Encounter (Signed)
Copied from [place] [id number]. Topic: General - Other >> Apr 15, 2018  1:46 PM Sheran Luz wrote: Reason for CRM: Pt called requesting to reschedule her appointment on 10/8 stating that she does not think she will be able to meet her works deadline with the evaluation paper due to office paperwork turnaround time. She states that her job needs the paper on 10/8. Pt would like to know if she can reschedule for sooner (no availability) or if it can be guaranteed that the paper can be faxed the same day after office visit.

## 2018-04-15 NOTE — Telephone Encounter (Signed)
Called and spoke with pt. Pt advised and voiced understanding. Pt stated that Dr. Caryl Bis does NOT need to fill out the physician certification or healthcare provider form pt stated that this is not needed.   Pt did agree to an appt. I am unable to schedule pt for same day office visit.   Pt needs to be scheduled for 10/8 @ 3:00 PM   Please send back to me once scheduled   Thanks

## 2018-04-15 NOTE — Telephone Encounter (Signed)
Patient has been scheduled

## 2018-04-16 ENCOUNTER — Ambulatory Visit
Admission: RE | Admit: 2018-04-16 | Discharge: 2018-04-16 | Disposition: A | Discharge status: Home / Self Care | Payer: Medicare Other | Source: Ambulatory Visit | Attending: Family Medicine | Admitting: Family Medicine

## 2018-04-16 DIAGNOSIS — N189 Chronic kidney disease, unspecified: Secondary | ICD-10-CM | POA: Diagnosis not present

## 2018-04-16 DIAGNOSIS — N183 Chronic kidney disease, stage 3 unspecified: Secondary | ICD-10-CM

## 2018-04-16 NOTE — Telephone Encounter (Signed)
Sent to PCP please advise.  

## 2018-04-16 NOTE — Telephone Encounter (Signed)
Appointment for 10/8 has been canceled. Called pt and left a VM to call back to work her on his schedule for Monday 10/7 @ 4:30 PM. CRM created and sent to [facility].

## 2018-04-16 NOTE — Telephone Encounter (Signed)
I could see her on Monday at 430.  That would provide ample time to complete the paperwork and fax it by Tuesday.  Thanks.

## 2018-04-17 ENCOUNTER — Ambulatory Visit (INDEPENDENT_AMBULATORY_CARE_PROVIDER_SITE_OTHER): Payer: Medicare Other | Admitting: Psychology

## 2018-04-17 DIAGNOSIS — F332 Major depressive disorder, recurrent severe without psychotic features: Secondary | ICD-10-CM | POA: Diagnosis not present

## 2018-04-17 NOTE — Telephone Encounter (Signed)
Patient notified of Monday appointment at 4:30 .

## 2018-04-17 NOTE — Telephone Encounter (Signed)
Called pt and left a VM to call back today ASAP.

## 2018-04-18 DIAGNOSIS — M545 Low back pain: Secondary | ICD-10-CM | POA: Diagnosis not present

## 2018-04-21 ENCOUNTER — Encounter: Payer: Self-pay | Admitting: Family Medicine

## 2018-04-21 ENCOUNTER — Ambulatory Visit (INDEPENDENT_AMBULATORY_CARE_PROVIDER_SITE_OTHER): Payer: Medicare Other | Admitting: Family Medicine

## 2018-04-21 DIAGNOSIS — N183 Chronic kidney disease, stage 3 unspecified: Secondary | ICD-10-CM

## 2018-04-21 DIAGNOSIS — S32010D Wedge compression fracture of first lumbar vertebra, subsequent encounter for fracture with routine healing: Secondary | ICD-10-CM

## 2018-04-21 NOTE — Assessment & Plan Note (Signed)
Work-up unremarkable.  Reinforced avoiding NSAIDs.  She will try to stay adequately hydrated.  We will monitor her numbers and if they trend down refer to nephrology.

## 2018-04-21 NOTE — Assessment & Plan Note (Signed)
Pain seems to be improving with some physical therapy.  It appears her orthopedist wanted her out for 4 to 6 weeks from their last visit.  A new letter will be created reflecting this.  She will need to be released back to work by her orthopedist.

## 2018-04-21 NOTE — Patient Instructions (Signed)
Nice to see you.  We will have your letter done and sent to [employer] tomorrow.  Please avoid anti-inflammatories such as ibuprofen and aleve.

## 2018-04-21 NOTE — Progress Notes (Signed)
  Tommi Rumps, MD Phone: [phone]  Regina Nash is a 67 y.o. female who presents today for f/u.  CC: low back pain, ckd stage 3  Patient notes she has had some improvement in her low back pain.  She has been doing physical therapy through her orthopedist since September 16.  Notes this has helped some with 2 sessions.  She continues to have some chronic numbness on the outside of her left leg.  This has been going on since her injury.  No pain.  No incontinence.  No saddle anesthesia.  She notes they advised that she be out of work 4 to 6 weeks.  CKD stage III: Noted on recent lab work.  She had an ultrasound that was unremarkable.  Urine protein in the normal range.  She does note occasionally in the past she has had discomfort in her kidney area.  She become weak and nauseated when this occurred.  It has not occurred in some time.  No history of kidney stones.  Social History   Tobacco Use  Smoking Status Current Every Day Smoker  . Types: Cigarettes  Smokeless Tobacco Never Used     ROS see history of present illness  Objective  Physical Exam Vitals:   04/21/18 1647  BP: 118/70  Pulse: 88  Temp: 98.5 F (36.9 C)  SpO2: 95%    BP Readings from Last 3 Encounters:  04/21/18 118/70  04/14/18 110/66  04/08/18 118/72   Wt Readings from Last 3 Encounters:  04/21/18 181 lb 6.4 oz (82.3 kg)  04/08/18 180 lb (81.6 kg)  03/27/18 180 lb (81.6 kg)    Physical Exam  Constitutional: No distress.  Cardiovascular: Normal rate, regular rhythm and normal heart sounds.  Pulmonary/Chest: Effort normal and breath sounds normal.  Musculoskeletal: She exhibits no edema.  Mild tenderness of the lumbar musculature with slight midline tenderness in the lumbar region, no overlying skin changes  Neurological: She is alert.  5/5 strength bilateral quads, hamstrings, plantar flexion, and dorsiflexion, sensation light touch intact bilateral lower extremities  Skin: Skin is warm and  dry. She is not diaphoretic.     Assessment/Plan: Please see individual problem list.  CKD (chronic kidney disease) stage 3, GFR 30-59 ml/min (HCC) Work-up unremarkable.  Reinforced avoiding NSAIDs.  She will try to stay adequately hydrated.  We will monitor her numbers and if they trend down refer to nephrology.  Compression fracture of first lumbar vertebra (HCC) Pain seems to be improving with some physical therapy.  It appears her orthopedist wanted her out for 4 to 6 weeks from their last visit.  A new letter will be created reflecting this.  She will need to be released back to work by her orthopedist.   No orders of the defined types were placed in this encounter.   No orders of the defined types were placed in this encounter.    Tommi Rumps, MD [place]

## 2018-04-22 ENCOUNTER — Ambulatory Visit: Payer: Self-pay | Admitting: Family Medicine

## 2018-04-22 DIAGNOSIS — M545 Low back pain: Secondary | ICD-10-CM | POA: Diagnosis not present

## 2018-04-24 ENCOUNTER — Telehealth: Payer: Self-pay | Admitting: Family Medicine

## 2018-04-24 DIAGNOSIS — M545 Low back pain: Secondary | ICD-10-CM | POA: Diagnosis not present

## 2018-04-24 NOTE — Telephone Encounter (Signed)
Copied from [place] [id number]. Topic: Quick Communication - See Telephone Encounter >> Apr 24, 2018  5:14 PM Rutherford Nail, [place] wrote: CRM for notification. See Telephone encounter for: 04/24/18. Alice with [employer] and would like to know when the patient is to return to work, whether it be with restrictions or not. Pleas eadvise.  CB#: [phone]

## 2018-04-28 DIAGNOSIS — M545 Low back pain: Secondary | ICD-10-CM | POA: Diagnosis not present

## 2018-04-28 NOTE — Telephone Encounter (Signed)
Her return to work should be determined by her orthopedist.

## 2018-04-28 NOTE — Telephone Encounter (Signed)
Sent to PCP please advise when patient is to return to work.   Thanks

## 2018-04-29 NOTE — Telephone Encounter (Signed)
Called Alice and left a detailed VM with pt's name and DOB advising Alice that the patient orthopedist doctor is the one to determine when she will be going back to work.

## 2018-05-01 DIAGNOSIS — M545 Low back pain: Secondary | ICD-10-CM | POA: Diagnosis not present

## 2018-05-05 ENCOUNTER — Ambulatory Visit (INDEPENDENT_AMBULATORY_CARE_PROVIDER_SITE_OTHER): Payer: Medicare Other | Admitting: Psychology

## 2018-05-05 DIAGNOSIS — F332 Major depressive disorder, recurrent severe without psychotic features: Secondary | ICD-10-CM

## 2018-05-06 DIAGNOSIS — M545 Low back pain: Secondary | ICD-10-CM | POA: Diagnosis not present

## 2018-05-08 DIAGNOSIS — M545 Low back pain: Secondary | ICD-10-CM | POA: Diagnosis not present

## 2018-05-09 DIAGNOSIS — G4733 Obstructive sleep apnea (adult) (pediatric): Secondary | ICD-10-CM | POA: Diagnosis not present

## 2018-05-13 DIAGNOSIS — E785 Hyperlipidemia, unspecified: Secondary | ICD-10-CM | POA: Insufficient documentation

## 2018-05-13 NOTE — Progress Notes (Signed)
Cardiology Office Note  Date:  05/14/2018   ID:  Balbina, Depace 02/06/1951, MRN [id number]  PCP:  Leone Haven, MD   Chief Complaint  Patient presents with  . other    palpatations.Medications reviewed verbally.     HPI:  [name] is a 67 year old woman with past medical history of Smoking Hyperlipidemia Referred by Dr. Biagio Quint for consultation of her palpitations  Reports significant stress in her life, some marital issues Stress when she gets home Has been noticing palpitations for many years, sometimes worse than other times  Occur more in the afternoon to the evening.  Describes it as a flip-flop pain No significant chest pain or shortness of breath Working on the stress Often has palpitations when she is going to sleep  No regular exercise program  Lab work reviewed Hemoglobin A1c 5.8 Total cholesterol 222, LDL 142  EKG personally reviewed by myself on todays visit Shows normal sinus rhythm with rate 77 bpm, unable to exclude old inferior MI   PMH:   has a past medical history of Allergy, Arrhythmia, Arthritis, Asthma, Chronic bronchitis ([place]), Depression, Genital warts, GERD (gastroesophageal reflux disease), History of recurrent UTIs, Hyperglycemia, Migraine, Sleep apnea, Substance abuse ([place]), and Urine incontinence.  PSH:    Past Surgical History:  Procedure Laterality Date  . TUBAL LIGATION      Current Outpatient Medications  Medication Sig Dispense Refill  . calcium carbonate (TUMS - DOSED IN MG ELEMENTAL CALCIUM) 500 MG chewable tablet Chew 1 tablet by mouth daily.     No current facility-administered medications for this visit.      Allergies:   Codeine and Erythromycin   Social History:  The patient  reports that she has been smoking cigarettes. She has never used smokeless tobacco. She reports that she drinks alcohol. She reports that she does not use drugs.   Family History:   family history includes Alcoholism in her other;  Arthritis in her other; Breast cancer in her paternal aunt; Cancer in her other.    Review of Systems: Review of Systems  Constitutional: Negative.   Respiratory: Negative.   Cardiovascular: Positive for palpitations.  Gastrointestinal: Negative.   Musculoskeletal: Negative.   Neurological: Negative.   Psychiatric/Behavioral: Negative.   All other systems reviewed and are negative.    PHYSICAL EXAM: VS:  BP 140/82 (BP Location: Left Arm, Patient Position: Sitting, Cuff Size: Normal)   Pulse 77   Ht 5\' 4"  (1.626 m)   Wt 185 lb 8 oz (84.1 kg)   BMI 31.84 kg/m  , BMI Body mass index is 31.84 kg/m. GEN: Well nourished, well developed, in no acute distress  HEENT: normal  Neck: no JVD, carotid bruits, or masses Cardiac: RRR; no murmurs, rubs, or gallops,no edema  Respiratory:  clear to auscultation bilaterally, normal work of breathing GI: soft, nontender, nondistended, + BS MS: no deformity or atrophy  Skin: warm and dry, no rash Neuro:  Strength and sensation are intact Psych: euthymic mood, full affect    Recent Labs: 12/12/2017: ALT 28; Hemoglobin 14.7; Platelets 199.0 03/03/2018: TSH 3.15 03/18/2018: BUN 14; Creatinine, Ser 1.11; Potassium 4.4; Sodium 139    Lipid Panel Lab Results  Component Value Date   CHOL 222 (H) 12/12/2017   HDL 40.40 12/12/2017   LDLCALC 142 (H) 12/12/2017   TRIG 198.0 (H) 12/12/2017      Wt Readings from Last 3 Encounters:  05/14/18 185 lb 8 oz (84.1 kg)  04/21/18 181 lb 6.4 oz (  82.3 kg)  04/08/18 180 lb (81.6 kg)      ASSESSMENT AND PLAN:  Paroxysmal tachycardia ([place]) -  Long discussion concerning tachycardia, seems to be paroxysmal Symptomatic Unable to exclude APCs, PVCs Possibly exacerbated by anxiety, stress at home Not every day, sometimes several times a week Recommended a long-term Holter/loop monitor for further evaluation  Palpitations - Plan: EKG 12-Lead, LONG TERM MONITOR (3-14 DAYS) As above, seems to be more in  the afternoons, evenings when trying to go to sleep Monitor ordered  Mixed hyperlipidemia Long discussion concerning elevated numbers, total cholesterol 220 And she has smoking history Risk stratification studies discussed with her, we have ordered CT coronary calcium scoring  CKD (chronic kidney disease) stage 3, GFR 30-59 ml/min (HCC) Recommend hydration  Sleep apnea, unspecified type Managed by primary care  Abnormal electrocardiogram - Plan: CT CARDIAC SCORING Several EKGs concerning for old inferior MI, She does not want stress testing or anything invasive Studies discussed with her in detail We have ordered CT coronary calcium scoring  Disposition:   F/U as needed   Total encounter time more than 60 minutes  Greater than 50% was spent in counseling and coordination of care with the patient    Orders Placed This Encounter  Procedures  . CT CARDIAC SCORING  . LONG TERM MONITOR (3-14 DAYS)  . EKG 12-Lead     Signed, Esmond Plants, M.D., Ph.D. 05/14/2018  [place], [place]

## 2018-05-14 ENCOUNTER — Ambulatory Visit (INDEPENDENT_AMBULATORY_CARE_PROVIDER_SITE_OTHER): Payer: Medicare Other

## 2018-05-14 ENCOUNTER — Encounter: Payer: Self-pay | Admitting: Cardiovascular Disease

## 2018-05-14 ENCOUNTER — Ambulatory Visit (INDEPENDENT_AMBULATORY_CARE_PROVIDER_SITE_OTHER): Payer: Medicare Other | Admitting: Cardiovascular Disease

## 2018-05-14 VITALS — BP 140/82 | HR 77 | Ht 64.0 in | Wt 185.5 lb

## 2018-05-14 DIAGNOSIS — R9431 Abnormal electrocardiogram [ECG] [EKG]: Secondary | ICD-10-CM | POA: Diagnosis not present

## 2018-05-14 DIAGNOSIS — I479 Paroxysmal tachycardia, unspecified: Secondary | ICD-10-CM

## 2018-05-14 DIAGNOSIS — E782 Mixed hyperlipidemia: Secondary | ICD-10-CM

## 2018-05-14 DIAGNOSIS — N183 Chronic kidney disease, stage 3 unspecified: Secondary | ICD-10-CM

## 2018-05-14 DIAGNOSIS — G4730 Sleep apnea, unspecified: Secondary | ICD-10-CM

## 2018-05-14 DIAGNOSIS — R002 Palpitations: Secondary | ICD-10-CM

## 2018-05-14 NOTE — Patient Instructions (Addendum)
Medication Instructions:  No changes  If you need a refill on your cardiac medications before your next appointment, please call your pharmacy.    Lab work: No new labs needed   If you have labs (blood work) drawn today and your tests are completely normal, you will receive your results only by: Marland Kitchen MyChart Message (if you have MyChart) OR . A paper copy in the mail If you have any lab test that is abnormal or we need to change your treatment, we will call you to review the results.   Testing/Procedures: We will order a CT coronary calcium score, $150 Abnormal EKG, family hx  Please call [phone] to schedule [facility] [address], [place] [address]   We will order a Zio patch/monitor For tachycardia, palpitations  Follow-Up: At [facility], you and your health needs are our priority.  As part of our continuing mission to provide you with exceptional heart care, we have created designated Provider Care Teams.  These Care Teams include your primary Cardiologist (physician) and Advanced Practice Providers (APPs -  Physician Assistants and Nurse Practitioners) who all work together to provide you with the care you need, when you need it.  . You will need a follow up appointment as needed .   Please call our office 2 months in advance to schedule this appointment.    . Providers on your designated Care Team:   . Murray Hodgkins, NP . Christell Faith, PA-C . Marrianne Mood, PA-C  Any Other Special Instructions Will Be Listed Below (If Applicable).  For educational health videos Log in to : [email or web address] Or : [email or web address], password : [email or web address]

## 2018-05-15 ENCOUNTER — Telehealth: Payer: Self-pay | Admitting: Cardiovascular Disease

## 2018-05-15 NOTE — Telephone Encounter (Signed)
Returned call to pt informed that we unable to schedule until we get prior auth from insurance takes about a week or so. She will call back then and see if ok'D

## 2018-05-15 NOTE — Telephone Encounter (Signed)
Pt is calling to schedule CT calcium test.

## 2018-05-16 ENCOUNTER — Telehealth: Payer: Self-pay | Admitting: *Deleted

## 2018-05-16 DIAGNOSIS — G4733 Obstructive sleep apnea (adult) (pediatric): Secondary | ICD-10-CM | POA: Diagnosis not present

## 2018-05-16 NOTE — Telephone Encounter (Signed)
Pt aware of results. Orders placed  Nothing further needed. 

## 2018-05-20 ENCOUNTER — Ambulatory Visit (INDEPENDENT_AMBULATORY_CARE_PROVIDER_SITE_OTHER): Payer: Medicare Other | Admitting: Psychology

## 2018-05-20 DIAGNOSIS — F332 Major depressive disorder, recurrent severe without psychotic features: Secondary | ICD-10-CM | POA: Diagnosis not present

## 2018-05-21 ENCOUNTER — Ambulatory Visit (INDEPENDENT_AMBULATORY_CARE_PROVIDER_SITE_OTHER): Payer: Medicare Other | Admitting: Psychiatry

## 2018-05-21 ENCOUNTER — Telehealth: Payer: Self-pay | Admitting: Cardiovascular Disease

## 2018-05-21 ENCOUNTER — Encounter: Payer: Self-pay | Admitting: Psychiatry

## 2018-05-21 ENCOUNTER — Other Ambulatory Visit: Payer: Self-pay

## 2018-05-21 VITALS — BP 136/79 | HR 86 | Temp 97.3°F | Wt 186.4 lb

## 2018-05-21 DIAGNOSIS — F1010 Alcohol abuse, uncomplicated: Secondary | ICD-10-CM

## 2018-05-21 DIAGNOSIS — F17200 Nicotine dependence, unspecified, uncomplicated: Secondary | ICD-10-CM | POA: Diagnosis not present

## 2018-05-21 DIAGNOSIS — F331 Major depressive disorder, recurrent, moderate: Secondary | ICD-10-CM

## 2018-05-21 DIAGNOSIS — G4700 Insomnia, unspecified: Secondary | ICD-10-CM | POA: Diagnosis not present

## 2018-05-21 NOTE — Telephone Encounter (Signed)
Last saw Dr Rockey Situ on 05/14/18. Routing to him.
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
## 2018-05-21 NOTE — Telephone Encounter (Signed)
   [facility] Pre-operative Risk Assessment    Request for surgical clearance:  1. What type of surgery is being performed? EGD/Colonoscopy  2. When is this surgery scheduled? Pending clearance  3. What type of clearance is required (medical clearance vs. Pharmacy clearance to hold med vs. Both)? Not listed  4. Are there any medications that need to be held prior to surgery and how long? Not listed  5. Practice name and name of physician performing surgery? [facility], Dr. Jonathon Bellows  6. What is your office phone number [phone]   7.   What is your office fax number [phone]  8.   Anesthesia type (None, local, MAC, general) ? Not listed   Regina Nash 05/21/2018, 1:43 PM  _________________________________________________________________   (provider comments below)

## 2018-05-22 ENCOUNTER — Encounter: Payer: Self-pay | Admitting: Psychiatry

## 2018-05-23 ENCOUNTER — Encounter: Payer: Self-pay | Admitting: Family Medicine

## 2018-05-23 ENCOUNTER — Ambulatory Visit (INDEPENDENT_AMBULATORY_CARE_PROVIDER_SITE_OTHER): Payer: Medicare Other | Admitting: Family Medicine

## 2018-05-23 VITALS — BP 114/80 | HR 85 | Temp 98.1°F | Ht 64.0 in | Wt 184.6 lb

## 2018-05-23 DIAGNOSIS — E7800 Pure hypercholesterolemia, unspecified: Secondary | ICD-10-CM

## 2018-05-23 DIAGNOSIS — N183 Chronic kidney disease, stage 3 unspecified: Secondary | ICD-10-CM

## 2018-05-23 DIAGNOSIS — R195 Other fecal abnormalities: Secondary | ICD-10-CM

## 2018-05-23 DIAGNOSIS — R9431 Abnormal electrocardiogram [ECG] [EKG]: Secondary | ICD-10-CM

## 2018-05-23 DIAGNOSIS — E782 Mixed hyperlipidemia: Secondary | ICD-10-CM

## 2018-05-23 DIAGNOSIS — F331 Major depressive disorder, recurrent, moderate: Secondary | ICD-10-CM

## 2018-05-23 DIAGNOSIS — M545 Low back pain: Secondary | ICD-10-CM

## 2018-05-23 DIAGNOSIS — S32010D Wedge compression fracture of first lumbar vertebra, subsequent encounter for fracture with routine healing: Secondary | ICD-10-CM | POA: Diagnosis not present

## 2018-05-23 DIAGNOSIS — G8929 Other chronic pain: Secondary | ICD-10-CM

## 2018-05-23 DIAGNOSIS — I479 Paroxysmal tachycardia, unspecified: Secondary | ICD-10-CM

## 2018-05-23 NOTE — Assessment & Plan Note (Signed)
She is awaiting cardiac evaluation prior to undergoing colonoscopy.

## 2018-05-23 NOTE — Patient Instructions (Signed)
Nice to see you. We will have you keep your next lab appointment. We will get you referred to Dr. Tamala Julian. If you develop numbness between your legs, loss of bowel or bladder function, or weakness in your legs or worsening back pain please seek medical attention immediately. If you develop persistent chest pain please go to the emergency room.

## 2018-05-23 NOTE — Progress Notes (Signed)
Tommi Rumps, MD Phone: [phone]  Regina Nash is a 67 y.o. female who presents today for f/u.  CC: ckd, abnormal EKG, chronic back pain  CKD stage III: Patient has undergone work-up for this with negative urine protein testing and negative ultrasound for cause.  She avoids NSAIDs.  Abnormal EKG: Patient recently saw cardiology.  Her EKG revealed findings of an old inferior infarct.  She wondered why she had not been informed of this previously.  She notes only one episode of significant chest pain in the 1990s where she had a hard time breathing.  [place]  She does note occasional discomfort that goes away with antacids.  They are planning to do a coronary calcium score as the patient does not want any invasive work-up.  She is also wearing a monitor to evaluate for her palpitations.  Chronic back pain: She reports she has been released by orthopedics and was released to go back to work on 05/16/2018.  She does not feel she can go back to work which is quite physical.  She does note her back feels better though anytime she tries to lift or push anything of any significant weight her back pain gets worse.  She reports they wanted to do injections though the patient does not want to do those.  She cannot take NSAIDs based on GIs advice.  She was advised by orthopedics to stop PT after restarting PT through orthopedics.  She does note intermittent numbness in her left leg at times.  She notes an electrical current sensation in her left groin though no numbness or tingling in the saddle area.  This lasts fairly briefly.  She can have some days where it does not occur and other days and it can occur 2-3 times.  No pain in the saddle area.  No incontinence.  She notes the belt she got from orthopedics was not beneficial.  Patient does note she continues with therapy.  This seems to be helpful.  She did see psychiatry and they wanted to start her on antidepressants though the  patient does not want that.  Social History   Tobacco Use  Smoking Status Current Every Day Smoker  . Packs/day: 0.50  . Types: Cigarettes  Smokeless Tobacco Never Used     ROS see history of present illness  Objective  Physical Exam Vitals:   05/23/18 1321  BP: 114/80  Pulse: 85  Temp: 98.1 F (36.7 C)  SpO2: 95%    BP Readings from Last 3 Encounters:  05/23/18 114/80  05/14/18 140/82  04/21/18 118/70   Wt Readings from Last 3 Encounters:  05/23/18 184 lb 9.6 oz (83.7 kg)  05/14/18 185 lb 8 oz (84.1 kg)  04/21/18 181 lb 6.4 oz (82.3 kg)    Physical Exam  Constitutional: No distress.  Cardiovascular: Normal rate, regular rhythm and normal heart sounds.  Pulmonary/Chest: Effort normal and breath sounds normal.  Musculoskeletal: She exhibits no edema.  No midline spine tenderness, no midline spine step-off, no muscular back tenderness  Neurological: She is alert.  5/5 strength bilateral quads, hamstrings, plantar flexion, and dorsiflexion, sensation light touch intact bilateral lower extremities, 2+ patellar reflexes  Skin: Skin is warm and dry. She is not diaphoretic.     Assessment/Plan: Please see individual problem list.  Paroxysmal tachycardia ([place]) She will continue to follow with cardiology and complete the event monitor.  Compression fracture of first lumbar vertebra (HCC) I have referred the patient to sports  medicine for a second opinion of her back to see if there is anything additional that could be helpful.  CKD (chronic kidney disease) stage 3, GFR 30-59 ml/min (HCC) She will continue to avoid NSAIDs.  I discussed that kidney function typically does decrease some as people age.  I discussed that she may stay at the same level for a long period of time though her kidney function could decline.  We will recheck in December as planned.  Hyperlipidemia Recheck LDL with her next set of labs.  She is hesitant to try medication.  Encourage dietary  changes.  Depression She will continue to see the therapist and psychiatrist.  Positive colorectal cancer screening using Cologuard test She is awaiting cardiac evaluation prior to undergoing colonoscopy.  Abnormal electrocardiogram I apologized to the patient that I did not inform her of the prior findings on her EKG.  She will undergo evaluation with coronary calcium scoring through cardiology.   Patient will cancel her appointment in December and keep her welcome to Medicare visit in early January.  Orders Placed This Encounter  Procedures  . LDL cholesterol, direct    Standing Status:   Future    Standing Expiration Date:   05/24/2019  . Ambulatory referral to Sports Medicine    Referral Priority:   Routine    Referral Type:   Consultation    Number of Visits Requested:   1    No orders of the defined types were placed in this encounter.    Tommi Rumps, MD [place]

## 2018-05-23 NOTE — Assessment & Plan Note (Signed)
She will continue to avoid NSAIDs.  I discussed that kidney function typically does decrease some as people age.  I discussed that she may stay at the same level for a long period of time though her kidney function could decline.  We will recheck in December as planned.

## 2018-05-23 NOTE — Assessment & Plan Note (Signed)
Recheck LDL with her next set of labs.  She is hesitant to try medication.  Encourage dietary changes.

## 2018-05-23 NOTE — Assessment & Plan Note (Signed)
I apologized to the patient that I did not inform her of the prior findings on her EKG.  She will undergo evaluation with coronary calcium scoring through cardiology.

## 2018-05-23 NOTE — Assessment & Plan Note (Signed)
She will continue to follow with cardiology and complete the event monitor.

## 2018-05-23 NOTE — Assessment & Plan Note (Signed)
I have referred the patient to sports medicine for a second opinion of her back to see if there is anything additional that could be helpful.

## 2018-05-23 NOTE — Assessment & Plan Note (Signed)
She will continue to see the therapist and psychiatrist.

## 2018-05-25 NOTE — Telephone Encounter (Signed)
Acceptable risk for procedure No further testing needed 

## 2018-05-26 ENCOUNTER — Telehealth: Payer: Self-pay | Admitting: Family Medicine

## 2018-05-26 NOTE — Telephone Encounter (Signed)
Pt dropped off medical release forms that Dr. Caryl Bis needs to complete. Forms are up front in color folder.

## 2018-05-27 NOTE — Telephone Encounter (Signed)
Placed for Dr. Caryl Bis to fill out and sign. Thanks

## 2018-05-28 DIAGNOSIS — R002 Palpitations: Secondary | ICD-10-CM | POA: Diagnosis not present

## 2018-05-28 DIAGNOSIS — I479 Paroxysmal tachycardia, unspecified: Secondary | ICD-10-CM | POA: Diagnosis not present

## 2018-05-29 ENCOUNTER — Ambulatory Visit (INDEPENDENT_AMBULATORY_CARE_PROVIDER_SITE_OTHER): Payer: Medicare Other | Admitting: Psychology

## 2018-05-29 DIAGNOSIS — F332 Major depressive disorder, recurrent severe without psychotic features: Secondary | ICD-10-CM

## 2018-05-29 NOTE — Telephone Encounter (Signed)
Spoke with pt to inform that we have received her cardiac clearance from Dr. Rockey Situ. I informed pt that she is cleared to proceed with the Endoscopy procedure, however pt states she wants to hold off on scheduling the procedure until after she's completed current and planned cardiac tests. Pt states she'll contact our office to schedule once she receives results from cardiology.

## 2018-05-29 NOTE — Telephone Encounter (Signed)
Form completed.  Please add my license number and then it can be faxed.

## 2018-05-30 NOTE — Telephone Encounter (Signed)
Paperwork faxed to [facility]  patient advised

## 2018-06-03 DIAGNOSIS — R002 Palpitations: Secondary | ICD-10-CM | POA: Diagnosis not present

## 2018-06-05 ENCOUNTER — Ambulatory Visit: Payer: PRIVATE HEALTH INSURANCE | Admitting: Psychology

## 2018-06-10 ENCOUNTER — Ambulatory Visit: Payer: Medicare Other | Admitting: Family Medicine
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
## 2018-06-15 ENCOUNTER — Encounter: Payer: Self-pay | Admitting: Family Medicine

## 2018-06-16 NOTE — Telephone Encounter (Signed)
Patient aware of recent heart monitor results. She is aware Dr Rockey Situ cleared her for her endoscopy/colonoscopy but it was before the monitor results. She wants to make sure it is still ok with Dr Rockey Situ taking into consideration these monitor results. Routing to Dr Rockey Situ.

## 2018-06-16 NOTE — Telephone Encounter (Signed)
Yes  monitor look relatively benign ,no significant arrhythmia Would be fine to proceed

## 2018-06-16 NOTE — Telephone Encounter (Signed)
Patient notified and verbalized understanding of Dr Donivan Scull recommendations.

## 2018-06-18 ENCOUNTER — Ambulatory Visit (INDEPENDENT_AMBULATORY_CARE_PROVIDER_SITE_OTHER)
Admission: RE | Admit: 2018-06-18 | Discharge: 2018-06-18 | Disposition: A | Discharge status: Home / Self Care | Payer: Self-pay | Source: Ambulatory Visit | Attending: Cardiovascular Disease | Admitting: Cardiovascular Disease

## 2018-06-18 DIAGNOSIS — R9431 Abnormal electrocardiogram [ECG] [EKG]: Secondary | ICD-10-CM

## 2018-06-19 ENCOUNTER — Other Ambulatory Visit (INDEPENDENT_AMBULATORY_CARE_PROVIDER_SITE_OTHER): Payer: Medicare Other

## 2018-06-19 DIAGNOSIS — E7800 Pure hypercholesterolemia, unspecified: Secondary | ICD-10-CM

## 2018-06-19 DIAGNOSIS — N179 Acute kidney failure, unspecified: Secondary | ICD-10-CM | POA: Diagnosis not present

## 2018-06-19 LAB — BASIC METABOLIC PANEL
BUN: 14 mg/dL (ref 6–23)
CALCIUM: 9.6 mg/dL (ref 8.4–10.5)
CO2: 28 mEq/L (ref 19–32)
Chloride: 105 mEq/L (ref 96–112)
Creatinine, Ser: 1.04 mg/dL (ref 0.40–1.20)
GFR: 56.46 mL/min — ABNORMAL LOW (ref >60.00)
GLUCOSE: 115 mg/dL — AB (ref 70–99)
Potassium: 4.4 mEq/L (ref 3.5–5.1)
SODIUM: 140 meq/L (ref 135–145)

## 2018-06-19 LAB — LDL CHOLESTEROL, DIRECT: Direct LDL: 123 mg/dL

## 2018-06-27 ENCOUNTER — Other Ambulatory Visit: Payer: Self-pay | Admitting: Family Medicine

## 2018-06-27 DIAGNOSIS — Z780 Asymptomatic menopausal state: Secondary | ICD-10-CM

## 2018-07-01 ENCOUNTER — Ambulatory Visit (INDEPENDENT_AMBULATORY_CARE_PROVIDER_SITE_OTHER): Payer: Medicare Other | Admitting: Psychology

## 2018-07-01 DIAGNOSIS — F332 Major depressive disorder, recurrent severe without psychotic features: Secondary | ICD-10-CM | POA: Diagnosis not present

## 2018-07-02 ENCOUNTER — Telehealth: Payer: Self-pay

## 2018-07-02 NOTE — Telephone Encounter (Signed)
-----   Message from Minna Merritts, MD sent at 06/28/2018  3:08 PM EST ----- CT coronary calcium score Mildly elevated score of 83 Although score is low, this was actually 79th percentile based on her age which is high Would she consider a cholesterol medication such as low-dose Crestor Ideally total cholesterol should be 150, our most recent measurement 220

## 2018-07-02 NOTE — Telephone Encounter (Signed)
1-   Called pt to discuss result note from [place] (06/28/18), pt reports she read results on my chart. However, she refuses to take "statins". She is interested in taking something herbal or nonmedicinal to lower LDL.   She reports that she can diet but is unable to exercise d/t back injury.    Labs were repeated by PCP on 06/19/18, per note from provider, "Notes recorded by Leone Haven, MD on 06/21/2018 at 10:15 AM EST Please let the patient know that her cholesterol has improved slightly though is still not quite at goal. I would suggest starting on a cholesterol medication to help reduce her risk of stroke and heart attack. If she is willing to do this I can send it to her pharmacy. She would need repeat labs in 1 month."  Per telephone note pt strongly refused medication.    2-  Pt also had question r/t Ca scoring results that she read in mychart, "Coronary arteries: Difficult to tell if calcium seen in right AV groove is located in mid RCA or RA. Suggest echo to assess RA If calcium in RCA score calculated below..."  She wants to know if she needs an Echo scheduled as indicated by result note.

## 2018-07-04 ENCOUNTER — Ambulatory Visit: Payer: Self-pay | Admitting: Family Medicine

## 2018-07-07 ENCOUNTER — Telehealth: Payer: Self-pay | Admitting: *Deleted

## 2018-07-07 NOTE — Telephone Encounter (Signed)
Copied from [place] [id number]. Topic: General - Other >> Jul 07, 2018 12:38 PM Valla Leaver wrote: Reason for CRM: [employer] faxed on 11/23 to get office notes for Long Term Disability at [place]. They faxed again today, per Benjamine Mola at the [facility]. Please call her back to confirm it was received.

## 2018-07-11 NOTE — Telephone Encounter (Signed)
Reviewed provider recommendations and results with her in detail and she would like to try herbal and or natural remedies for cholesterol at this time and absolutely refuses any statins. Reviewed that she should have labs rechecked after she starts a regimen of her choice and that way we can see if there is any improvement. She verbalized understanding, was appreciative for the call back, and had no further questions at this time.

## 2018-07-11 NOTE — Telephone Encounter (Signed)
I have reviewed the CT scan There appears to be a mild amount of calcification on the mitral valve annulus It is certainly her choice but I do not feel strongly in an echocardiogram This is a process that can come on over many years, decades Should not be affecting the way the valve performs Again on the CT there is mild coronary calcification Her choice whether to proceed with statin or not, could try over-the-counter
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
## 2018-07-14 ENCOUNTER — Ambulatory Visit (INDEPENDENT_AMBULATORY_CARE_PROVIDER_SITE_OTHER): Payer: Medicare Other | Admitting: Family Medicine

## 2018-07-14 ENCOUNTER — Encounter: Payer: Self-pay | Admitting: Family Medicine

## 2018-07-14 DIAGNOSIS — M47816 Spondylosis without myelopathy or radiculopathy, lumbar region: Secondary | ICD-10-CM | POA: Diagnosis not present

## 2018-07-14 NOTE — Patient Instructions (Signed)
Good to see you  Ice is your friend Exercises 3 times a week.  pennsaid pinkie amount topically 2 times daily as needed.  Exercises 3 times a week.  Vitamin D 2000 IU daily  Turmeric 500mg  daily  Tart cherry extract 1500mg  daily  See me again in 4-5 weeks Happy New Year!

## 2018-07-14 NOTE — Assessment & Plan Note (Signed)
Patient is having facet arthritis.  I do believe that this can give her some nerve impingement more in the left sided involvement.  Patient though has elected to try conservative therapy.  Given different home exercise, discussed icing regimen, patient declined gabapentin or any type of injections at the moment.  Discussed with patient that these could be very beneficial.  I do not believe that patient's previous compression fracture is likely contributing to the pain that she is having at this moment.  We will see how patient responds to conservative therapy at this point.  Follow-up again 4 to 6 weeks

## 2018-07-15 NOTE — Telephone Encounter (Signed)
Called Regina Nash and left a VM to call back.

## 2018-07-17 ENCOUNTER — Ambulatory Visit (INDEPENDENT_AMBULATORY_CARE_PROVIDER_SITE_OTHER): Payer: Medicare Other | Admitting: Psychology

## 2018-07-17 DIAGNOSIS — F332 Major depressive disorder, recurrent severe without psychotic features: Secondary | ICD-10-CM | POA: Diagnosis not present

## 2018-07-21 ENCOUNTER — Ambulatory Visit: Payer: Self-pay | Admitting: Family Medicine

## 2018-07-23 ENCOUNTER — Ambulatory Visit: Payer: Medicare Other | Admitting: Psychiatry

## 2018-07-23 NOTE — Telephone Encounter (Signed)
Called and spoke with Benjamine Mola and she stated that she did review a fax from [place] will review and if she needs any thing else she will reach out to [place].

## 2018-07-31 ENCOUNTER — Other Ambulatory Visit: Payer: Self-pay | Admitting: *Deleted

## 2018-07-31 DIAGNOSIS — G4719 Other hypersomnia: Secondary | ICD-10-CM

## 2018-08-04 ENCOUNTER — Encounter: Payer: Self-pay | Admitting: Family Medicine

## 2018-08-05 ENCOUNTER — Ambulatory Visit (INDEPENDENT_AMBULATORY_CARE_PROVIDER_SITE_OTHER): Payer: Medicare Other | Admitting: Psychology

## 2018-08-05 DIAGNOSIS — F332 Major depressive disorder, recurrent severe without psychotic features: Secondary | ICD-10-CM | POA: Diagnosis not present

## 2018-08-18 ENCOUNTER — Ambulatory Visit: Payer: Self-pay | Admitting: Family Medicine

## 2018-08-26 ENCOUNTER — Ambulatory Visit: Payer: Medicare Other | Admitting: Psychology

## 2018-09-15 ENCOUNTER — Ambulatory Visit
Admission: RE | Admit: 2018-09-15 | Discharge: 2018-09-15 | Disposition: A | Discharge status: Home / Self Care | Payer: Medicare Other | Source: Ambulatory Visit | Attending: Family Medicine | Admitting: Family Medicine

## 2018-09-15 DIAGNOSIS — Z13820 Encounter for screening for osteoporosis: Secondary | ICD-10-CM | POA: Diagnosis not present

## 2018-09-15 DIAGNOSIS — Z780 Asymptomatic menopausal state: Secondary | ICD-10-CM | POA: Diagnosis not present

## 2018-09-16 ENCOUNTER — Ambulatory Visit: Payer: Self-pay | Admitting: Family Medicine

## 2018-09-19 ENCOUNTER — Telehealth: Payer: Self-pay | Admitting: Internal Medicine

## 2018-09-19 DIAGNOSIS — G4733 Obstructive sleep apnea (adult) (pediatric): Secondary | ICD-10-CM

## 2018-09-19 NOTE — Telephone Encounter (Signed)
Called and spoke to pt. Pt states she was scheduled for cpap class today with CPAP. Pt was never made aware that [facility] had closed. Pt is requesting to switch DME companies to someone local.  Order has been replaced for apadt.  Nothing further is needed.

## 2018-09-22 ENCOUNTER — Ambulatory Visit (INDEPENDENT_AMBULATORY_CARE_PROVIDER_SITE_OTHER): Payer: Medicare Other

## 2018-09-22 ENCOUNTER — Encounter: Payer: Self-pay | Admitting: Family Medicine

## 2018-09-22 ENCOUNTER — Ambulatory Visit (INDEPENDENT_AMBULATORY_CARE_PROVIDER_SITE_OTHER): Payer: Medicare Other | Admitting: Family Medicine

## 2018-09-22 VITALS — BP 118/80 | HR 80 | Temp 97.8°F | Ht 64.0 in | Wt 187.0 lb

## 2018-09-22 DIAGNOSIS — G4730 Sleep apnea, unspecified: Secondary | ICD-10-CM | POA: Diagnosis not present

## 2018-09-22 DIAGNOSIS — M545 Low back pain: Secondary | ICD-10-CM | POA: Diagnosis not present

## 2018-09-22 DIAGNOSIS — M1712 Unilateral primary osteoarthritis, left knee: Secondary | ICD-10-CM | POA: Diagnosis not present

## 2018-09-22 DIAGNOSIS — M25561 Pain in right knee: Secondary | ICD-10-CM

## 2018-09-22 DIAGNOSIS — M25562 Pain in left knee: Secondary | ICD-10-CM | POA: Diagnosis not present

## 2018-09-22 DIAGNOSIS — G8929 Other chronic pain: Secondary | ICD-10-CM | POA: Diagnosis not present

## 2018-09-22 DIAGNOSIS — M1711 Unilateral primary osteoarthritis, right knee: Secondary | ICD-10-CM | POA: Diagnosis not present

## 2018-09-22 NOTE — Patient Instructions (Signed)
Nice to see you. We will get x-rays today.  We will refer you to a disability specialist to determine long term disability.

## 2018-09-24 ENCOUNTER — Telehealth: Payer: Self-pay | Admitting: Family Medicine

## 2018-09-24 DIAGNOSIS — R195 Other fecal abnormalities: Secondary | ICD-10-CM

## 2018-09-24 DIAGNOSIS — M25561 Pain in right knee: Secondary | ICD-10-CM | POA: Insufficient documentation

## 2018-09-24 DIAGNOSIS — M25562 Pain in left knee: Secondary | ICD-10-CM

## 2018-09-24 NOTE — Progress Notes (Addendum)
Regina Rumps, MD Phone: [phone]  Regina Nash is a 68 y.o. female who presents today for f/u.  CC: knee pain, chronic back pain, OSA  Knee pain: This is bilateral.  The patient notes several weeks ago she was just standing there and felt her left knee pop.  She had pain and discomfort superior to the patella with this.  She also notes in the last week or so she was walking and felt her right leg buckle.  She had not turned she was just walking at a slow pace into her bedroom.  She has continued to have pain with these issues.  Chronic back pain: Patient notes her back is not worse.  Notes she has improved to some degree and is able to do more than she was previously.  She notes she can sit up for 2 hours at a time if she is able to get up and up to 8 hours if she is able to take breaks.  She can stand for 15 to 20 minutes at a time.  She can walk for an hour at a time.  She can lift 15 to 20 pounds.  She is not able to run.  She does not feel steady on a ladder.  Patient notes if she bends at the waist for more than 2 to 3 minutes this hurts.  No radiation of the pain.  She does not kneel or crouch.  She saw sports medicine who felt her back pain was not likely related to the compression fracture though more likely related to facet arthritis with the possibility of this giving her some nerve impingement on the left side.  The patient opted for conservative therapy and she was given a home exercise regimen discussed icing.  He discussed gabapentin and injections though she declined those.  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  Patient did undergo a bone density scan.  This revealed normal findings.  She gets 2 glasses of milk and 1 cup of yogurt daily.  She takes vitamin D combined with calcium.  Sleep apnea: Patient has had difficulty  getting her CPAP set up.  The homecare company that she was going to use closed.  She has been in touch with pulmonology late last week regarding this.  Social History   Tobacco Use  Smoking Status Current Every Day Smoker  . Packs/day: 0.50  . Types: Cigarettes  Smokeless Tobacco Never Used     ROS see history of present illness  Objective  Physical Exam Vitals:   09/22/18 1117  BP: 118/80  Pulse: 80  Temp: 97.8 F (36.6 C)  SpO2: 97%    BP Readings from Last 3 Encounters:  09/22/18 118/80  07/14/18 124/78  05/23/18 114/80   Wt Readings from Last 3 Encounters:  09/22/18 187 lb (84.8 kg)  07/14/18 183 lb (83 kg)  05/23/18 184 lb 9.6 oz (83.7 kg)    Physical Exam Constitutional:      General: She is not in acute distress.    Appearance: She is not diaphoretic.  Cardiovascular:     Rate and Rhythm: Normal rate and regular rhythm.     Heart sounds: Normal heart sounds.  Pulmonary:     Effort: Pulmonary effort is normal.     Breath sounds: Normal breath sounds.  Musculoskeletal:     Comments: Patient is sitting comfortably in the chair.  She moves relatively comfortably to the table with only mild appearing discomfort, she has no midline spine tenderness, no midline spine step-off, no muscular back tenderness, bilateral knees with no tenderness, no ligamentous laxity, negative McMurray's, no warmth or erythema  Skin:    General: Skin is warm and dry.  Neurological:     Mental Status: She is alert.     Comments: 5/5 strength bilateral quads, hamstrings, plantar flexion, and dorsiflexion, sensation to light touch intact bilateral lower extremities      Assessment/Plan: Please see individual problem list.  Sleep apnea I encouraged the patient to reach back out to her pulmonologist if she has not heard anything regarding her new DME company.  It appears they did place another order.  Chronic low back pain without sciatica Suspected to be related to facet  arthritis by sports medicine.  This would certainly be the most likely diagnosis at this time.  At this time it would seem to be less likely to be related to her compression fracture given duration.  She has disability paperwork that needs to be filled out and I advised her that I would complete this.  I discussed with her that she would likely be able to go back to work in some capacity though she is unsure that she would be able to return to her physically demanding job at [employer].  We discussed referral for an exam to determine if her current level of impairment would keep her from doing a physically demanding job and to see if this would help determine her capabilities moving forward given her improvement.  This referral was placed.  Pain in both knees Recent onset.  No specific injuries.  Benign exam.  Will obtain x-rays.  Discussed possible referral to orthopedics for this moving forward.   Health Maintenance: Noted that the patient had not completed her colonoscopy for a positive Cologuard after the patient left the office.  I will have my CMA contact the patient regarding this.  Telephone encounter created.  Orders Placed This Encounter  Procedures  . DG Knee 3 Views Left    Standing Status:   Future    Number of Occurrences:   1    Standing Expiration Date:   11/22/2019    Order Specific Question:   Reason for Exam (SYMPTOM  OR DIAGNOSIS REQUIRED)    Answer:   left knee pain, popping of knee, 2-3 weeks duration, no injury    Order Specific Question:   Preferred imaging location?    Answer:   [facility] Specific Question:   Radiology Contrast Protocol - do NOT remove file path    Answer:   \\[email or web address]  . DG Knee 3 Views Right    Standing Status:   Future    Number of Occurrences:   1    Standing Expiration Date:   11/22/2019    Order Specific Question:   Reason for Exam (SYMPTOM  OR DIAGNOSIS REQUIRED)    Answer:    right knee pain, buckled while walking with no specific injury    Order Specific Question:   Preferred imaging location?    Answer:   [facility]  . Ambulatory referral to Internal Medicine    Referral Priority:   Routine    Referral Type:   Consultation    Referral Reason:   Specialty Services Required    Requested  Specialty:   Internal Medicine    Number of Visits Requested:   1    No orders of the defined types were placed in this encounter.    Regina Rumps, MD [place]

## 2018-09-24 NOTE — Assessment & Plan Note (Signed)
Recent onset.  No specific injuries.  Benign exam.  Will obtain x-rays.  Discussed possible referral to orthopedics for this moving forward.

## 2018-09-24 NOTE — Assessment & Plan Note (Addendum)
Suspected to be related to facet arthritis by sports medicine.  This would certainly be the most likely diagnosis at this time.  At this time it would seem to be less likely to be related to her compression fracture given duration.  She has disability paperwork that needs to be filled out and I advised her that I would complete this.  I discussed with her that she would likely be able to go back to work in some capacity though she is unsure that she would be able to return to her physically demanding job at [employer].  We discussed referral for an exam to determine if her current level of impairment would keep her from doing a physically demanding job and to see if this would help determine her capabilities moving forward given her improvement.  This referral was placed.

## 2018-09-24 NOTE — Assessment & Plan Note (Signed)
I encouraged the patient to reach back out to her pulmonologist if she has not heard anything regarding her new DME company.  It appears they did place another order.

## 2018-09-24 NOTE — Telephone Encounter (Signed)
Please contact the patient.  I noted after she left that she had not had her colonoscopy yet.  Please see when she plans to have this done.  We can always help facilitate this being completed.

## 2018-09-25 ENCOUNTER — Telehealth: Payer: Self-pay | Admitting: Family Medicine

## 2018-09-25 DIAGNOSIS — M25562 Pain in left knee: Secondary | ICD-10-CM

## 2018-09-25 NOTE — Telephone Encounter (Signed)
Attempted to contact the patient to discuss my prior message and discuss her disability paperwork. No answer. I left a message asking her to call back to the office.

## 2018-09-25 NOTE — Telephone Encounter (Signed)
Entered in delay.  Spoke with patient earlier today.  Advised of x-ray results of her knees.  Discussed option of proceeding with MRI versus seeing orthopedics.  She opted to proceed with MRI just of her left knee at this time.  No pacemaker or metal in her body.  No history of metal working.  Will order left knee MRI.  I also discussed with the patient that I had completed her disability paperwork and noted that I felt she could return to sedentary work with hour restrictions and activity restrictions.  She verbalized understanding.

## 2018-09-26 NOTE — Telephone Encounter (Signed)
Pt stated that Dr. Caryl Bis had already called her to talk about this the other day and he asked if it was OK to refer her to Dr. Georgeann Oppenheim office and the pt stated OK.   Sent to PCP did you already speak with pt regarding this issue?

## 2018-09-27 NOTE — Addendum Note (Signed)
Addended by: Leone Haven on: 09/27/2018 09:23 AM   Modules accepted: Orders

## 2018-09-27 NOTE — Telephone Encounter (Signed)
Referral placed. I spoke with her on 09/25/18.

## 2018-10-02 ENCOUNTER — Telehealth: Payer: Self-pay

## 2018-10-02 NOTE — Telephone Encounter (Signed)
Copied from [place] [id number]. Topic: General - Other >> Oct 02, 2018  4:36 PM Wynetta Emery, [place] C wrote: Reason for CRM: pt called in to update provider. Pt says that she was referred to GI, pt says that she was told by GI that they are not scheduling np at this time due to the [facility] Virus  .

## 2018-10-02 NOTE — Telephone Encounter (Signed)
Sent to PCP just as an [place]

## 2018-10-03 ENCOUNTER — Encounter: Payer: Self-pay | Admitting: Family Medicine

## 2018-10-03 NOTE — Telephone Encounter (Signed)
Noted. She should plan on seeing them once they are seeing patients again.

## 2018-10-03 NOTE — Telephone Encounter (Signed)
Called and spoke with patient. Pt advised and voiced understanding.  

## 2018-10-06 NOTE — Telephone Encounter (Signed)
Sent to PCP as an FYI  

## 2018-10-07 ENCOUNTER — Ambulatory Visit: Payer: Medicare Other

## 2018-10-15 ENCOUNTER — Ambulatory Visit: Payer: Medicare Other | Admitting: Family Medicine

## 2018-10-17 ENCOUNTER — Telehealth: Payer: Self-pay | Admitting: Family Medicine

## 2018-10-17 NOTE — Telephone Encounter (Signed)
Routed to wrong office 

## 2018-10-17 NOTE — Telephone Encounter (Signed)
Copied from [place] [id number]. Topic: Quick Communication - See Telephone Encounter >> Oct 17, 2018  1:42 PM Robina Ade, Helene Kelp D wrote: CRM for notification. See Telephone encounter for: 10/17/18. Elizabeth with [facility] disability department called and would like office notes for patient for her long term disability claim. Her call back number is [phone] and fax is [phone].

## 2018-10-20 NOTE — Telephone Encounter (Signed)
Please call them back and find out if there are specific notes they need. Are there specific dates they need them from? Once you know this please verbally check with Lorriane Shire to see if we need a formal faxed request. Thanks.

## 2018-10-20 NOTE — Telephone Encounter (Signed)
Sent to PCP ?

## 2018-10-20 NOTE — Telephone Encounter (Signed)
Called and spoke with Regina Nash and the paper work needed to go to [place] Hulan Saas this has been done OK to close this out per [employer]

## 2018-10-22 ENCOUNTER — Telehealth: Payer: Self-pay | Admitting: Cardiovascular Disease

## 2018-10-22 NOTE — Telephone Encounter (Signed)
Received records request Disabiltiy Determination Services forwarded to [facility] for processing.

## 2018-10-23 ENCOUNTER — Telehealth: Payer: Self-pay

## 2018-10-23 NOTE — Telephone Encounter (Signed)
Received disability paperwork from [place]. Faxed to [employer].

## 2018-10-24 ENCOUNTER — Telehealth: Payer: Self-pay | Admitting: Cardiovascular Disease

## 2018-10-24 NOTE — Telephone Encounter (Signed)
Received records request Disability Determination Services, forwarded to [employer] for processing.  

## 2018-10-27 ENCOUNTER — Encounter: Payer: Self-pay | Admitting: *Deleted

## 2018-11-04 NOTE — Telephone Encounter (Signed)
Received additional DSS paperwork. Faxed to [employer] again.

## 2018-12-01 ENCOUNTER — Telehealth: Payer: Self-pay

## 2018-12-01 NOTE — Telephone Encounter (Signed)
LM on VM for patient that [employer] has been trying to get in touch with her for setup. Left number for setup with Butch Penny [phone] if she wishes to proceed.

## 2019-01-29 ENCOUNTER — Telehealth: Payer: Self-pay | Admitting: Family Medicine

## 2019-01-29 NOTE — Telephone Encounter (Signed)
Please contact the patient and get her set up for an office visit. I received a release to return to work form from [employer] and she needs to be seen in person to complete this. Please get her scheduled for an in person visit. Thanks.

## 2019-02-02 NOTE — Telephone Encounter (Signed)
Called to schedule appt.  No answer.  LMTCB.

## 2019-02-11 NOTE — Telephone Encounter (Signed)
Please try to contact the patient again. Thanks.

## 2019-02-12 NOTE — Telephone Encounter (Signed)
Called pt to schedule appt.  No answer.  LMTCB.

## 2019-02-12 NOTE — Telephone Encounter (Signed)
Called pt again.  No answer. LMTCB.  Also sent a MyChart message w/ Dr. Ellen Henri message and requested pt to call office to scheduled an in-person appt.

## 2019-02-12 NOTE — Telephone Encounter (Signed)
I spoke with the patient she will call [employer] regarding her disability. The patient will call this office back to follow up.

## 2019-03-05 ENCOUNTER — Ambulatory Visit: Payer: Self-pay

## 2019-03-05 NOTE — Telephone Encounter (Signed)
Returned call to patient who report vaginal bleeding since Sunday.  She states that it appears to be old blood, brown in color. She states that she has a constant cramping feeling.  She may use 1 or 2 panty liners per day. Her last menstrual period was 9 years ago. Her last PAP was normal about 1 year ago.she denies other symptoms. Care advice read to patient. She verbalized understanding. Call transferred to office for scheduling.  Reason for Disposition . Postmenopausal vaginal bleeding  Answer Assessment - Initial Assessment Questions 1. AMOUNT: "Describe the bleeding that you are having." "How much bleeding is there?"    - SPOTTING: spotting, or pinkish / brownish mucous discharge; does not fill panti-liner or pad    - MILD:  less than 1 pad / hour; less than patient's  menstrual bleeding when she still had menstrual periods   - MODERATE: 1-2 pads / hour; small-medium blood clots (e.g., pea, grape, small coin)    - SEVERE: soaking 2 or more pads/hour for 2 or more hours; bleeding not contained by pads or continuous red blood from vagina; large blood clots (e.g., golf ball, large coin)     mild 2. ONSET: "When did the bleeding begin?" "Is it continuing now?"    Last week on Sunday 3. MENOPAUSE: "When was your last menstrual period?"      9 years ago 4. ABDOMINAL PAIN: "Do you have any pain?" "How bad is the pain?"  (e.g., Scale 1-10; mild, moderate, or severe)   - MILD (1-3): doesn't interfere with normal activities, abdomen soft and not tender to touch    - MODERATE (4-7): interferes with normal activities or awakens from sleep, tender to touch    - SEVERE (8-10): excruciating pain, doubled over, unable to do any normal activities     Mild cramping constant 5. BLOOD THINNERS: "Do you take any blood thinners?" (e.g., Coumadin/warfarin, Pradaxa/dabigatran, aspirin)     no 6. HORMONES: "Are you taking any hormone medications, prescription or OTC?" (e.g., birth control pills, estrogen)     no 7. CAUSE: "What do you think is causing the bleeding?" (e.g., recent gyn surgery, recent gyn procedure; known bleeding disorder, uterine cancer)       no 8. HEMODYNAMIC STATUS: "Are you weak or feeling lightheaded?" If so, ask: "Can you stand and walk normally?"       no 9. OTHER SYMPTOMS: "What other symptoms are you having with the bleeding?" (e.g., back pain, burning with urination, fever)    Back pain but has back injury  Protocols used: VAGINAL BLEEDING - POSTMENOPAUSAL-A-AH

## 2019-03-05 NOTE — Telephone Encounter (Signed)
Patient was scheduled an appt for

## 2019-03-05 NOTE — Telephone Encounter (Signed)
FYI Patient scheduled an in-person office visit w/ Philis Nettle, FNP tomorrow (03/06/19) @ 3:00 pm.  Patient has no COVID symptoms.

## 2019-03-05 NOTE — Telephone Encounter (Signed)
Patient scheduled an in-person office visit w/ Philis Nettle, FNP tomorrow (03/06/19) @ 3:00 pm.  Patient has no COVID symptoms.

## 2019-03-06 ENCOUNTER — Other Ambulatory Visit (HOSPITAL_COMMUNITY)
Admission: RE | Admit: 2019-03-06 | Discharge: 2019-03-06 | Disposition: A | Discharge status: Home / Self Care | Payer: Medicare Other | Source: Ambulatory Visit | Attending: Family Medicine | Admitting: Family Medicine

## 2019-03-06 ENCOUNTER — Other Ambulatory Visit: Payer: Self-pay

## 2019-03-06 ENCOUNTER — Ambulatory Visit (INDEPENDENT_AMBULATORY_CARE_PROVIDER_SITE_OTHER): Payer: Medicare Other | Admitting: Family Medicine

## 2019-03-06 VITALS — BP 130/88 | HR 92 | Temp 96.5°F | Resp 16 | Ht 64.0 in | Wt 184.4 lb

## 2019-03-06 DIAGNOSIS — N950 Postmenopausal bleeding: Secondary | ICD-10-CM | POA: Diagnosis not present

## 2019-03-06 DIAGNOSIS — Z1151 Encounter for screening for human papillomavirus (HPV): Secondary | ICD-10-CM | POA: Diagnosis not present

## 2019-03-06 NOTE — Telephone Encounter (Signed)
Pt was seen today by [facility] for vaginal bleeding. After her appointment she came upfront and said she needed an appointment to see Dr. Caryl Bis about insurance papers. Dr. Caryl Bis only has 15 min appointments available next week and I was not sure how soon papers needed to be completed.

## 2019-03-06 NOTE — Progress Notes (Signed)
Subjective:    Patient ID: Regina Nash, female    DOB: 07/01/1951, 68 y.o.   MRN: [id number]  HPI  Patient presents to clinic due to vaginal bleeding. Last menstrual cycle was 9 years ago. Bleeding appears to be old/brown-ish colored blood and she also has a cramping feeling.   Patient states there will be times she does notice a bleeding at other times she will bleed more, usually the large amount of bleeding will occur after she has been laying down.  States she did have a Pap smear in May 2019, results are reviewed and diagnosis is negative for lesions or malignancy and no HPV detected.  Patient states Dr. Caryl Bis did believe her cervix looked a little pale, she was sent to a specialist for specialist confirm that her exam looks normal at that time. Also reports hx of tilted uterus.   Patient Active Problem List   Diagnosis Date Noted  . Pain in both knees 09/24/2018  . Facet arthritis of lumbar region 07/14/2018  . Paroxysmal tachycardia ([place]) 05/14/2018  . Abnormal electrocardiogram 05/14/2018  . Hyperlipidemia 05/13/2018  . CKD (chronic kidney disease) stage 3, GFR 30-59 ml/min (HCC) 04/08/2018  . Neck pain 03/04/2018  . Compression fracture of first lumbar vertebra ([place]) 02/21/2018  . Positive colorectal cancer screening using Cologuard test 11/27/2017  . Encounter for general adult medical examination with abnormal findings 11/20/2017  . Abnormal appearance of cervix 11/20/2017  . Cutaneous horn 11/20/2017  . Chronic low back pain without sciatica 10/15/2017  . Sleep apnea 06/21/2016  . Depression 06/21/2016  . Urinary frequency 06/21/2016   Social History   Tobacco Use  . Smoking status: Current Every Day Smoker    Packs/day: 0.50    Types: Cigarettes  . Smokeless tobacco: Never Used  Substance Use Topics  . Alcohol use: Yes    Alcohol/week: 14.0 standard drinks    Types: 14 Cans of beer per week    Comment: two beers a day   Review of Systems   Constitutional: Negative for chills, fatigue and fever.  HENT: Negative for congestion, ear pain, sinus pain and sore throat.   Eyes: Negative.   Respiratory: Negative for cough, shortness of breath and wheezing.   Cardiovascular: Negative for chest pain, palpitations and leg swelling.  Gastrointestinal: Negative for abdominal pain, diarrhea, nausea and vomiting.  Genitourinary: Negative for dysuria, frequency and urgency. +cramping and vaginal bleeding Musculoskeletal: Negative for arthralgias and myalgias.  Skin: Negative for color change, pallor and rash.  Neurological: Negative for syncope, light-headedness and headaches.  Psychiatric/Behavioral: The patient is not nervous/anxious.       Objective:   Physical Exam Vitals signs and nursing note reviewed.  Constitutional:      General: She is not in acute distress.    Appearance: She is not ill-appearing, toxic-appearing or diaphoretic.  HENT:     Head: Normocephalic and atraumatic.  Eyes:     General: No scleral icterus.    Extraocular Movements: Extraocular movements intact.     Conjunctiva/sclera: Conjunctivae normal.     Pupils: Pupils are equal, round, and reactive to light.  Neck:     Musculoskeletal: Normal range of motion and neck supple.  Cardiovascular:     Rate and Rhythm: Normal rate and regular rhythm.     Heart sounds: Normal heart sounds.  Abdominal:     Hernia: There is no hernia in the left inguinal area or right inguinal area.  Genitourinary:    Labia:  Right: No rash, tenderness, lesion or injury.        Left: No rash, tenderness, lesion or injury.      Adnexa:        Right: No tenderness.         Left: No tenderness.       Comments: Pap collected and sent to lab No bleeding noted in vaginal vault at this time Lymphadenopathy:     Lower Body: No right inguinal adenopathy. No left inguinal adenopathy.  Skin:    General: Skin is warm and dry.     Coloration: Skin is not jaundiced or pale.   Neurological:     General: No focal deficit present.     Mental Status: She is alert and oriented to person, place, and time.  Psychiatric:        Mood and Affect: Mood normal.        Behavior: Behavior normal.     Vitals:   03/06/19 1505  BP: 130/88  Pulse: 92  Resp: 16  Temp: (!) 96.5 F (35.8 C)  SpO2: 95%      Assessment & Plan:    A total of 25 minutes were spent face-to-face with the patient during this encounter and over half of that time was spent on counseling and coordination of care. The patient was counseled on plan for work up and evaluation of the bleeding.    Postmenopausal bleeding - physical exam appears unremarkable at this time.  Due to reports of postmenopausal bleeding we will do further work-up including pelvic ultrasound with transvaginal views, referral to GYN and complete blood count with CMP.  Reassured patient that we are doing the best work-up possible to determine and rule out any severe medical causes for the postmenopausal bleeding.  Patient aware she will here early next week in regards to ultrasound appointment and should hear within the next 7 to 10 days about GYN referral.  Pap smear should result within the next week and lab work should be back by Monday.

## 2019-03-07 LAB — COMPREHENSIVE METABOLIC PANEL
AG Ratio: 1.4 (calc) (ref 1.0–2.5)
ALT: 39 U/L — ABNORMAL HIGH (ref 6–29)
AST: 26 U/L (ref 10–35)
Albumin: 3.9 g/dL (ref 3.6–5.1)
Alkaline phosphatase (APISO): 64 U/L (ref 37–153)
BUN: 12 mg/dL (ref 7–25)
CO2: 29 mmol/L (ref 20–32)
Calcium: 9.6 mg/dL (ref 8.6–10.4)
Chloride: 103 mmol/L (ref 98–110)
Creat: 0.97 mg/dL (ref 0.50–0.99)
Globulin: 2.7 g/dL (calc) (ref 1.9–3.7)
Glucose, Bld: 101 mg/dL — ABNORMAL HIGH (ref 65–99)
Potassium: 4 mmol/L (ref 3.5–5.3)
Sodium: 140 mmol/L (ref 135–146)
Total Bilirubin: 0.4 mg/dL (ref 0.2–1.2)
Total Protein: 6.6 g/dL (ref 6.1–8.1)

## 2019-03-07 LAB — CBC
HCT: 43.9 % (ref 35.0–45.0)
Hemoglobin: 14.4 g/dL (ref 11.7–15.5)
MCH: 30.3 pg (ref 27.0–33.0)
MCHC: 32.8 g/dL (ref 32.0–36.0)
MCV: 92.2 fL (ref 80.0–100.0)
MPV: 11.7 fL (ref 7.5–12.5)
Platelets: 203 10*3/uL (ref 140–400)
RBC: 4.76 10*6/uL (ref 3.80–5.10)
RDW: 12 % (ref 11.0–15.0)
WBC: 7.4 10*3/uL (ref 3.8–10.8)

## 2019-03-10 LAB — CYTOLOGY - PAP
Adequacy: ABSENT
Diagnosis: NEGATIVE
HPV: NOT DETECTED

## 2019-03-13 ENCOUNTER — Other Ambulatory Visit: Payer: Self-pay

## 2019-03-13 ENCOUNTER — Ambulatory Visit (INDEPENDENT_AMBULATORY_CARE_PROVIDER_SITE_OTHER): Payer: Medicare Other

## 2019-03-13 DIAGNOSIS — Z0000 Encounter for general adult medical examination without abnormal findings: Secondary | ICD-10-CM

## 2019-03-13 NOTE — Patient Instructions (Addendum)
  Regina Nash , Thank you for taking time to come for your Medicare Wellness Visit. I appreciate your ongoing commitment to your health goals. Please review the following plan we discussed and let me know if I can assist you in the future.   These are the goals we discussed: Goals    . Increase physical activity     Walk for exercise       This is a list of the screening recommended for you and due dates:  Health Maintenance  Topic Date Due  . Flu Shot  10/14/2019*  . Tetanus Vaccine  03/12/2020*  . Pneumonia vaccines (1 of 2 - PCV13) 03/12/2020*  . Mammogram  11/07/2019  . Cologuard (Stool DNA test)  11/12/2020  . DEXA scan (bone density measurement)  Completed  .  Hepatitis C: One time screening is recommended by Center for Disease Control  (CDC) for  adults born from [birth year] through 1963.   Completed  *Topic was postponed. The date shown is not the original due date.
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
## 2019-03-16 DIAGNOSIS — H2513 Age-related nuclear cataract, bilateral: Secondary | ICD-10-CM | POA: Diagnosis not present

## 2019-03-16 NOTE — Progress Notes (Signed)
Reviewed and agree with note  Philis Nettle FNP

## 2019-03-18 ENCOUNTER — Other Ambulatory Visit: Payer: Self-pay

## 2019-03-18 ENCOUNTER — Encounter: Payer: Self-pay | Admitting: Obstetrics and Gynecology

## 2019-03-18 ENCOUNTER — Ambulatory Visit (INDEPENDENT_AMBULATORY_CARE_PROVIDER_SITE_OTHER): Payer: Medicare Other | Admitting: Obstetrics and Gynecology

## 2019-03-18 VITALS — BP 127/83 | HR 87 | Ht 64.0 in | Wt 185.8 lb

## 2019-03-18 DIAGNOSIS — N950 Postmenopausal bleeding: Secondary | ICD-10-CM | POA: Diagnosis not present

## 2019-03-18 NOTE — Progress Notes (Signed)
Patient c/o postmenopausal bleeding "I wiped and saw brown watery discharge"  2 weeks ago and lasted 1 week.

## 2019-03-18 NOTE — Progress Notes (Signed)
HPI:      [name] is a 68 y.o. [id number] who LMP was Patient's last menstrual period was 02/16/2010 (exact date).  She is obviously menopausal  Subjective:   She presents today because a few weeks ago she had approximately 4 days of watery brownish discharge.  This is now resolved.  She states this is the first time it has happened during her menopause.  She is not sexually active. She recently had a "normal" Pap smear.  She reports that no polyp was found at the time of exam.    Hx: The following portions of the patient's history were reviewed and updated as appropriate:             She  has a past medical history of Allergy, Anxiety, Arrhythmia, Arthritis, Asthma, Chronic bronchitis ([place]), Chronic kidney disease, Depression, Genital warts, GERD (gastroesophageal reflux disease), History of recurrent UTIs, Hyperglycemia, Migraine, Sleep apnea, Substance abuse ([place]), and Urine incontinence. She does not have any pertinent problems on file. She  has a past surgical history that includes Tubal ligation. Her family history includes Alcohol abuse in her father and mother; Alcoholism in an other family member; Anxiety disorder in her mother, sister, and sister; Arthritis in an other family member; Breast cancer in her paternal aunt; Cancer in an other family member; Depression in her sister. She  reports that she has been smoking cigarettes. She has been smoking about 0.50 packs per day. She has never used smokeless tobacco. She reports current alcohol use of about 12.0 standard drinks of alcohol per week. She reports that she does not use drugs. She has a current medication list which includes the following prescription(s): calcium carbonate. She is allergic to codeine and erythromycin.       Review of Systems:  Review of Systems  Constitutional: Denied constitutional symptoms, night sweats, recent illness, fatigue, fever, insomnia and weight loss.  Eyes: Denied eye symptoms, eye pain,  photophobia, vision change and visual disturbance.  Ears/Nose/Throat/Neck: Denied ear, nose, throat or neck symptoms, hearing loss, nasal discharge, sinus congestion and sore throat.  Cardiovascular: Denied cardiovascular symptoms, arrhythmia, chest pain/pressure, edema, exercise intolerance, orthopnea and palpitations.  Respiratory: Denied pulmonary symptoms, asthma, pleuritic pain, productive sputum, cough, dyspnea and wheezing.  Gastrointestinal: Denied, gastro-esophageal reflux, melena, nausea and vomiting.  Genitourinary: See HPI for additional information.  Musculoskeletal: Denied musculoskeletal symptoms, stiffness, swelling, muscle weakness and myalgia.  Dermatologic: Denied dermatology symptoms, rash and scar.  Neurologic: Denied neurology symptoms, dizziness, headache, neck pain and syncope.  Psychiatric: Denied psychiatric symptoms, anxiety and depression.  Endocrine: Denied endocrine symptoms including hot flashes and night sweats.   Meds:   Current Outpatient Medications on File Prior to Visit  Medication Sig Dispense Refill  . calcium carbonate (TUMS - DOSED IN MG ELEMENTAL CALCIUM) 500 MG chewable tablet Chew 1 tablet by mouth daily.     No current facility-administered medications on file prior to visit.     Objective:     Vitals:   03/18/19 1357  BP: 127/83  Pulse: 87              Patient declined pelvic exam today  Assessment:    [id number] Patient Active Problem List   Diagnosis Date Noted  . Pain in both knees 09/24/2018  . Facet arthritis of lumbar region 07/14/2018  . Paroxysmal tachycardia ([place]) 05/14/2018  . Abnormal electrocardiogram 05/14/2018  . Hyperlipidemia 05/13/2018  . CKD (chronic kidney disease) stage 3, GFR 30-59 ml/min ([facility]) 04/08/2018  .  Neck pain 03/04/2018  . Compression fracture of first lumbar vertebra ([place]) 02/21/2018  . Positive colorectal cancer screening using Cologuard test 11/27/2017  . Encounter for general adult medical  examination with abnormal findings 11/20/2017  . Abnormal appearance of cervix 11/20/2017  . Cutaneous horn 11/20/2017  . Chronic low back pain without sciatica 10/15/2017  . Sleep apnea 06/21/2016  . Depression 06/21/2016  . Urinary frequency 06/21/2016     1. Postmenopausal bleeding     Single episode of postmenopausal bleeding.  Possibly atrophic vaginitis versus uterine bleeding which of course includes endometrial hyperplasia and malignancy.   Plan:            1.  We discussed ultrasound versus endometrial biopsy for diagnostic purposes and she has chosen an ultrasound.  She is aware that if her ultrasound is inconclusive she will need an endometrial biopsy.  Orders Orders Placed This Encounter  Procedures  . US PELVIS (TRANSABDOMINAL ONLY)    No orders of the defined types were placed in this encounter.     F/U  No follow-ups on file. I spent 22 minutes involved in the care of this patient of which greater than 50% was spent discussing postmenopausal bleeding, causes, work-up, future treatments if necessary.  All questions answered.  Finis Bud, M.D. 03/18/2019 2:39 PM

## 2019-03-19 ENCOUNTER — Ambulatory Visit: Payer: Medicare Other

## 2019-03-24 ENCOUNTER — Other Ambulatory Visit: Payer: Medicare Other

## 2019-03-25 ENCOUNTER — Other Ambulatory Visit: Payer: Self-pay

## 2019-03-25 ENCOUNTER — Encounter: Payer: Self-pay | Admitting: Family Medicine

## 2019-03-25 ENCOUNTER — Ambulatory Visit (INDEPENDENT_AMBULATORY_CARE_PROVIDER_SITE_OTHER): Payer: Medicare Other | Admitting: Family Medicine

## 2019-03-25 DIAGNOSIS — G8929 Other chronic pain: Secondary | ICD-10-CM | POA: Diagnosis not present

## 2019-03-25 DIAGNOSIS — M47816 Spondylosis without myelopathy or radiculopathy, lumbar region: Secondary | ICD-10-CM | POA: Diagnosis not present

## 2019-03-25 DIAGNOSIS — F331 Major depressive disorder, recurrent, moderate: Secondary | ICD-10-CM | POA: Diagnosis not present

## 2019-03-25 DIAGNOSIS — E669 Obesity, unspecified: Secondary | ICD-10-CM | POA: Diagnosis not present

## 2019-03-25 DIAGNOSIS — N950 Postmenopausal bleeding: Secondary | ICD-10-CM | POA: Diagnosis not present

## 2019-03-25 DIAGNOSIS — M545 Low back pain: Secondary | ICD-10-CM

## 2019-03-25 DIAGNOSIS — R195 Other fecal abnormalities: Secondary | ICD-10-CM | POA: Diagnosis not present

## 2019-03-25 NOTE — Assessment & Plan Note (Signed)
Discussed the importance of completing her evaluation through GYN.  She will keep her appointment for her ultrasound.

## 2019-03-25 NOTE — Assessment & Plan Note (Signed)
Patient has difficulty with exercising and making dietary changes given her back pain and her dental issues.  We will have her do physical therapy to see if we can get her more mobile to help with losing weight.  I will send her dietary guidelines.

## 2019-03-25 NOTE — Assessment & Plan Note (Deleted)
The patient continues to have chronic and frequent back pain.  She did not see physical therapy previously for evaluation and treatment and we will place that referral.  Given her ongoing back pain I am going to refer her to an orthopedist locally to get their input regarding her back pain and potential treatments and to help with ongoing disability determination.  I have encouraged the patient to contact her disability company so that she can be evaluated by one of their physicians as she outlined during our visit.  Given her apparent level of discomfort during our visit I do think it would be difficult for her to return to a physically demanding job and it will be helpful to get input from orthopedics and the disability company physician to help determine what she can do regarding worked in the future.  I did discuss starting on medication that may help with pain and her depression though she declined this.  Follow-up in 3 months.

## 2019-03-25 NOTE — Assessment & Plan Note (Signed)
This continues to be an issue.  I offered medication though she declined this.  We will refer her back for therapy.

## 2019-03-25 NOTE — Patient Instructions (Signed)
Diet Recommendations  Starchy (carb) foods: Bread, rice, pasta, potatoes, corn, cereal, grits, crackers, bagels, muffins, all baked goods.  (Fruits, milk, and yogurt also have carbohydrate, but most of these foods will not spike your blood sugar as the starchy foods will.)  A few fruits do cause high blood sugars; use small portions of bananas (limit to 1/2 at a time), grapes, watermelon, oranges, and most tropical fruits.    Protein foods: Meat, fish, poultry, eggs, dairy foods, and beans such as pinto and kidney beans (beans also provide carbohydrate).   1. Eat at least 3 meals and 1-2 snacks per day. Never go more than 4-5 hours while awake without eating. Eat breakfast within the first hour of getting up.   2. Limit starchy foods to TWO per meal and ONE per snack. ONE portion of a starchy  food is equal to the following:   - ONE slice of bread (or its equivalent, such as half of a hamburger bun).   - 1/2 cup of a "scoopable" starchy food such as potatoes or rice.   - 15 grams of carbohydrate as shown on food label.  3. Include at every meal: a protein food, a carb food, and vegetables and/or fruit.   - Obtain twice the volume of veg's as protein or carbohydrate foods for both lunch and dinner.   - Fresh or frozen veg's are best.   - Keep frozen veg's on hand for a quick vegetable serving.    

## 2019-03-25 NOTE — Assessment & Plan Note (Signed)
Discussed the need to have colonoscopy completed.  Referral placed to GI.

## 2019-03-25 NOTE — Assessment & Plan Note (Signed)
The patient continues to have chronic and frequent back pain.  She did not see physical therapy previously for evaluation and treatment and we will place that referral.  Given her ongoing back pain I am going to refer her to an orthopedist locally to get their input regarding her back pain and potential treatments and to help with ongoing disability determination.  I have encouraged the patient to contact her disability company so that she can be evaluated by one of their physicians as she outlined during our visit.  Given her apparent level of discomfort during our visit I do think it would be difficult for her to return to a physically demanding job and it will be helpful to get input from orthopedics and the disability company physician to help determine what she can do regarding worked in the future.  I did discuss starting on medication that may help with pain and her depression though she declined this.  Follow-up in 3 months.

## 2019-03-25 NOTE — Progress Notes (Signed)
Virtual Visit via video Note  This visit type was conducted due to national recommendations for restrictions regarding the COVID-19 pandemic (e.g. social distancing).  This format is felt to be most appropriate for this patient at this time.  All issues noted in this document were discussed and addressed.  No physical exam was performed (except for noted visual exam findings with Video Visits).   I connected with Regina Nash today at 11:30 AM EDT by a video enabled telemedicine application and verified that I am speaking with the correct person using two identifiers. Location patient: home Location provider: work  Persons participating in the virtual visit: patient, provider  I discussed the limitations, risks, security and privacy concerns of performing an evaluation and management service by telephone and the availability of in person appointments. I also discussed with the patient that there may be a patient responsible charge related to this service. The patient expressed understanding and agreed to proceed.   Reason for visit: follow-up she said she is depressed exam difficulty  HPI: Chronic back pain: Patient was to follow-up in July for her back pain though it does not appear this was scheduled after her last visit.  Patient notes she always has the pain.  She notes she is uncomfortable all the time.  She notes it has not changed since her last visit.  She notes laying down at this time it is 4-5 out of 10.  She notes it is exacerbated by standing in 1 place.  She has to try to keep moving and shifting to stay relatively comfortable.  She notes sitting in certain positions and laying in certain positions exacerbates the pain.  She notes doing pretty much anything for any length of time exacerbates the pain.  She notes she can sit for about 5 to 10 minutes prior to having to move.  She notes no radiation of the pain.  She has chronic intermittent left leg numbness that is unchanged.  No  weakness.  No incontinence.  She was referred for physical therapy for functional evaluation and treatment after her last visit and notes somebody did call her though she not call them back to schedule an appointment.  She does note physical therapy previously was helpful for her back.  She notes she was terminated from her job related to her back issues.  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  She notes she has spoken with her disability company and they offered to have her see 1 of their physicians.  She has not heard from them regarding this in about a month.  Positive Cologuard: Colonoscopy was canceled given COVID-19 pandemic.  She has not followed up on this since this was canceled.  Postmenopausal bleeding: She notes no additional bleeding.  She has seen gynecology and they are planning an ultrasound next week.  Depression: Patient notes this has been bad for a number of years.  She has been dealing with this most of her life.  She notes no SI.  She declines medication for this.  She was seeing a therapist though follow-up with them was deferred given the COVID-19 pandemic.  Obesity: She does note some weight gain over time.  She has difficulty doing any physical activity given her back and she has difficulty  eating very healthily given her poor dentition which she is unable to have taking care of at this time because it would require oral surgery and she is not ready to do that.   ROS: See pertinent positives and negatives per HPI.  Past Medical History:  Diagnosis Date   Allergy    Anxiety    Arrhythmia    Arthritis    Asthma    Chronic bronchitis ([facility])    Chronic kidney disease    Depression    Genital warts    GERD (gastroesophageal reflux disease)    History of recurrent UTIs    Hyperglycemia     Migraine    Sleep apnea    Substance abuse ([place])    Urine incontinence     Past Surgical History:  Procedure Laterality Date   TUBAL LIGATION      Family History  Problem Relation Age of Onset   Alcoholism Other        Parent   Arthritis Other        Parent   Cancer Other        Breast, ovarian/uterine   Breast cancer Paternal Aunt        60's   Alcohol abuse Mother    Anxiety disorder Mother    Alcohol abuse Father    Anxiety disorder Sister    Anxiety disorder Sister    Depression Sister    Colon cancer Neg Hx     SOCIAL HX: Smoker   Current Outpatient Medications:    calcium carbonate (TUMS - DOSED IN MG ELEMENTAL CALCIUM) 500 MG chewable tablet, Chew 1 tablet by mouth daily., Disp: , Rfl:   EXAM:  VITALS per patient if applicable: None.  GENERAL: alert, appears well  HEENT: atraumatic, conjunttiva clear, no obvious abnormalities on inspection of external nose and ears  NECK: normal movements of the head and neck  LUNGS: on inspection no signs of respiratory distress, breathing rate appears normal, no obvious gross SOB, gasping or wheezing  CV: no obvious cyanosis  MS: moves all visible extremities without noticeable abnormality, patient does appear uncomfortable after 2 to 4 minutes of being in the same position and has to shift positions fairly frequently throughout our visit  PSYCH/NEURO: pleasant and cooperative, no obvious depression or anxiety, speech and thought processing grossly intact  ASSESSMENT AND PLAN:  Discussed the following assessment and plan:  Chronic low back pain without sciatica The patient continues to have chronic and frequent back pain.  She did not see physical therapy previously for evaluation and treatment and we will place that referral.  Given her ongoing back pain I am going to refer her to an orthopedist locally to get their input regarding her back pain and potential treatments and to help with ongoing  disability determination.  I have encouraged the patient to contact her disability company so that she can be evaluated by one of their physicians as she outlined during our visit.  Given her apparent level of discomfort during our visit I do think it would be difficult for her to return to a physically demanding job and it will be helpful to get input from orthopedics and the disability company physician to help determine what she can do regarding worked in the future.  I did discuss starting on medication that may help with pain and her depression though she declined this.  Follow-up in 3 months.  Depression This continues to be an issue.  I offered medication though she  declined this.  We will refer her back for therapy.  Positive colorectal cancer screening using Cologuard test Discussed the need to have colonoscopy completed.  Referral placed to GI.  Postmenopausal bleeding Discussed the importance of completing her evaluation through GYN.  She will keep her appointment for her ultrasound.  Obesity (BMI 30.0-34.9) Patient has difficulty with exercising and making dietary changes given her back pain and her dental issues.  We will have her do physical therapy to see if we can get her more mobile to help with losing weight.  I will send her dietary guidelines.    I discussed the assessment and treatment plan with the patient. The patient was provided an opportunity to ask questions and all were answered. The patient agreed with the plan and demonstrated an understanding of the instructions.   The patient was advised to call back or seek an in-person evaluation if the symptoms worsen or if the condition fails to improve as anticipated.   Tommi Rumps, MD

## 2019-03-27 ENCOUNTER — Telehealth: Payer: Self-pay

## 2019-03-27 NOTE — Telephone Encounter (Signed)
Copied from [place] [id number]. Topic: General - Other >> Mar 27, 2019  9:57 AM Mathis Bud wrote: Reason for CRM: [place] from [facility] called stating patient denied referral to be send by Dr.Dumonski. Patient denied due to patient did not want to get any back injections. [facility] # [phone]

## 2019-03-27 NOTE — Telephone Encounter (Signed)
[place] from [facility] called stating patient denied referral to be send by Dr.Dumonski. Patient denied due to patient did not want to get any back injections. [facility] # [phone]

## 2019-03-27 NOTE — Telephone Encounter (Signed)
Mailbox full, will try later.  Regina Nash,cma

## 2019-03-27 NOTE — Telephone Encounter (Signed)
Called the patient per the provider about her ortho referral and received a message that the mailbox was full and could not accept anymore messages.  Nina,cma

## 2019-03-27 NOTE — Telephone Encounter (Signed)
Noted. Please call the patient. The referral was supposed to be sent locally for her to be evaluated for her back pain to see if there was any other option for treatment. I would like her to see orthopedics as well to help determine what level of disability she has and see if she meets criteria to continue on her long term disability. We can send the referral to a local orthopedist once you speak with her.

## 2019-04-02 ENCOUNTER — Ambulatory Visit (INDEPENDENT_AMBULATORY_CARE_PROVIDER_SITE_OTHER): Payer: Medicare Other

## 2019-04-02 ENCOUNTER — Other Ambulatory Visit: Payer: Self-pay

## 2019-04-02 DIAGNOSIS — N950 Postmenopausal bleeding: Secondary | ICD-10-CM | POA: Diagnosis not present

## 2019-04-02 DIAGNOSIS — L814 Other melanin hyperpigmentation: Secondary | ICD-10-CM | POA: Diagnosis not present

## 2019-04-02 DIAGNOSIS — L738 Other specified follicular disorders: Secondary | ICD-10-CM | POA: Diagnosis not present

## 2019-04-02 DIAGNOSIS — L820 Inflamed seborrheic keratosis: Secondary | ICD-10-CM | POA: Diagnosis not present

## 2019-04-02 DIAGNOSIS — B078 Other viral warts: Secondary | ICD-10-CM | POA: Diagnosis not present

## 2019-04-02 DIAGNOSIS — L570 Actinic keratosis: Secondary | ICD-10-CM | POA: Diagnosis not present

## 2019-04-02 DIAGNOSIS — L578 Other skin changes due to chronic exposure to nonionizing radiation: Secondary | ICD-10-CM | POA: Diagnosis not present

## 2019-04-02 DIAGNOSIS — L821 Other seborrheic keratosis: Secondary | ICD-10-CM | POA: Diagnosis not present

## 2019-04-07 NOTE — Telephone Encounter (Addendum)
Referral has already been sent to [facility].  Please let the patient know this and if she has not heard anything from them in the next week she should contact us.

## 2019-04-07 NOTE — Telephone Encounter (Signed)
Called and spoke with patient and she states she would like a local orthopedic so you can send the referral.  Nina,cma

## 2019-04-08 ENCOUNTER — Telehealth: Payer: Self-pay

## 2019-04-08 DIAGNOSIS — M545 Low back pain: Secondary | ICD-10-CM | POA: Diagnosis not present

## 2019-04-08 NOTE — Telephone Encounter (Signed)
Regina Nash will be faxing over document for long term disability for pt.  Regina Nash, [employer]. Best contact: [phone].  ,cma

## 2019-04-08 NOTE — Telephone Encounter (Signed)
Copied from [place] [id number]. Topic: General - Other >> Apr 08, 2019 12:02 PM Erick Blinks wrote: Reason for CRM: Benjamine Mola will be faxing over document for long term disability for pt.  Benjamine Mola, [employer] Best contact: [phone]

## 2019-04-09 NOTE — Telephone Encounter (Signed)
I will await this form.

## 2019-04-16 ENCOUNTER — Encounter: Payer: Self-pay | Admitting: *Deleted

## 2019-04-18 ENCOUNTER — Telehealth: Payer: Self-pay | Admitting: Family Medicine

## 2019-04-18 NOTE — Telephone Encounter (Signed)
Entered in delay. Spoke with the patient on 04/17/19 around 5:30 pm. Completed patients [place] paperwork. She asked about her [place] result from GYN and I relayed the findings that are in the report. I advised her to contact her GYN to determine the next step. I also advised the patient that she appeared to have an appointment with [facility] neurosurgery on 04/21/19. I will have someone in our office reach out to them to confirm this. The patient will call our office if she has not heard anything regarding this by lunch time on Monday.

## 2019-04-21 DIAGNOSIS — M545 Low back pain: Secondary | ICD-10-CM | POA: Diagnosis not present

## 2019-04-21 DIAGNOSIS — G8929 Other chronic pain: Secondary | ICD-10-CM | POA: Diagnosis not present

## 2019-04-24 DIAGNOSIS — M545 Low back pain: Secondary | ICD-10-CM | POA: Diagnosis not present

## 2019-04-28 DIAGNOSIS — M545 Low back pain: Secondary | ICD-10-CM | POA: Diagnosis not present

## 2019-05-01 DIAGNOSIS — M545 Low back pain: Secondary | ICD-10-CM | POA: Diagnosis not present

## 2019-05-05 DIAGNOSIS — G8929 Other chronic pain: Secondary | ICD-10-CM | POA: Diagnosis not present

## 2019-05-05 DIAGNOSIS — M545 Low back pain: Secondary | ICD-10-CM | POA: Diagnosis not present

## 2019-05-07 ENCOUNTER — Telehealth: Payer: Self-pay

## 2019-05-07 NOTE — Telephone Encounter (Signed)
Called pt to inquire if she's interested in following up with Dr.Anna and proceeding with upper and lower endoscopy has planned. When we last spoke with pt she stated she wanted to hold off on the procedure until her cardiac work up was completed. Pt was cleared by Dr. Rockey Situ but did not proceed with scheduling the procedure as planned.  Unable to contact. LVM to return call

## 2019-05-12 DIAGNOSIS — M545 Low back pain: Secondary | ICD-10-CM | POA: Diagnosis not present

## 2019-05-15 DIAGNOSIS — M545 Low back pain: Secondary | ICD-10-CM | POA: Diagnosis not present

## 2019-05-21 ENCOUNTER — Telehealth: Payer: Self-pay | Admitting: Family Medicine

## 2019-05-21 DIAGNOSIS — G8929 Other chronic pain: Secondary | ICD-10-CM

## 2019-05-21 NOTE — Telephone Encounter (Signed)
Patient was to have a functional assessment through [facility]. I have not received a report of this at this time. Can you contact them and see if they have completed this? If they have can they fax the report to [place]. If they have not can you ask that they complete a functional assessment of the patient for her disability paperwork. We can send another referral if needed. Thanks.

## 2019-05-22 NOTE — Telephone Encounter (Signed)
I called and spoke to Secretary @ [facility] and she stated that they do the functional test on [place] and the pateint would have to pay out of pocket $550.00 she states for you to put in the referral and she would call a lady name Olivia Mackie @ the [facility]. Office and she would call the patient and talk to her about the payment and work out an arrangement with her.  [facility]. Office Fax number is [phone].  Finbar Nippert,cma

## 2019-05-22 NOTE — Addendum Note (Signed)
Addended by: Leone Haven on: 05/22/2019 09:27 AM   Modules accepted: Orders

## 2019-05-22 NOTE — Telephone Encounter (Signed)
Referral placed.

## 2019-05-27 ENCOUNTER — Encounter: Payer: Self-pay | Admitting: Family Medicine

## 2019-06-04 ENCOUNTER — Encounter: Payer: Self-pay | Admitting: Family Medicine

## 2019-06-07 ENCOUNTER — Other Ambulatory Visit: Payer: Self-pay | Admitting: Internal Medicine

## 2019-06-07 DIAGNOSIS — Z7689 Persons encountering health services in other specified circumstances: Secondary | ICD-10-CM

## 2019-06-07 DIAGNOSIS — M545 Low back pain, unspecified: Secondary | ICD-10-CM

## 2019-06-07 DIAGNOSIS — G8929 Other chronic pain: Secondary | ICD-10-CM

## 2019-06-10 ENCOUNTER — Telehealth: Payer: Self-pay | Admitting: Family Medicine

## 2019-06-10 NOTE — Telephone Encounter (Signed)
Benjamine Mola, from the [employer], called stating she is needing the results from pts functional capacity evaluation. She states she has faxed over the request. Please advise.    [phone]

## 2019-06-10 NOTE — Telephone Encounter (Signed)
I called Regina Nash and left her a message informing her that the patient has not had the functional capacity eval done yet the patient is scheduling that appt soon.  Kitzia Camus,cma

## 2019-06-15 NOTE — Telephone Encounter (Signed)
Please contact them again and let Benjamine Mola know that the patient is not going to be able to complete the functional capacity evaluation due to it being cost prohibitive.  Thanks.

## 2019-06-15 NOTE — Telephone Encounter (Signed)
Left message to return my call.  Winnie Umali,cma

## 2019-06-17 ENCOUNTER — Encounter: Payer: Self-pay | Admitting: Internal Medicine

## 2019-06-17 ENCOUNTER — Other Ambulatory Visit: Payer: Self-pay

## 2019-06-17 ENCOUNTER — Ambulatory Visit (INDEPENDENT_AMBULATORY_CARE_PROVIDER_SITE_OTHER): Payer: Medicare Other | Admitting: Internal Medicine

## 2019-06-17 VITALS — Ht 64.0 in | Wt 185.0 lb

## 2019-06-17 DIAGNOSIS — G4730 Sleep apnea, unspecified: Secondary | ICD-10-CM

## 2019-06-17 DIAGNOSIS — Z1231 Encounter for screening mammogram for malignant neoplasm of breast: Secondary | ICD-10-CM | POA: Diagnosis not present

## 2019-06-17 DIAGNOSIS — J42 Unspecified chronic bronchitis: Secondary | ICD-10-CM | POA: Insufficient documentation

## 2019-06-17 DIAGNOSIS — R7303 Prediabetes: Secondary | ICD-10-CM | POA: Insufficient documentation

## 2019-06-17 DIAGNOSIS — F4310 Post-traumatic stress disorder, unspecified: Secondary | ICD-10-CM | POA: Diagnosis not present
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
## 2019-06-17 NOTE — Patient Instructions (Signed)
Your annual mammogram has been ordered.  You are encouraged (required) to call to make your appointment at [facility]    Here are the names of several well respected female therapists in the area who can help you overcome your PTSD  Lennon Alstrom    [phone] [place]   [phone] [place] [phone]  Achilles Dunk  [phone]  Jerel Shepherd    [phone] [place]letus Gash [phone] [place],  PsyD [phone] Miguel Dibble [phone] Marjie Skiff, [place]   [phone]

## 2019-06-17 NOTE — Assessment & Plan Note (Signed)
With recent exacerbation now resolving.  Encouraged her to quit smoking,  Use saline irrigation
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
## 2019-06-17 NOTE — Progress Notes (Signed)
Morning phlegm is sometimes green and sometimes clear. Pt stated that she did have sinus pressure and pain, sore throat but seems to be getting better.

## 2019-06-17 NOTE — Assessment & Plan Note (Signed)
She reports episodes of hypglycemia brought on by poor diet .  Encouraged her to exercise regularly (des[pite her back pain :  She is currenlty sedentary) and follow a low GI diet

## 2019-06-17 NOTE — Assessment & Plan Note (Signed)
Untreated due to fear of suffocation.  Anxiolytics offered but declined as she fears apna. Needs PTSD managed

## 2019-06-18 NOTE — Telephone Encounter (Signed)
Regina Nash has been made aware of update.

## 2019-06-19 ENCOUNTER — Encounter: Payer: Self-pay | Admitting: Family Medicine

## 2019-06-29 ENCOUNTER — Ambulatory Visit: Payer: Medicare Other | Admitting: Family Medicine

## 2019-06-29 ENCOUNTER — Telehealth: Payer: Self-pay

## 2019-06-29 DIAGNOSIS — M533 Sacrococcygeal disorders, not elsewhere classified: Secondary | ICD-10-CM

## 2019-06-29 DIAGNOSIS — M25551 Pain in right hip: Secondary | ICD-10-CM

## 2019-06-29 NOTE — Telephone Encounter (Signed)
Copied from [place] [id number]. Topic: General - Other >> Jun 29, 2019  4:20 PM Wynetta Emery, [place] C wrote: Reason for CRM: pt called in to reschedule her follow up, advised pt of PCP next available, she would like to know if PCP could see her sooner if possible?   Pt says that the follow up is in regards to concern that she's not sure that PCP would want to wait for?

## 2019-06-30 NOTE — Telephone Encounter (Signed)
This patient is stating she is having back and hip pain, she wanted a follow up appointment with you sooner than her 07/28/2018 appointment. I scheduled her follow up for 07/14/2019 and it is virtual but she would like an xray before this visit so that you can address her  right hip and  lower back pain,I spoke to [place] about this and she said if you are OK in ordering the xray she should have it done at the hospital, if you want the xray before her visit.  Please advise.   Elijahjames Fuelling,cma

## 2019-06-30 NOTE — Telephone Encounter (Signed)
Please see if she can describe the exact location of her pain in her back. This will help me determine if she needs a lumbar film or sacral film.

## 2019-06-30 NOTE — Telephone Encounter (Signed)
She states sacral because its near the tailbone.  Jolleen Seman,cma

## 2019-07-01 ENCOUNTER — Ambulatory Visit
Admission: RE | Admit: 2019-07-01 | Discharge: 2019-07-01 | Disposition: A | Discharge status: Home / Self Care | Payer: Medicare Other | Source: Ambulatory Visit | Attending: Family Medicine | Admitting: Family Medicine

## 2019-07-01 DIAGNOSIS — M533 Sacrococcygeal disorders, not elsewhere classified: Secondary | ICD-10-CM | POA: Insufficient documentation

## 2019-07-01 DIAGNOSIS — M25551 Pain in right hip: Secondary | ICD-10-CM | POA: Diagnosis present

## 2019-07-01 NOTE — Telephone Encounter (Signed)
X-rays ordered.  These can be done at the medical mall at [facility].

## 2019-07-01 NOTE — Telephone Encounter (Signed)
Called and informed the patient that the x-ray for her hip and back was ordered and to go to the medical mall at [facility].  Pt understood.  Teriana Danker,cma

## 2019-07-01 NOTE — Addendum Note (Signed)
Addended by: Leone Haven on: 07/01/2019 08:51 AM   Modules accepted: Orders

## 2019-07-14 ENCOUNTER — Other Ambulatory Visit: Payer: Self-pay

## 2019-07-14 ENCOUNTER — Encounter: Payer: Self-pay | Admitting: Family Medicine

## 2019-07-14 ENCOUNTER — Ambulatory Visit (INDEPENDENT_AMBULATORY_CARE_PROVIDER_SITE_OTHER): Payer: Medicare Other | Admitting: Family Medicine

## 2019-07-14 VITALS — Ht 64.0 in | Wt 185.0 lb

## 2019-07-14 DIAGNOSIS — M545 Low back pain, unspecified: Secondary | ICD-10-CM

## 2019-07-14 DIAGNOSIS — M25551 Pain in right hip: Secondary | ICD-10-CM | POA: Diagnosis not present

## 2019-07-14 DIAGNOSIS — N950 Postmenopausal bleeding: Secondary | ICD-10-CM | POA: Diagnosis not present

## 2019-07-14 DIAGNOSIS — R1011 Right upper quadrant pain: Secondary | ICD-10-CM | POA: Diagnosis not present

## 2019-07-14 DIAGNOSIS — G8929 Other chronic pain: Secondary | ICD-10-CM

## 2019-07-14 NOTE — Progress Notes (Signed)
Virtual Visit via telephone Note  This visit type was conducted due to national recommendations for restrictions regarding the COVID-19 pandemic (e.g. social distancing).  This format is felt to be most appropriate for this patient at this time.  All issues noted in this document were discussed and addressed.  No physical exam was performed (except for noted visual exam findings with Video Visits).   I connected with Regina Nash today at 11:00 AM EST by telephone and verified that I am speaking with the correct person using two identifiers. Location patient: home Location provider: work  Persons participating in the virtual visit: patient, provider  I discussed the limitations, risks, security and privacy concerns of performing an evaluation and management service by telephone and the availability of in person appointments. I also discussed with the patient that there may be a patient responsible charge related to this service. The patient expressed understanding and agreed to proceed.  Interactive audio and video telecommunications were attempted between this provider and patient, however failed, due to patient having technical difficulties OR patient did not have access to video capability.  We continued and completed visit with audio only.   Reason for visit: follow-up  HPI: Right hip pain: Patient notes this occurs laterally.  She had x-rays that did not reveal any significant arthritic changes.  She is reluctant to have any injections.  She does not want any oral steroids.  She was going to physical therapy though they were not working with her on her hip.  She notes laying on her hip hurts the most.  Back pain: Patient notes this is stable.  She folded a load of clothes yesterday and this was quite painful after about 5 minutes.  She has a prescription for a TENS unit through physical therapy.  She has not started to use this.  She has taken a break from physical therapy and is going to  start in the Nash year.  She does not want surgery or back injections at this time until she has exhausted her conservative treatments.  She has seen those things not work in other people.  Patient was previously on long-term disability for her back pain.  She notes this ended just prior to Christmas based on recommendations from me and her neurosurgeon and she notes the disability company advised her that they were advised the patient had no restrictions.  Right upper quadrant pain: Patient notes she has had a recurring pain in this area for quite some time.  Notes it is typically intermittent though she felt ill several weeks ago and it was constant when she was sick.  No fevers with that.  No cough.  Food does not trigger the pain.  It feels as though it is a spasm.  No nausea or vomiting.  No diarrhea.  No blood in her stool.  Pelvic ultrasound results: Patient underwent pelvic ultrasound through gynecology.  She wondered why they were not able to find her right ovary.   ROS: See pertinent positives and negatives per HPI.  Past Medical History:  Diagnosis Date  . Allergy   . Anxiety   . Arrhythmia   . Arthritis   . Asthma   . Chronic bronchitis ([place])   . Chronic kidney disease   . Depression   . Genital warts   . GERD (gastroesophageal reflux disease)   . History of recurrent UTIs   . Hyperglycemia   . Migraine   . Sleep apnea   . Substance abuse (  [place])   . Urine incontinence     Past Surgical History:  Procedure Laterality Date  . TUBAL LIGATION      Family History  Problem Relation Age of Onset  . Alcoholism Other        Parent  . Arthritis Other        Parent  . Cancer Other        Breast, ovarian/uterine  . Breast cancer Paternal Aunt        60's  . Alcohol abuse Mother   . Anxiety disorder Mother   . Alcohol abuse Father   . Anxiety disorder Sister   . Anxiety disorder Sister   . Depression Sister   . Colon cancer Neg Hx     SOCIAL HX: Smoker   Current  Outpatient Medications:  .  calcium carbonate (TUMS - DOSED IN MG ELEMENTAL CALCIUM) 500 MG chewable tablet, Chew 1 tablet by mouth daily., Disp: , Rfl:   EXAM: This is a telehealth telephone visit and thus no physical exam was completed.  ASSESSMENT AND PLAN:  Discussed the following assessment and plan:  Chronic low back pain without sciatica Chronic issue.  She will restart physical therapy in the [place] year.  I did discuss that injections can be quite helpful for her back pain though there are some people that they do not help.  Also discussed that surgery can be helpful though there can also be long-term issues after having a surgery.  She would like to continue with conservative measures first.  I discussed with the patient that my recommendation to her disability company was that she could go back to sedentary work.  She was unable to complete the functional evaluation due to cost issues.  She did ask if I would be able to relay my recommendations again to the disability company if she appealed and I advised that I could relay what I had previously relayed to them.  Right hip pain Seems to be bursitis related.  Discussed that an injection in that area by orthopedics would be beneficial likely though she defers this.  Offered a course of oral steroids though she declines that as well.  We will have her complete physical therapy for her hip and see if that is beneficial.  Right upper quadrant pain Undetermined cause.  Will obtain lab work and an ultrasound.  Postmenopausal bleeding She completed evaluation with GYN.  Her right ovary was not visualized.  I discussed that this may have been a technical imaging issue where they were not able to get it into view though advised I would check with her GYN as well. She has no history of ovarian removal.   I discussed the assessment and treatment plan with the patient. The patient was provided an opportunity to ask questions and all were answered.  The patient agreed with the plan and demonstrated an understanding of the instructions.   The patient was advised to call back or seek an in-person evaluation if the symptoms worsen or if the condition fails to improve as anticipated.  I provided 23 minutes of non-face-to-face time during this encounter.   Tommi Rumps, MD

## 2019-07-15 DIAGNOSIS — M25551 Pain in right hip: Secondary | ICD-10-CM | POA: Insufficient documentation

## 2019-07-15 DIAGNOSIS — R1011 Right upper quadrant pain: Secondary | ICD-10-CM | POA: Insufficient documentation

## 2019-07-15 NOTE — Assessment & Plan Note (Signed)
Undetermined cause.  Will obtain lab work and an ultrasound.

## 2019-07-15 NOTE — Assessment & Plan Note (Signed)
Seems to be bursitis related.  Discussed that an injection in that area by orthopedics would be beneficial likely though she defers this.  Offered a course of oral steroids though she declines that as well.  We will have her complete physical therapy for her hip and see if that is beneficial.

## 2019-07-15 NOTE — Assessment & Plan Note (Addendum)
Chronic issue.  She will restart physical therapy in the new year.  I did discuss that injections can be quite helpful for her back pain though there are some people that they do not help.  Also discussed that surgery can be helpful though there can also be long-term issues after having a surgery.  She would like to continue with conservative measures first.  I discussed with the patient that my recommendation to her disability company was that she could go back to sedentary work.  She was unable to complete the functional evaluation due to cost issues.  She did ask if I would be able to relay my recommendations again to the disability company if she appealed and I advised that I could relay what I had previously relayed to them.

## 2019-07-15 NOTE — Assessment & Plan Note (Signed)
She completed evaluation with GYN.  Her right ovary was not visualized.  I discussed that this may have been a technical imaging issue where they were not able to get it into view though advised I would check with her GYN as well.

## 2019-07-21 ENCOUNTER — Other Ambulatory Visit
Admission: RE | Admit: 2019-07-21 | Discharge: 2019-07-21 | Disposition: A | Discharge status: Home / Self Care | Payer: Medicare Other | Source: Ambulatory Visit | Attending: Family Medicine | Admitting: Family Medicine

## 2019-07-21 DIAGNOSIS — R1011 Right upper quadrant pain: Secondary | ICD-10-CM | POA: Diagnosis not present

## 2019-07-21 LAB — CBC WITH DIFFERENTIAL/PLATELET
Abs Immature Granulocytes: 0.02 10*3/uL (ref 0.00–0.07)
Basophils Absolute: 0.1 10*3/uL (ref 0.0–0.1)
Basophils Relative: 1 %
Eosinophils Absolute: 0.3 10*3/uL (ref 0.0–0.5)
Eosinophils Relative: 5 %
HCT: 44 % (ref 36.0–46.0)
Hemoglobin: 14.4 g/dL (ref 12.0–15.0)
Immature Granulocytes: 0 %
Lymphocytes Relative: 33 %
Lymphs Abs: 2.3 10*3/uL (ref 0.7–4.0)
MCH: 30.8 pg (ref 26.0–34.0)
MCHC: 32.7 g/dL (ref 30.0–36.0)
MCV: 94 fL (ref 80.0–100.0)
Monocytes Absolute: 0.7 10*3/uL (ref 0.1–1.0)
Monocytes Relative: 9 %
Neutro Abs: 3.7 10*3/uL (ref 1.7–7.7)
Neutrophils Relative %: 52 %
Platelets: 207 10*3/uL (ref 150–400)
RBC: 4.68 MIL/uL (ref 3.87–5.11)
RDW: 12.2 % (ref 11.5–15.5)
WBC: 7.1 10*3/uL (ref 4.0–10.5)
nRBC: 0 % (ref 0.0–0.2)

## 2019-07-21 LAB — COMPREHENSIVE METABOLIC PANEL
ALT: 44 U/L (ref 0–44)
AST: 32 U/L (ref 15–41)
Albumin: 3.9 g/dL (ref 3.5–5.0)
Alkaline Phosphatase: 70 U/L (ref 38–126)
Anion gap: 9 (ref 5–15)
BUN: 11 mg/dL (ref 8–23)
CO2: 28 mmol/L (ref 22–32)
Calcium: 9.7 mg/dL (ref 8.9–10.3)
Chloride: 101 mmol/L (ref 98–111)
Creatinine, Ser: 0.89 mg/dL (ref 0.44–1.00)
GFR calc Af Amer: >60 mL/min (ref >60)
GFR calc non Af Amer: >60 mL/min (ref >60)
Glucose, Bld: 104 mg/dL — ABNORMAL HIGH (ref 70–99)
Potassium: 4 mmol/L (ref 3.5–5.1)
Sodium: 138 mmol/L (ref 135–145)
Total Bilirubin: 0.6 mg/dL (ref 0.3–1.2)
Total Protein: 7.2 g/dL (ref 6.5–8.1)

## 2019-07-23 ENCOUNTER — Encounter: Payer: Self-pay | Admitting: Family Medicine

## 2019-07-25 ENCOUNTER — Other Ambulatory Visit: Payer: Self-pay | Admitting: Family Medicine

## 2019-07-25 DIAGNOSIS — R1011 Right upper quadrant pain: Secondary | ICD-10-CM

## 2019-08-16 ENCOUNTER — Telehealth: Payer: Self-pay | Admitting: Family Medicine

## 2019-08-16 NOTE — Telephone Encounter (Signed)
-----   Message from Harlin Heys, MD sent at 08/13/2019  2:17 PM EST ----- Because menopausal ovaries are so small we often do not see them at ultrasound.  This is not a problem and is considered a normal finding.  When it comes to menopausal patients we are much more concerned with ovaries that we do see especially with cystic enlargement. The bottom line is I have no concern about her missing ovary at ultrasound.  Thank you for asking ----- Message ----- From: Leone Haven, MD Sent: 07/15/2019  12:22 PM EST To: Harlin Heys, MD  Hi Dr Amalia Hailey,   I saw Mrs Lofink recently for follow-up. She had a [place] to evaluate for postmenopausal bleeding earlier this year with you. She noted that her right ovary was not visualized and she wondered why. I discussed that this could have been a technical imaging issue and it may not have been in an area that was visualized during the exam though advised her that I would check with you as she was concerned about the ovary not being seen.  Please let me know your thoughts.  Thank you for your help.Randall Hiss

## 2019-08-16 NOTE — Telephone Encounter (Signed)
Please let the patient know that I heard back from her GYN regarding her prior US. He noted he was not worried about them not seeing one of her ovaries and this would be considered a normal finding in menopausal patients as they often do not see ovaries in menopausal patients due to them being so small.

## 2019-08-17 NOTE — Telephone Encounter (Signed)
I called and spoke to the patient and informed her of her [place] and she stated she had not f/up on the [place] because her husband is in the hospital he had a triple bypass.  She will f/up on that when things settle down he will come home the end of the week.  Regina Nash,cma

## 2019-08-17 NOTE — Telephone Encounter (Signed)
Noted  

## 2019-09-07 ENCOUNTER — Ambulatory Visit: Payer: Medicare Other

## 2019-09-07 ENCOUNTER — Encounter: Payer: Self-pay | Admitting: Family Medicine

## 2019-09-18 ENCOUNTER — Ambulatory Visit
Admission: RE | Admit: 2019-09-18 | Discharge: 2019-09-18 | Disposition: A | Discharge status: Home / Self Care | Payer: Medicare Other | Source: Ambulatory Visit | Attending: Family Medicine | Admitting: Family Medicine

## 2019-09-18 ENCOUNTER — Other Ambulatory Visit: Payer: Self-pay

## 2019-09-18 DIAGNOSIS — R1011 Right upper quadrant pain: Secondary | ICD-10-CM | POA: Diagnosis not present

## 2019-09-18 DIAGNOSIS — K760 Fatty (change of) liver, not elsewhere classified: Secondary | ICD-10-CM | POA: Diagnosis not present

## 2019-09-19 ENCOUNTER — Encounter: Payer: Self-pay | Admitting: Family Medicine

## 2019-09-20 ENCOUNTER — Encounter: Payer: Self-pay | Admitting: Family Medicine

## 2019-09-20 DIAGNOSIS — R1011 Right upper quadrant pain: Secondary | ICD-10-CM

## 2019-09-20 DIAGNOSIS — K760 Fatty (change of) liver, not elsewhere classified: Secondary | ICD-10-CM

## 2019-09-23 ENCOUNTER — Other Ambulatory Visit
Admission: RE | Admit: 2019-09-23 | Discharge: 2019-09-23 | Disposition: A | Discharge status: Home / Self Care | Payer: Medicare Other | Source: Ambulatory Visit | Attending: Family Medicine | Admitting: Family Medicine

## 2019-09-23 DIAGNOSIS — R1011 Right upper quadrant pain: Secondary | ICD-10-CM | POA: Diagnosis not present

## 2019-09-23 LAB — LIPASE, BLOOD: Lipase: 51 U/L (ref 11–51)

## 2019-09-23 NOTE — Addendum Note (Signed)
Addended by: Leone Haven on: 09/23/2019 01:12 PM   Modules accepted: Orders

## 2019-09-29 ENCOUNTER — Ambulatory Visit: Payer: Medicare Other

## 2019-09-29 ENCOUNTER — Ambulatory Visit
Admission: RE | Admit: 2019-09-29 | Discharge: 2019-09-29 | Disposition: A | Discharge status: Home / Self Care | Payer: Medicare Other | Source: Ambulatory Visit | Attending: Family Medicine | Admitting: Family Medicine

## 2019-09-29 ENCOUNTER — Other Ambulatory Visit: Payer: Self-pay

## 2019-09-29 DIAGNOSIS — K8020 Calculus of gallbladder without cholecystitis without obstruction: Secondary | ICD-10-CM | POA: Diagnosis not present

## 2019-09-29 DIAGNOSIS — R1011 Right upper quadrant pain: Secondary | ICD-10-CM | POA: Insufficient documentation

## 2019-09-29 DIAGNOSIS — K760 Fatty (change of) liver, not elsewhere classified: Secondary | ICD-10-CM | POA: Diagnosis not present

## 2019-10-04 ENCOUNTER — Other Ambulatory Visit: Payer: Self-pay | Admitting: Family Medicine

## 2019-10-04 DIAGNOSIS — K8020 Calculus of gallbladder without cholecystitis without obstruction: Secondary | ICD-10-CM

## 2019-10-10 IMAGING — CT CT HEART SCORING
2 series · 16 of 20 positions shown, 18 images · non-contrast
Comparison: None.

Addendum:
EXAM:
OVER-READ INTERPRETATION  CT CHEST

The following report is an over-read performed by radiologist Dr.
Sacheus Ernestu [REDACTED] on 06/18/2018. This
over-read does not include interpretation of cardiac or coronary
anatomy or pathology. The coronary calcium score interpretation by
the cardiologist is attached.
CLINICAL DATA: Risk stratification
Coronary Calcium Score
TECHNIQUE: The patient was scanned on a Siemens Somatom 64 slice scanner. Axial
non-contrast 3 mm slices were carried out through the heart. The
data set was analyzed on a dedicated work station and scored using
the Agatson method.

[Series 2: casc 3.0 i36f 2 bestdiast 69 % · axial · 0.36mm/px · z∈[-249,-150]mm · 8 of 43 slices shown, 10 images]
[im 5/43  vessel]
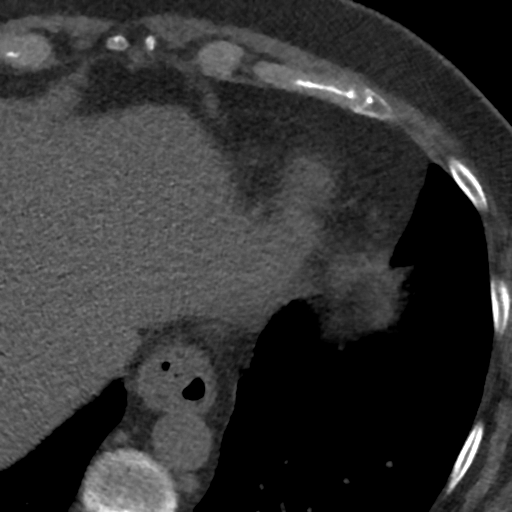
[im 5/43  lung]
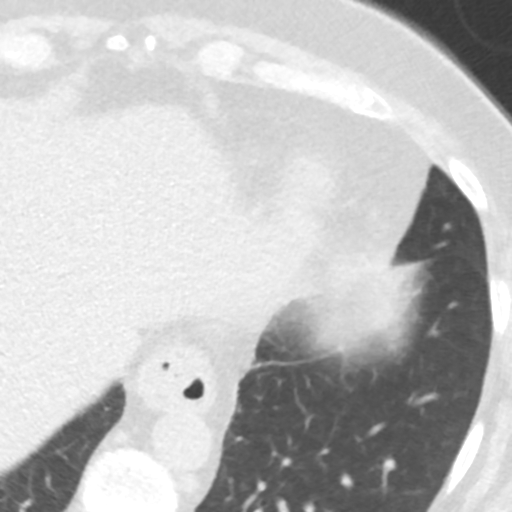
[im 10/43  vessel]
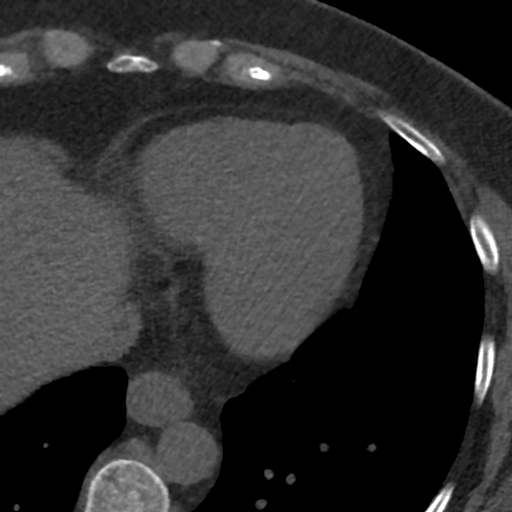
[im 15/43  vessel]
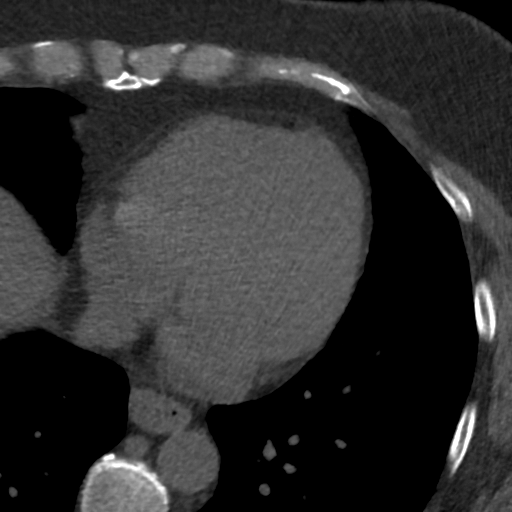
[im 19/43  vessel]
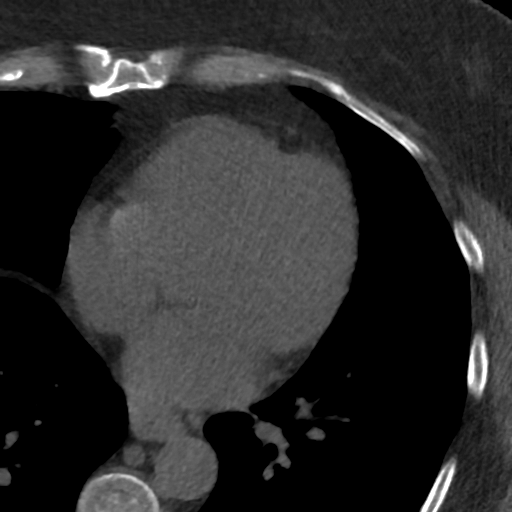
[im 24/43  vessel]
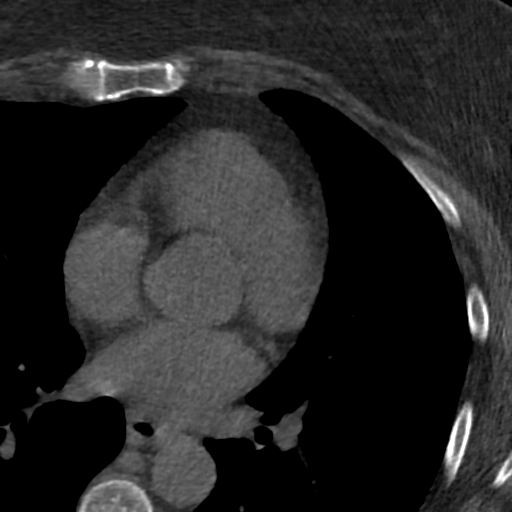
[im 24/43  lung]
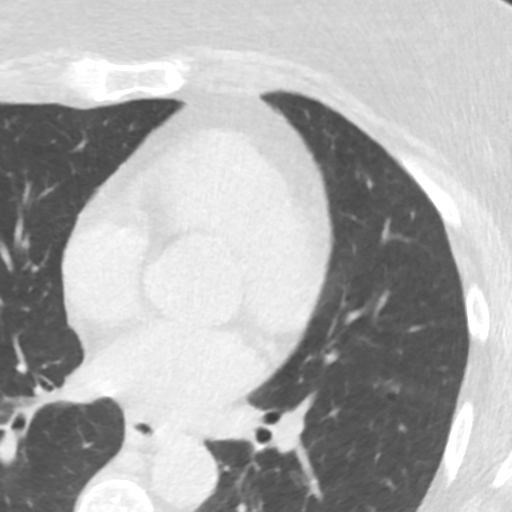
[im 29/43  vessel]
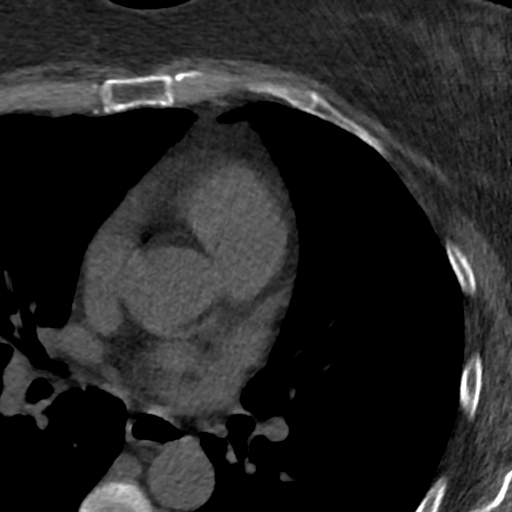
[im 33/43  vessel]
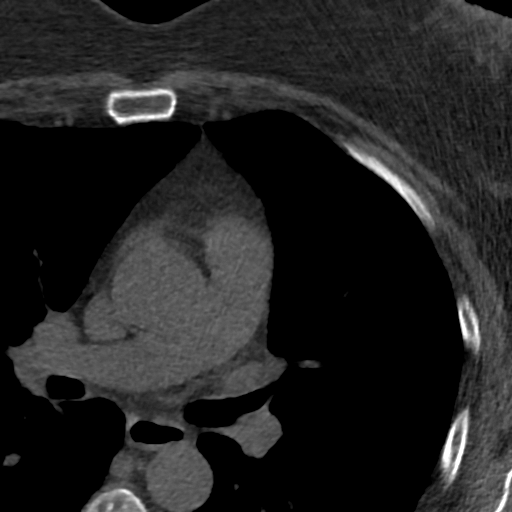
[im 38/43  vessel]
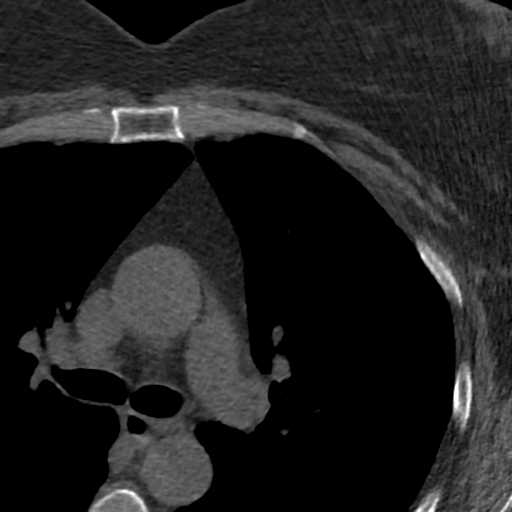

[Series 4: lung st 65 % · axial · 0.68mm/px · z∈[-249,-150]mm · 8 of 43 slices shown]
[im 5/43  lung]
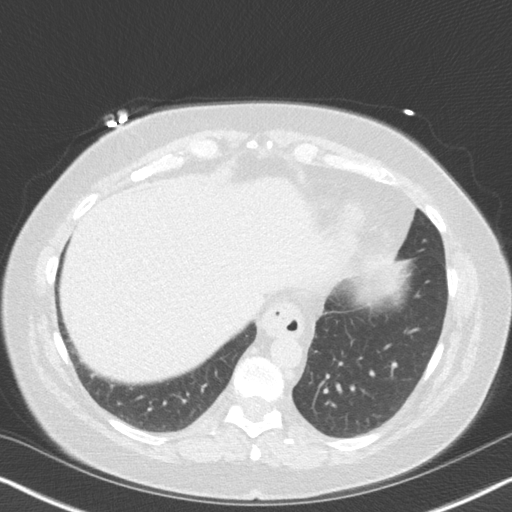
[im 10/43  lung]
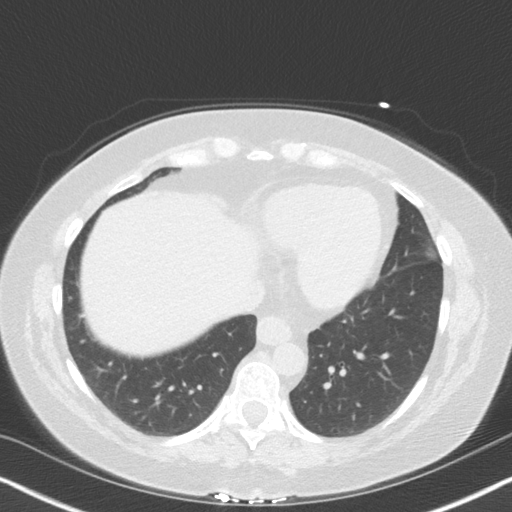
[im 15/43  lung]
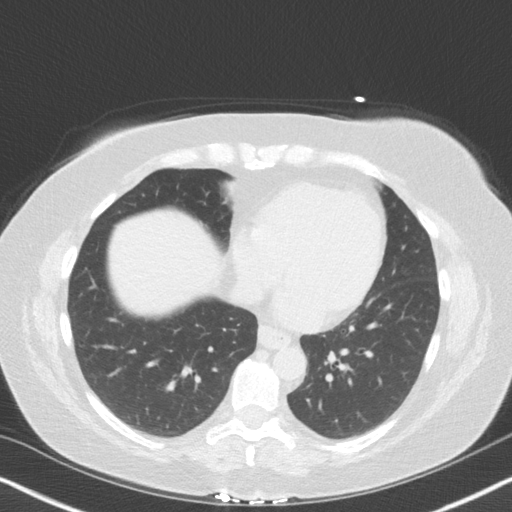
[im 19/43  lung]
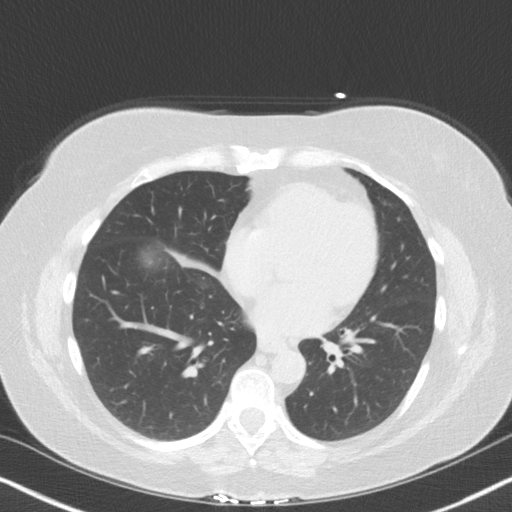
[im 24/43  lung]
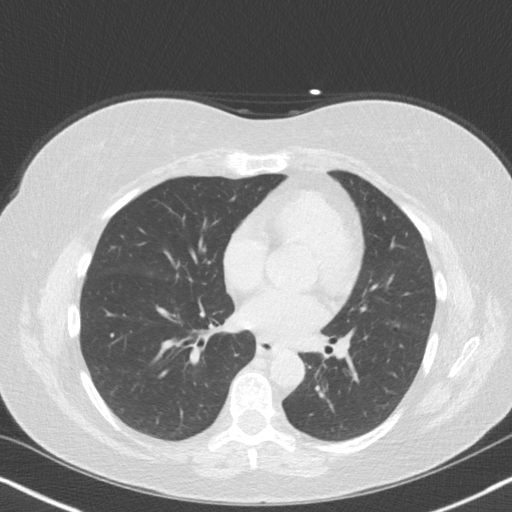
[im 29/43  lung]
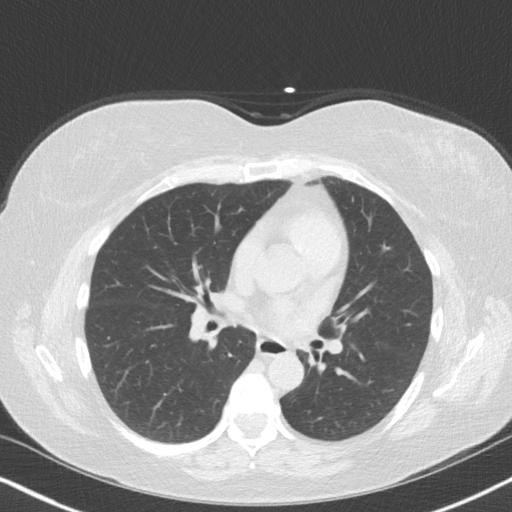
[im 33/43  lung]
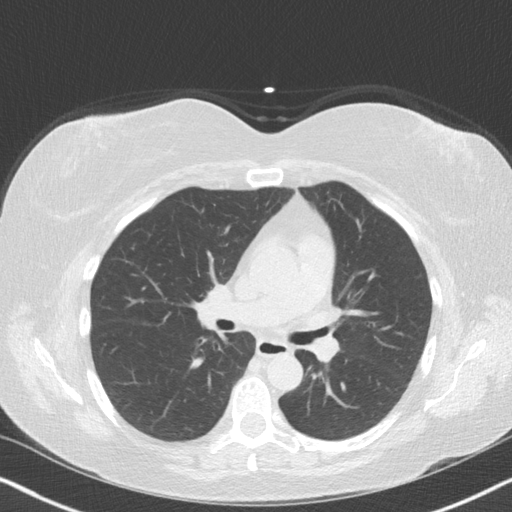
[im 38/43  lung]
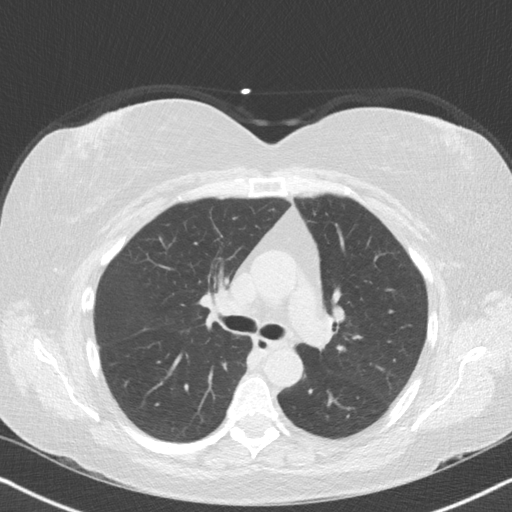

[16 of 20 positions shown; findings below may reference images not displayed]

FINDINGS: Vascular: No incidental findings. Visualized segments of the
thoracic aorta appear of normal caliber. The heart size is normal.
No pericardial fluid identified.

Mediastinum/Nodes: No lymphadenopathy identified in the visualized
chest. There are some tiny nonenlarged mediastinal lymph nodes.

Lungs/Pleura: Visualized lungs show no evidence of pulmonary edema,
consolidation, pneumothorax, nodule or pleural fluid.

Upper Abdomen: Small hiatal hernia.

Musculoskeletal: No chest wall mass or suspicious bone lesions
identified.
IMPRESSION: No significant incidental findings.  Small hiatal hernia.
FINDINGS: Non-cardiac: See separate report from [REDACTED].

Ascending aorta: Normal diameter 3.2 cm

Pericardium: Normal

Coronary arteries: Difficult to tell if calcium seen in right AV
groove is located in mid RCA or RA. Suggest echo to assess RA If
calcium in RCA score calculated below
IMPRESSION: Coronary calcium score of 83. This was 79 th percentile for age and
sex matched control.

Ruddy Fab

*** End of Addendum ***

## 2019-10-13 ENCOUNTER — Ambulatory Visit: Payer: Medicare Other | Admitting: General Surgery

## 2019-10-15 ENCOUNTER — Ambulatory Visit: Payer: Medicare Other | Admitting: General Surgery

## 2019-10-19 ENCOUNTER — Other Ambulatory Visit: Payer: Self-pay

## 2019-10-20 ENCOUNTER — Encounter: Payer: Self-pay | Admitting: Family Medicine

## 2019-10-20 ENCOUNTER — Ambulatory Visit (INDEPENDENT_AMBULATORY_CARE_PROVIDER_SITE_OTHER): Payer: Medicare Other | Admitting: Family Medicine

## 2019-10-20 VITALS — BP 110/80 | HR 76 | Temp 96.4°F | Ht 64.0 in | Wt 190.0 lb

## 2019-10-20 DIAGNOSIS — R1011 Right upper quadrant pain: Secondary | ICD-10-CM

## 2019-10-20 DIAGNOSIS — F331 Major depressive disorder, recurrent, moderate: Secondary | ICD-10-CM | POA: Diagnosis not present

## 2019-10-20 DIAGNOSIS — H9201 Otalgia, right ear: Secondary | ICD-10-CM | POA: Insufficient documentation

## 2019-10-20 DIAGNOSIS — Z720 Tobacco use: Secondary | ICD-10-CM | POA: Insufficient documentation

## 2019-10-20 NOTE — Assessment & Plan Note (Signed)
Refer to ENT

## 2019-10-20 NOTE — Progress Notes (Signed)
Tommi Rumps, MD Phone: [phone]  Regina Nash is a 69 y.o. female who presents today for same-day visit.  Right ear pain: Patient notes this started 2 weeks ago.  Started after she stuck a Q-tip into her ear.  It was a sharp pain.  She notes she did not stick in that far.  No drainage.  No hearing loss.  She has chronic tinnitus.  No bleeding from her ear.  No fevers.  She does note a muffled sensation and did note some eustachian tube fullness prior to this.  She does have chronic allergy issues and cannot tolerate antihistamines.  Depression: Patient notes this is a chronic lifelong issue.  She is interested in seeing a therapist though has not found anybody that takes Medicare.  She is not interested in medications.  No SI.  Right upper quadrant pain, fatty liver: Patient has cut down on beer intake to 0-0 0.5 beers per day.  She has an appointment with surgery later this week to consult on possible removal of her gallbladder.  Social History   Tobacco Use  Smoking Status Current Every Day Smoker  . Packs/day: 0.50  . Types: Cigarettes  Smokeless Tobacco Never Used     ROS see history of present illness  Objective  Physical Exam Vitals:   10/20/19 1051  BP: 110/80  Pulse: 76  Temp: (!) 96.4 F (35.8 C)  SpO2: 98%    BP Readings from Last 3 Encounters:  10/20/19 110/80  03/18/19 127/83  03/06/19 130/88   Wt Readings from Last 3 Encounters:  10/20/19 190 lb (86.2 kg)  07/14/19 185 lb (83.9 kg)  06/17/19 185 lb (83.9 kg)    Physical Exam Constitutional:      General: She is not in acute distress.    Appearance: She is not diaphoretic.  HENT:     Ears:     Comments: Left TM normal, right TM partially obscured by cerumen, the portion of the right TM that is observed appears normal, no ear canal abnormalities noted on either side Cardiovascular:     Rate and Rhythm: Normal rate and regular rhythm.     Heart sounds: Normal heart sounds.  Pulmonary:      Effort: Pulmonary effort is normal.     Breath sounds: Normal breath sounds.  Skin:    General: Skin is warm and dry.  Neurological:     Mental Status: She is alert.      Assessment/Plan: Please see individual problem list.  Depression Refer for therapy.  Right ear pain Refer to ENT.  Tobacco abuse Patient has cut down on smoking.  I encouraged her to quit.  She does not want any assistance in this.  Right upper quadrant pain She will see general surgery as planned later this week.   Orders Placed This Encounter  Procedures  . Ambulatory referral to ENT    Referral Priority:   Routine    Referral Type:   Consultation    Referral Reason:   Specialty Services Required    Requested Specialty:   Otolaryngology    Number of Visits Requested:   1  . Ambulatory referral to Psychology    Referral Priority:   Routine    Referral Type:   Psychiatric    Referral Reason:   Specialty Services Required    Requested Specialty:   Psychology    Number of Visits Requested:   1    No orders of the defined types were placed in this  encounter.   This visit occurred during the SARS-CoV-2 public health emergency.  Safety protocols were in place, including screening questions prior to the visit, additional usage of staff PPE, and extensive cleaning of exam room while observing appropriate contact time as indicated for disinfecting solutions.    Tommi Rumps, MD [place]

## 2019-10-20 NOTE — Assessment & Plan Note (Signed)
Refer  for therapy 

## 2019-10-20 NOTE — Patient Instructions (Signed)
Nice to see you. We will get you scheduled with your nose and throat pain and a therapist. Congratulations on cutting down on beer intake and smoking.

## 2019-10-20 NOTE — Assessment & Plan Note (Signed)
She will see general surgery as planned later this week.

## 2019-10-20 NOTE — Assessment & Plan Note (Signed)
Patient has cut down on smoking.  I encouraged her to quit.  She does not want any assistance in this.

## 2019-10-22 ENCOUNTER — Encounter: Payer: Self-pay | Admitting: General Surgery

## 2019-10-22 ENCOUNTER — Other Ambulatory Visit: Payer: Self-pay

## 2019-10-22 ENCOUNTER — Ambulatory Visit (INDEPENDENT_AMBULATORY_CARE_PROVIDER_SITE_OTHER): Payer: Medicare Other | Admitting: General Surgery

## 2019-10-22 VITALS — BP 132/84 | HR 91 | Temp 95.7°F | Ht 64.0 in | Wt 188.6 lb

## 2019-10-22 DIAGNOSIS — K8020 Calculus of gallbladder without cholecystitis without obstruction: Secondary | ICD-10-CM | POA: Diagnosis not present

## 2019-10-22 NOTE — Patient Instructions (Addendum)
Dr.Cannon discussed with patient the surgical option at today's visit. Patient will discuss with family and give our office a call back if she decides she wants to move forward with surgical treatment.   Cholelithiasis  Cholelithiasis is also called "gallstones." It is a kind of gallbladder disease. The gallbladder is an organ that stores a liquid (bile) that helps you digest fat. Gallstones may not cause symptoms (may be silent gallstones) until they cause a blockage, and then they can cause pain (gallbladder attack). Follow these instructions at home:  Take over-the-counter and prescription medicines only as told by your doctor.  Stay at a healthy weight.  Eat healthy foods. This includes: ? Eating fewer fatty foods, like fried foods. ? Eating fewer refined carbs (refined carbohydrates). Refined carbs are breads and grains that are highly processed, like white bread and white rice. Instead, choose whole grains like whole-wheat bread and brown rice. ? Eating more fiber. Almonds, fresh fruit, and beans are healthy sources of fiber.  Keep all follow-up visits as told by your doctor. This is important. Contact a doctor if:  You have sudden pain in the upper right side of your belly (abdomen). Pain might spread to your right shoulder or your chest. This may be a sign of a gallbladder attack.  You feel sick to your stomach (are nauseous).  You throw up (vomit).  You have been diagnosed with gallstones that have no symptoms and you get: ? Belly pain. ? Discomfort, burning, or fullness in the upper part of your belly (indigestion). Get help right away if:  You have sudden pain in the upper right side of your belly, and it lasts for more than 2 hours.  You have belly pain that lasts for more than 5 hours.  You have a fever or chills.  You keep feeling sick to your stomach or you keep throwing up.  Your skin or the whites of your eyes turn yellow (jaundice).  You have dark-colored pee  (urine).  You have light-colored poop (stool). Summary  Cholelithiasis is also called "gallstones."  The gallbladder is an organ that stores a liquid (bile) that helps you digest fat.  Silent gallstones are gallstones that do not cause symptoms.  A gallbladder attack may cause sudden pain in the upper right side of your belly. Pain might spread to your right shoulder or your chest. If this happens, contact your doctor.  If you have sudden pain in the upper right side of your belly that lasts for more than 2 hours, get help right away. This information is not intended to replace advice given to you by your health care provider. Make sure you discuss any questions you have with your health care provider. Document Revised: 06/14/2017 Document Reviewed: 03/18/2016 Elsevier Patient Education  2020 [employer].

## 2019-10-22 NOTE — Progress Notes (Signed)
Patient ID: Regina Nash, female   DOB: 08/07/1950, 69 y.o.   MRN: [id number]  Chief Complaint  Patient presents with  . New Patient (Initial Visit)    new pt ref Dr.Sonnenberg gallbladder RUQ Pain;    HPI Regina Nash is a 69 y.o. female.   She is here today for further evaluation of cholelithiasis.  She states that for a number of years, she has had off and on abdominal pain, sometimes in the right upper quadrant.  She does have irritable bowel syndrome and thinks that this may also be part of her discomfort.  In November 2020, she reports having become quite ill and had worsening right upper quadrant pain that radiated to her back, in addition to a number of other symptoms.  She has since recovered but continues to have intermittent right upper quadrant pain.  She says this is worse with fatty foods, such as [place] fries.  She has adjusted her diet to avoid these foods specifically.  She denies ever having had fevers, chills, nausea, or vomiting associated with her pain.  She has never had pancreatitis.  She denies any history of jaundice, acholic stools, or dark, Coca[employer]a-colored urine.  Her primary care provider ordered a right upper quadrant ultrasound that demonstrated cholelithiasis without cholecystitis or choledocholithiasis.  It did demonstrate fatty liver, as well.   Past Medical History:  Diagnosis Date  . Allergy   . Anxiety   . Arrhythmia   . Arthritis   . Asthma   . Chronic bronchitis ([place])   . Chronic kidney disease   . Depression   . Genital warts   . GERD (gastroesophageal reflux disease)   . History of recurrent UTIs   . Hyperglycemia   . Migraine   . Sleep apnea   . Substance abuse ([place])   . Urine incontinence     Past Surgical History:  Procedure Laterality Date  . TUBAL LIGATION      Family History  Problem Relation Age of Onset  . Alcoholism Other        Parent  . Arthritis Other        Parent  . Cancer Other        Breast, ovarian/uterine  .  Breast cancer Paternal Aunt        60's  . Alcohol abuse Mother   . Anxiety disorder Mother   . Alcohol abuse Father   . Anxiety disorder Sister   . Anxiety disorder Sister   . Depression Sister   . Colon cancer Neg Hx     Social History Social History   Tobacco Use  . Smoking status: Current Every Day Smoker    Packs/day: 0.50    Types: Cigarettes  . Smokeless tobacco: Never Used  Substance Use Topics  . Alcohol use: Yes    Alcohol/week: 12.0 standard drinks    Types: 12 Cans of beer per week    Comment: 1.5 beers a day  . Drug use: No    Allergies  Allergen Reactions  . Codeine Other (See Comments)  . Erythromycin Other (See Comments)    abdominal pain    Current Outpatient Medications  Medication Sig Dispense Refill  . calcium carbonate (TUMS - DOSED IN MG ELEMENTAL CALCIUM) 500 MG chewable tablet Chew 1 tablet by mouth daily.     No current facility-administered medications for this visit.    Review of Systems Review of Systems  Constitutional: Positive for fatigue.  Musculoskeletal: Positive for back pain.  Psychiatric/Behavioral:  The patient is nervous/anxious.     Blood pressure 132/84, pulse 91, temperature (!) 95.7 F (35.4 C), temperature source Temporal, height 5\' 4"  (1.626 m), weight 188 lb 9.6 oz (85.5 kg), last menstrual period 02/16/2010, SpO2 97 %. Body mass index is 32.37 kg/m.  Physical Exam Physical Exam Constitutional:      General: She is not in acute distress.    Appearance: Normal appearance. She is obese.  HENT:     Head: Normocephalic and atraumatic.     Nose:     Comments: Covered with a mask secondary to COVID-19 precautions    Mouth/Throat:     Comments: Covered with a mask secondary to COVID-19 precautions Eyes:     General: No scleral icterus.       Right eye: No discharge.        Left eye: No discharge.  Neck:     Comments: No palpable thyromegaly or dominant thyroid masses appreciated.  The gland moves freely with  deglutition. Cardiovascular:     Rate and Rhythm: Normal rate and regular rhythm.     Pulses: Normal pulses.  Pulmonary:     Effort: Pulmonary effort is normal. No respiratory distress.     Breath sounds: Normal breath sounds.  Abdominal:     General: Bowel sounds are normal.     Palpations: Abdomen is soft.     Tenderness: There is no abdominal tenderness. There is no guarding.     Comments: Protuberant, consistent with her level of obesity.  No tenderness on exam today; Percell Miller sign is negative.  Genitourinary:    Comments: Deferred Musculoskeletal:        General: No swelling or tenderness.     Cervical back: No rigidity.  Lymphadenopathy:     Cervical: No cervical adenopathy.  Skin:    General: Skin is warm and dry.     Comments: She has significant scaly dry skin on the bilateral lower extremities.  Neurological:     General: No focal deficit present.     Mental Status: She is alert and oriented to person, place, and time.  Psychiatric:        Mood and Affect: Mood normal.        Behavior: Behavior normal.     Data Reviewed Results for Regina, Nash" (MRN [id number]) as of 10/22/2019 15:24  Ref. Range 07/21/2019 16:21 09/18/2019 14:11 09/23/2019 16:34  Sodium Latest Ref Range: 135 - 145 mmol/L 138    Potassium Latest Ref Range: 3.5 - 5.1 mmol/L 4.0    Chloride Latest Ref Range: 98 - 111 mmol/L 101    CO2 Latest Ref Range: 22 - 32 mmol/L 28    Glucose Latest Ref Range: 70 - 99 mg/dL 104 (H)    BUN Latest Ref Range: 8 - 23 mg/dL 11    Creatinine Latest Ref Range: 0.44 - 1.00 mg/dL 0.89    Calcium Latest Ref Range: 8.9 - 10.3 mg/dL 9.7    Anion gap Latest Ref Range: 5 - 15  9    Alkaline Phosphatase Latest Ref Range: 38 - 126 U/L 70    Albumin Latest Ref Range: 3.5 - 5.0 g/dL 3.9    Lipase Latest Ref Range: 11 - 51 U/L   51  AST Latest Ref Range: 15 - 41 U/L 32    ALT Latest Ref Range: 0 - 44 U/L 44    Total Protein Latest Ref Range: 6.5 - 8.1 g/dL 7.2    Total  Bilirubin  Latest Ref Range: 0.3 - 1.2 mg/dL 0.6    GFR, Est Non African American Latest Ref Range: >60 mL/min >60    GFR, Est African American Latest Ref Range: >60 mL/min >60    WBC Latest Ref Range: 4.0 - 10.5 K/uL 7.1    RBC Latest Ref Range: 3.87 - 5.11 MIL/uL 4.68    Hemoglobin Latest Ref Range: 12.0 - 15.0 g/dL 14.4    HCT Latest Ref Range: 36.0 - 46.0 % 44.0    MCV Latest Ref Range: 80.0 - 100.0 fL 94.0    MCH Latest Ref Range: 26.0 - 34.0 pg 30.8    MCHC Latest Ref Range: 30.0 - 36.0 g/dL 32.7    RDW Latest Ref Range: 11.5 - 15.5 % 12.2    Platelets Latest Ref Range: 150 - 400 K/uL 207    nRBC Latest Ref Range: 0.0 - 0.2 % 0.0     These labs were obtained by her primary care provider.  The white blood cell count is normal indicating no current infection or significant inflammation.  Transaminases are also within normal range, as is her bilirubin.  Lipase is within normal limits as well.  A right upper quadrant ultrasound was performed on September 29, 2019.  I have reviewed the images and concur with the radiologist findings which are copied here: CLINICAL DATA:  69 year old female with right upper quadrant abdominal pain.  EXAM: ULTRASOUND ABDOMEN LIMITED RIGHT UPPER QUADRANT  COMPARISON:  Right upper quadrant ultrasound dated 09/18/2019.  FINDINGS: Gallbladder:  There multiple stones in the gallbladder. No gallbladder wall thickening or pericholecystic fluid. Negative sonographic Murphy's sign.  Common bile duct:  Diameter: 2 mm  Liver:  There is diffuse increased liver echogenicity most commonly seen in the setting of fatty infiltration. Superimposed inflammation or fibrosis is not excluded. Clinical correlation is recommended. Portal vein is patent on color Doppler imaging with normal direction of blood flow towards the liver.  Other: None.  IMPRESSION: 1. Cholelithiasis without sonographic evidence of acute cholecystitis. 2. Fatty  liver.  Assessment This is a 69 year old woman who has a number of medical comorbidities including irritable bowel syndrome.  She has been experiencing right upper quadrant pain that does seem to be associated more strongly with fatty food intake.  She has cholelithiasis on right upper quadrant ultrasound.  She is extremely nervous about the possibility of surgery.  Plan I discussed the procedure in detail.  We discussed the risks and benefits of a laparoscopic cholecystectomy and possible cholangiogram including, but not limited to: bleeding, infection, injury to surrounding structures such as the intestine or liver, bile leak, retained gallstones, need to convert to an open procedure, prolonged diarrhea, blood clots such as DVT, common bile duct injury, anesthesia risks, and possible need for additional procedures.   I also discussed the nature of cholelithiasis, specifically that many people have stones and never go on to have symptoms.  I explained the function of the gallbladder and bile in relationship to digestion.  I also discussed some of the potential warning signs she should look for that might necessitate a more urgent intervention.  She is not certain whether or not she would like to proceed with surgery at this time.  She will think about it and contact us with her decision.  I have not made a scheduled follow-up visit but we will certainly accommodate her, should she desire operative intervention.   Fredirick Maudlin 10/22/2019, 3:15 PM

## 2019-11-02 NOTE — Progress Notes (Signed)
Cardiology Office Note  Date:  11/03/2019   ID:  Regina Nash, Regina Nash Jun 28, 1951, MRN [id number]  PCP:  Leone Haven, MD   Chief Complaint  Patient presents with  . other    Follow up paroxysmal tachycardia and discuss CT cardiac scoring from 2019. Recently diagnose with sleep apnea; has a CPAP machine but doesn't use it. eds reviewed by the pt. verbally. Pt. c/o strong heart beats at times and more so with over exertion.     HPI:  Regina Nash is a 69 year old woman with past medical history of Smoking Hyperlipidemia osa not on CPAP Depression/PTSD Who presents for routine follow-up of her palpitations, T coronary calcium scoring  Cardiac CT score results discussed with her Coronary calcium score of 83. This was 33 th percentile for age and sex matched control.  She has cut down her smoking was previously smoking 1-1/2 packs a day now Smoking 1/2 ppd Beer 0-1 /day  stress in her life, some marital issues Reports having trouble with PTSD Trying to get into see a counselor, reports she has called 10 people no one is called her back .  These were the numbers provided to her by primary care Thinks it is because she has Medicare  No regular exercise program  Lab work reviewed Hemoglobin A1c 5.8 Total cholesterol 222, LDL 142 Numbers reviewed with her again, she is not on a statin  EKG personally reviewed by myself on todays visit Shows normal sinus rhythm with rate 78 bpm   PMH:   has a past medical history of Allergy, Anxiety, Arrhythmia, Arthritis, Asthma, Chronic bronchitis ([place]), Chronic kidney disease, Depression, Fatty liver, Gallstones, Genital warts, GERD (gastroesophageal reflux disease), History of recurrent UTIs, Hyperglycemia, IBS (irritable bowel syndrome), Migraine, PTSD (post-traumatic stress disorder), Sleep apnea, Substance abuse ([place]), and Urine incontinence.  PSH:    Past Surgical History:  Procedure Laterality Date  . TUBAL LIGATION       Current Outpatient Medications  Medication Sig Dispense Refill  . calcium carbonate (TUMS - DOSED IN MG ELEMENTAL CALCIUM) 500 MG chewable tablet Chew 1 tablet by mouth daily.     No current facility-administered medications for this visit.    Allergies:   Codeine and Erythromycin   Social History:  The patient  reports that she has been smoking cigarettes. She has been smoking about 0.50 packs per day. She has never used smokeless tobacco. She reports current alcohol use of about 12.0 standard drinks of alcohol per week. She reports that she does not use drugs.   Family History:   family history includes Alcohol abuse in her father and mother; Alcoholism in an other family member; Anxiety disorder in her mother, sister, and sister; Arthritis in an other family member; Breast cancer in her paternal aunt; Cancer in an other family member; Depression in her sister.    Review of Systems: Review of Systems  Constitutional: Negative.   Respiratory: Negative.   Cardiovascular: Positive for palpitations.  Gastrointestinal: Negative.   Musculoskeletal: Negative.   Neurological: Negative.   Psychiatric/Behavioral: Negative.   All other systems reviewed and are negative.   PHYSICAL EXAM: VS:  BP 130/88 (BP Location: Left Arm, Patient Position: Sitting, Cuff Size: Normal)   Pulse 78   Ht 5\' 4"  (1.626 m)   Wt 192 lb (87.1 kg)   LMP 02/16/2010 (Exact Date)   SpO2 98%   BMI 32.96 kg/m  , BMI Body mass index is 32.96 kg/m. Constitutional:  oriented to person, place,  and time. No distress.  HENT:  Head: Grossly normal Eyes:  no discharge. No scleral icterus.  Neck: No JVD, no carotid bruits  Cardiovascular: Regular rate and rhythm, no murmurs appreciated Pulmonary/Chest: Clear to auscultation bilaterally, no wheezes or rails Abdominal: Soft.  no distension.  no tenderness.  Musculoskeletal: Normal range of motion Neurological:  normal muscle tone. Coordination normal. No  atrophy Skin: Skin warm and dry Psychiatric: normal affect, pleasant  Recent Labs: 07/21/2019: ALT 44; BUN 11; Creatinine, Ser 0.89; Hemoglobin 14.4; Platelets 207; Potassium 4.0; Sodium 138    Lipid Panel Lab Results  Component Value Date   CHOL 222 (H) 12/12/2017   HDL 40.40 12/12/2017   LDLCALC 142 (H) 12/12/2017   TRIG 198.0 (H) 12/12/2017      Wt Readings from Last 3 Encounters:  11/03/19 192 lb (87.1 kg)  10/22/19 188 lb 9.6 oz (85.5 kg)  10/20/19 190 lb (86.2 kg)     ASSESSMENT AND PLAN:  Paroxysmal tachycardia ([place]) -   exacerbated by anxiety, stress at home She wore a monitor in October 2019, minimal arrhythmia  Palpitations -  Likely APCs PVCs No further work-up at this time  Mixed hyperlipidemia Smoker, high cholesterol Calcium score 80 Recommend she work on lifestyle modification, she does not want a statin  CKD (chronic kidney disease) stage 3, GFR 30-59 ml/min (HCC) Recommend hydration  Sleep apnea, unspecified type Managed by primary care Recommended weight loss Walking program  Abnormal electrocardiogram - Plan: CT CARDIAC SCORING Minimal coronary calcification No further testing needed, no anginal symptoms  Disposition:   F/U as needed   Total encounter time more than 25 minutes  Greater than 50% was spent in counseling and coordination of care with the patient   Orders Placed This Encounter  Procedures  . EKG 12-Lead     Signed, Esmond Plants, M.D., Ph.D. 11/03/2019  [place], [place]

## 2019-11-03 ENCOUNTER — Other Ambulatory Visit: Payer: Self-pay

## 2019-11-03 ENCOUNTER — Ambulatory Visit (INDEPENDENT_AMBULATORY_CARE_PROVIDER_SITE_OTHER): Payer: Medicare Other | Admitting: Cardiovascular Disease

## 2019-11-03 ENCOUNTER — Encounter: Payer: Self-pay | Admitting: Cardiovascular Disease

## 2019-11-03 ENCOUNTER — Encounter: Payer: Self-pay | Admitting: *Deleted

## 2019-11-03 VITALS — BP 130/88 | HR 78 | Ht 64.0 in | Wt 192.0 lb

## 2019-11-03 DIAGNOSIS — E782 Mixed hyperlipidemia: Secondary | ICD-10-CM | POA: Diagnosis not present

## 2019-11-03 DIAGNOSIS — N183 Chronic kidney disease, stage 3 unspecified: Secondary | ICD-10-CM

## 2019-11-03 DIAGNOSIS — I479 Paroxysmal tachycardia, unspecified: Secondary | ICD-10-CM

## 2019-11-03 DIAGNOSIS — R002 Palpitations: Secondary | ICD-10-CM

## 2019-11-03 NOTE — Patient Instructions (Addendum)
Dr. Cephus Shelling Adult Psychiatry [phone]   [facility] [phone]   Medication Instructions:  No changes  If you need a refill on your cardiac medications before your next appointment, please call your pharmacy.    Lab work: No new labs needed   If you have labs (blood work) drawn today and your tests are completely normal, you will receive your results only by: Marland Kitchen MyChart Message (if you have MyChart) OR . A paper copy in the mail If you have any lab test that is abnormal or we need to change your treatment, we will call you to review the results.   Testing/Procedures: No new testing needed   Follow-Up: At [facility], you and your health needs are our priority.  As part of our continuing mission to provide you with exceptional heart care, we have created designated Provider Care Teams.  These Care Teams include your primary Cardiologist (physician) and Advanced Practice Providers (APPs -  Physician Assistants and Nurse Practitioners) who all work together to provide you with the care you need, when you need it.  . You will need a follow up appointment in 12 months .   Please call our office 2 months in advance to schedule this appointment.    . Providers on your designated Care Team:   . Murray Hodgkins, NP . Christell Faith, PA-C . Marrianne Mood, PA-C  Any Other Special Instructions Will Be Listed Below (If Applicable).  For educational health videos Log in to : [email or web address] Or : [email or web address], password : [email or web address]

## 2019-11-04 DIAGNOSIS — H6983 Other specified disorders of Eustachian tube, bilateral: Secondary | ICD-10-CM | POA: Diagnosis not present

## 2019-11-04 DIAGNOSIS — J301 Allergic rhinitis due to pollen: Secondary | ICD-10-CM | POA: Diagnosis not present

## 2019-11-04 DIAGNOSIS — H6123 Impacted cerumen, bilateral: Secondary | ICD-10-CM | POA: Diagnosis not present

## 2019-11-23 ENCOUNTER — Encounter: Payer: Self-pay | Admitting: *Deleted

## 2019-11-23 ENCOUNTER — Ambulatory Visit: Payer: Medicare Other | Admitting: Gastroenterology

## 2019-11-26 ENCOUNTER — Ambulatory Visit: Payer: Medicare Other | Admitting: Gastroenterology

## 2019-12-16 ENCOUNTER — Ambulatory Visit (INDEPENDENT_AMBULATORY_CARE_PROVIDER_SITE_OTHER): Payer: Medicare Other | Admitting: Psychology

## 2019-12-16 DIAGNOSIS — F332 Major depressive disorder, recurrent severe without psychotic features: Secondary | ICD-10-CM | POA: Diagnosis not present

## 2020-01-08 ENCOUNTER — Ambulatory Visit (INDEPENDENT_AMBULATORY_CARE_PROVIDER_SITE_OTHER): Payer: Medicare Other | Admitting: Psychology

## 2020-01-08 DIAGNOSIS — F332 Major depressive disorder, recurrent severe without psychotic features: Secondary | ICD-10-CM

## 2020-01-19 ENCOUNTER — Ambulatory Visit (INDEPENDENT_AMBULATORY_CARE_PROVIDER_SITE_OTHER): Payer: Medicare Other | Admitting: Gastroenterology

## 2020-01-19 ENCOUNTER — Other Ambulatory Visit: Payer: Self-pay

## 2020-01-19 VITALS — BP 121/82 | HR 80 | Temp 97.5°F | Ht 64.0 in | Wt 190.0 lb

## 2020-01-19 DIAGNOSIS — R195 Other fecal abnormalities: Secondary | ICD-10-CM | POA: Diagnosis not present

## 2020-01-19 DIAGNOSIS — K760 Fatty (change of) liver, not elsewhere classified: Secondary | ICD-10-CM | POA: Diagnosis not present

## 2020-01-19 NOTE — Patient Instructions (Signed)
Fatty Liver Disease  Fatty liver disease occurs when too much fat has built up in your liver cells. Fatty liver disease is also called hepatic steatosis or steatohepatitis. The liver removes harmful substances from your bloodstream and produces fluids that your body needs. It also helps your body use and store energy from the food you eat. In many cases, fatty liver disease does not cause symptoms or problems. It is often diagnosed when tests are being done for other reasons. However, over time, fatty liver can cause inflammation that may lead to more serious liver problems, such as scarring of the liver (cirrhosis) and liver failure. Fatty liver is associated with insulin resistance, increased body fat, high blood pressure (hypertension), and high cholesterol. These are features of metabolic syndrome and increase your risk for stroke, diabetes, and heart disease. What are the causes? This condition may be caused by:  Drinking too much alcohol.  Poor nutrition.  Obesity.  Cushing's syndrome.  Diabetes.  High cholesterol.  Certain drugs.  Poisons.  Some viral infections.  Pregnancy. What increases the risk? You are more likely to develop this condition if you:  Abuse alcohol.  Are overweight.  Have diabetes.  Have hepatitis.  Have a high triglyceride level.  Are pregnant. What are the signs or symptoms? Fatty liver disease often does not cause symptoms. If symptoms do develop, they can include:  Fatigue.  Weakness.  Weight loss.  Confusion.  Abdominal pain.  Nausea and vomiting.  Yellowing of your skin and the white parts of your eyes (jaundice).  Itchy skin. How is this diagnosed? This condition may be diagnosed by:  A physical exam and medical history.  Blood tests.  Imaging tests, such as an ultrasound, CT scan, or MRI.  A liver biopsy. A small sample of liver tissue is removed using a needle. The sample is then looked at under a microscope. How  is this treated? Fatty liver disease is often caused by other health conditions. Treatment for fatty liver may involve medicines and lifestyle changes to manage conditions such as:  Alcoholism.  High cholesterol.  Diabetes.  Being overweight or obese. Follow these instructions at home:   Do not drink alcohol. If you have trouble quitting, ask your health care provider how to safely quit with the help of medicine or a supervised program. This is important to keep your condition from getting worse.  Eat a healthy diet as told by your health care provider. Ask your health care provider about working with a diet and nutrition specialist (dietitian) to develop an eating plan.  Exercise regularly. This can help you lose weight and control your cholesterol and diabetes. Talk to your health care provider about an exercise plan and which activities are best for you.  Take over-the-counter and prescription medicines only as told by your health care provider.  Keep all follow-up visits as told by your health care provider. This is important. Contact a health care provider if: You have trouble controlling your:  Blood sugar. This is especially important if you have diabetes.  Cholesterol.  Drinking of alcohol. Get help right away if:  You have abdominal pain.  You have jaundice.  You have nausea and vomiting.  You vomit blood or material that looks like coffee grounds.  You have stools that are black, tar-like, or bloody. Summary  Fatty liver disease develops when too much fat builds up in the cells of your liver.  Fatty liver disease often causes no symptoms or problems. However, over   time, fatty liver can cause inflammation that may lead to more serious liver problems, such as scarring of the liver (cirrhosis).  You are more likely to develop this condition if you abuse alcohol, are pregnant, are overweight, have diabetes, have hepatitis, or have high triglyceride  levels.  Contact your health care provider if you have trouble controlling your weight, blood sugar, cholesterol, or drinking of alcohol. This information is not intended to replace advice given to you by your health care provider. Make sure you discuss any questions you have with your health care provider. Document Revised: 06/14/2017 Document Reviewed: 04/10/2017 Elsevier Patient Education  2020 [employer]  

## 2020-01-19 NOTE — Addendum Note (Signed)
Addended by: Dorethea Clan on: 01/19/2020 02:31 PM   Modules accepted: Orders

## 2020-01-19 NOTE — Progress Notes (Signed)
Jonathon Bellows MD, MRCP(U.K) [address]  [place], [place] [address]  Main: [phone]  Fax: [phone]   Primary Care Physician: Leone Haven, MD  Primary Gastroenterologist:  Dr. Jonathon Bellows    Referred to see me for fatty liver  HPI: Regina Nash is a 69 y.o. female     Summary of history :  Last seen in my office in 04/04/2018 for epigastric pain.  Previously had a positive Cologuard test but she was not interested in a colonoscopy.  Interval history 04/04/2018-01/19/2020  She had some right upper quadrant pain and was referred to Dr. Celine Ahr in surgery.  Ultrasound has shown cholelithiasis.  Surgery has been discussed with the patient.  CMP in January 2021 showed normal transaminases.  Ultrasound in March 2021 demonstrated fatty liver.  No excess alcohol.  She wishes to lose weight.  Is trying.  Current Outpatient Medications  Medication Sig Dispense Refill   calcium carbonate (TUMS - DOSED IN MG ELEMENTAL CALCIUM) 500 MG chewable tablet Chew 1 tablet by mouth daily.     No current facility-administered medications for this visit.    Allergies as of 01/19/2020 - Review Complete 11/03/2019  Allergen Reaction Noted   Codeine Other (See Comments) 06/21/2016   Erythromycin Other (See Comments) 06/21/2016    ROS:  General: Negative for anorexia, weight loss, fever, chills, fatigue, weakness. ENT: Negative for hoarseness, difficulty swallowing , nasal congestion. CV: Negative for chest pain, angina, palpitations, dyspnea on exertion, peripheral edema.  Respiratory: Negative for dyspnea at rest, dyspnea on exertion, cough, sputum, wheezing.  GI: See history of present illness. GU:  Negative for dysuria, hematuria, urinary incontinence, urinary frequency, nocturnal urination.  Endo: Negative for unusual weight change.    Physical Examination:   LMP 02/16/2010 (Exact Date)   General: Well-nourished, well-developed in no acute distress.  Eyes:  No icterus. Conjunctivae pink. Mouth: Oropharyngeal mucosa moist and pink , no lesions erythema or exudate. Lungs: Clear to auscultation bilaterally. Non-labored. Heart: Regular rate and rhythm, no murmurs rubs or gallops.  Abdomen: Bowel sounds are normal, nontender, nondistended, no hepatosplenomegaly or masses, no abdominal bruits or hernia , no rebound or guarding.   Extremities: No lower extremity edema. No clubbing or deformities. Neuro: Alert and oriented x 3.  Grossly intact. Skin: Warm and dry, no jaundice.   Psych: Alert and cooperative, normal mood and affect.   Imaging Studies: No results found.  Assessment and Plan:   Regina Nash is a 69 y.o. y/o female referred back to see me for fatty liver.  Incidentally seen on right upper quadrant ultrasound which is being performed to evaluate for upper quadrant pain.  She has been seen by Dr. Celine Ahr in surgery and surgery has been discussed.  I have been asked to see her for fatty liver disease.  Transaminases have been normal.  Plan 1.  Suggest weight loss, exercise and healthy eating.  Given patient information on Mediterranean diet which is good for the level.  Would suggest to have her immunity to was hepatitis A and B checked if negative will need vaccination.  She is also keen to receive a referral to a dietitian which I will do  2.  Reiterated importance of her colonoscopy in view of her positive Cologuard test that she had in 2019.  I would strongly suggest she proceed with a colonoscopy.  She would like to think about it is that she suffers from PTSD and is aware that she can call  my office anytime to schedule it at her convenience.    Dr Jonathon Bellows  MD,MRCP [facility]) Follow up in as needed

## 2020-01-20 LAB — HEPATITIS A ANTIBODY, TOTAL: hep A Total Ab: NEGATIVE

## 2020-01-20 LAB — HEPATITIS B SURFACE ANTIBODY,QUALITATIVE: Hep B Surface Ab, Qual: NONREACTIVE

## 2020-01-21 ENCOUNTER — Telehealth: Payer: Self-pay

## 2020-01-21 NOTE — Telephone Encounter (Signed)
-----   Message from Jonathon Bellows, MD sent at 01/21/2020 11:14 AM EDT ----- Inform not immune to hepatitis A and B needs vaccine

## 2020-01-21 NOTE — Telephone Encounter (Signed)
Called pt to inform her of lab results and Dr. Georgeann Oppenheim recommendation.  Unable to contact, LVM to return call

## 2020-01-23 DIAGNOSIS — R0602 Shortness of breath: Secondary | ICD-10-CM | POA: Diagnosis not present

## 2020-01-23 DIAGNOSIS — G4733 Obstructive sleep apnea (adult) (pediatric): Secondary | ICD-10-CM | POA: Diagnosis not present

## 2020-01-24 DIAGNOSIS — R0602 Shortness of breath: Secondary | ICD-10-CM | POA: Diagnosis not present

## 2020-01-24 DIAGNOSIS — G4733 Obstructive sleep apnea (adult) (pediatric): Secondary | ICD-10-CM | POA: Diagnosis not present

## 2020-01-27 NOTE — Telephone Encounter (Signed)
Called and left a message for call back. Patient read mychart message  

## 2020-01-27 NOTE — Telephone Encounter (Signed)
Patient returning call. Pt states she received lab results on mychart and has decided not to get Hep shot.

## 2020-02-03 ENCOUNTER — Ambulatory Visit: Payer: Medicare Other | Admitting: Psychology

## 2020-02-10 ENCOUNTER — Ambulatory Visit: Payer: Medicare Other | Admitting: Psychology

## 2020-02-17 ENCOUNTER — Ambulatory Visit (INDEPENDENT_AMBULATORY_CARE_PROVIDER_SITE_OTHER): Payer: Medicare Other | Admitting: Psychology

## 2020-02-17 DIAGNOSIS — F332 Major depressive disorder, recurrent severe without psychotic features: Secondary | ICD-10-CM

## 2020-02-24 ENCOUNTER — Ambulatory Visit
Admission: RE | Admit: 2020-02-24 | Discharge: 2020-02-24 | Disposition: A | Discharge status: Home / Self Care | Payer: Medicare Other | Source: Ambulatory Visit | Attending: Internal Medicine | Admitting: Internal Medicine

## 2020-02-24 ENCOUNTER — Other Ambulatory Visit: Payer: Self-pay

## 2020-02-24 DIAGNOSIS — Z1231 Encounter for screening mammogram for malignant neoplasm of breast: Secondary | ICD-10-CM | POA: Diagnosis not present

## 2020-03-09 ENCOUNTER — Ambulatory Visit (INDEPENDENT_AMBULATORY_CARE_PROVIDER_SITE_OTHER): Payer: Medicare Other | Admitting: Psychology

## 2020-03-09 DIAGNOSIS — F332 Major depressive disorder, recurrent severe without psychotic features: Secondary | ICD-10-CM | POA: Diagnosis not present

## 2020-03-14 ENCOUNTER — Ambulatory Visit (INDEPENDENT_AMBULATORY_CARE_PROVIDER_SITE_OTHER): Payer: Medicare Other

## 2020-03-14 VITALS — Ht 64.0 in | Wt 190.0 lb

## 2020-03-14 DIAGNOSIS — Z0000 Encounter for general adult medical examination without abnormal findings: Secondary | ICD-10-CM | POA: Diagnosis not present

## 2020-03-14 NOTE — Patient Instructions (Addendum)
[name] , Thank you for taking time to come for your Medicare Wellness Visit. I appreciate your ongoing commitment to your health goals. Please review the following plan we discussed and let me know if I can assist you in the future.   These are the goals we discussed: Goals    . Increase physical activity     Walk for exercise       This is a list of the screening recommended for you and due dates:  Health Maintenance  Topic Date Due  . Cologuard (Stool DNA test)  11/12/2020  . Mammogram  02/23/2022  . DEXA scan (bone density measurement)  Completed  .  Hepatitis C: One time screening is recommended by Center for Disease Control  (CDC) for  adults born from [birth year] through 1963.   Completed  . Flu Shot  Discontinued  . Tetanus Vaccine  Discontinued  . COVID-19 Vaccine  Discontinued  . Pneumonia vaccines  Discontinued    Immunizations There is no immunization history on file for this patient. Vaccines discontinued per patient preference.   Advanced directives:   Follow up in one year for your annual wellness visit.   Preventive Care 67 Years and Older, Female Preventive care refers to lifestyle choices and visits with your health care provider that can promote health and wellness. What does preventive care include?  A yearly physical exam. This is also called an annual well check.  Dental exams once or twice a year.  Routine eye exams. Ask your health care provider how often you should have your eyes checked.  Personal lifestyle choices, including:  Daily care of your teeth and gums.  Regular physical activity.  Eating a healthy diet.  Avoiding tobacco and drug use.  Limiting alcohol use.  Practicing safe sex.  Taking low-dose aspirin every day.  Taking vitamin and mineral supplements as recommended by your health care provider. What happens during an annual well check? The services and screenings done by your health care provider during your annual well  check will depend on your age, overall health, lifestyle risk factors, and family history of disease. Counseling  Your health care provider may ask you questions about your:  Alcohol use.  Tobacco use.  Drug use.  Emotional well-being.  Home and relationship well-being.  Sexual activity.  Eating habits.  History of falls.  Memory and ability to understand (cognition).  Work and work Statistician.  Reproductive health. Screening  You may have the following tests or measurements:  Height, weight, and BMI.  Blood pressure.  Lipid and cholesterol levels. These may be checked every 5 years, or more frequently if you are over 6 years old.  Skin check.  Lung cancer screening. You may have this screening every year starting at age 67 if you have a 30-pack-year history of smoking and currently smoke or have quit within the past 15 years.  Fecal occult blood test (FOBT) of the stool. You may have this test every year starting at age 57.  Flexible sigmoidoscopy or colonoscopy. You may have a sigmoidoscopy every 5 years or a colonoscopy every 10 years starting at age 67.  Hepatitis C blood test.  Hepatitis B blood test.  Sexually transmitted disease (STD) testing.  Diabetes screening. This is done by checking your blood sugar (glucose) after you have not eaten for a while (fasting). You may have this done every 1-3 years.  Bone density scan. This is done to screen for osteoporosis. You may have this done  starting at age 67.  Mammogram. This may be done every 1-2 years. Talk to your health care provider about how often you should have regular mammograms. Talk with your health care provider about your test results, treatment options, and if necessary, the need for more tests. Vaccines  Your health care provider may recommend certain vaccines, such as:  Influenza vaccine. This is recommended every year.  Tetanus, diphtheria, and acellular pertussis (Tdap, Td) vaccine. You  may need a Td booster every 10 years.  Zoster vaccine. You may need this after age 67.  Pneumococcal 13-valent conjugate (PCV13) vaccine. One dose is recommended after age 67.  Pneumococcal polysaccharide (PPSV23) vaccine. One dose is recommended after age 67. Talk to your health care provider about which screenings and vaccines you need and how often you need them. This information is not intended to replace advice given to you by your health care provider. Make sure you discuss any questions you have with your health care provider. Document Released: 07/29/2015 Document Revised: 03/21/2016 Document Reviewed: 05/03/2015 Elsevier Interactive Patient Education  2017 [employer] Prevention in the Home Falls can cause injuries. They can happen to people of all ages. There are many things you can do to make your home safe and to help prevent falls. What can I do on the outside of my home?  Regularly fix the edges of walkways and driveways and fix any cracks.  Remove anything that might make you trip as you walk through a door, such as a raised step or threshold.  Trim any bushes or trees on the path to your home.  Use bright outdoor lighting.  Clear any walking paths of anything that might make someone trip, such as rocks or tools.  Regularly check to see if handrails are loose or broken. Make sure that both sides of any steps have handrails.  Any raised decks and porches should have guardrails on the edges.  Have any leaves, snow, or ice cleared regularly.  Use sand or salt on walking paths during winter.  Clean up any spills in your garage right away. This includes oil or grease spills. What can I do in the bathroom?  Use night lights.  Install grab bars by the toilet and in the tub and shower. Do not use towel bars as grab bars.  Use non-skid mats or decals in the tub or shower.  If you need to sit down in the shower, use a plastic, non-slip stool.  Keep the floor  dry. Clean up any water that spills on the floor as soon as it happens.  Remove soap buildup in the tub or shower regularly.  Attach bath mats securely with double-sided non-slip rug tape.  Do not have throw rugs and other things on the floor that can make you trip. What can I do in the bedroom?  Use night lights.  Make sure that you have a light by your bed that is easy to reach.  Do not use any sheets or blankets that are too big for your bed. They should not hang down onto the floor.  Have a firm chair that has side arms. You can use this for support while you get dressed.  Do not have throw rugs and other things on the floor that can make you trip. What can I do in the kitchen?  Clean up any spills right away.  Avoid walking on wet floors.  Keep items that you use a lot in easy-to-reach places.  If you need to reach something above you, use a strong step stool that has a grab bar.  Keep electrical cords out of the way.  Do not use floor polish or wax that makes floors slippery. If you must use wax, use non-skid floor wax.  Do not have throw rugs and other things on the floor that can make you trip. What can I do with my stairs?  Do not leave any items on the stairs.  Make sure that there are handrails on both sides of the stairs and use them. Fix handrails that are broken or loose. Make sure that handrails are as long as the stairways.  Check any carpeting to make sure that it is firmly attached to the stairs. Fix any carpet that is loose or worn.  Avoid having throw rugs at the top or bottom of the stairs. If you do have throw rugs, attach them to the floor with carpet tape.  Make sure that you have a light switch at the top of the stairs and the bottom of the stairs. If you do not have them, ask someone to add them for you. What else can I do to help prevent falls?  Wear shoes that:  Do not have high heels.  Have rubber bottoms.  Are comfortable and fit you  well.  Are closed at the toe. Do not wear sandals.  If you use a stepladder:  Make sure that it is fully opened. Do not climb a closed stepladder.  Make sure that both sides of the stepladder are locked into place.  Ask someone to hold it for you, if possible.  Clearly mark and make sure that you can see:  Any grab bars or handrails.  First and last steps.  Where the edge of each step is.  Use tools that help you move around (mobility aids) if they are needed. These include:  Canes.  Walkers.  Scooters.  Crutches.  Turn on the lights when you go into a dark area. Replace any light bulbs as soon as they burn out.  Set up your furniture so you have a clear path. Avoid moving your furniture around.  If any of your floors are uneven, fix them.  If there are any pets around you, be aware of where they are.  Review your medicines with your doctor. Some medicines can make you feel dizzy. This can increase your chance of falling. Ask your doctor what other things that you can do to help prevent falls. This information is not intended to replace advice given to you by your health care provider. Make sure you discuss any questions you have with your health care provider. Document Released: 04/28/2009 Document Revised: 12/08/2015 Document Reviewed: 08/06/2014 Elsevier Interactive Patient Education  2017 [employer].
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
## 2020-03-17 DIAGNOSIS — L820 Inflamed seborrheic keratosis: Secondary | ICD-10-CM | POA: Diagnosis not present

## 2020-03-17 DIAGNOSIS — L570 Actinic keratosis: Secondary | ICD-10-CM | POA: Diagnosis not present

## 2020-03-17 DIAGNOSIS — L304 Erythema intertrigo: Secondary | ICD-10-CM | POA: Diagnosis not present

## 2020-03-24 DIAGNOSIS — H2513 Age-related nuclear cataract, bilateral: Secondary | ICD-10-CM | POA: Diagnosis not present

## 2020-04-04 ENCOUNTER — Ambulatory Visit (INDEPENDENT_AMBULATORY_CARE_PROVIDER_SITE_OTHER): Payer: Medicare Other | Admitting: Psychology

## 2020-04-04 DIAGNOSIS — F322 Major depressive disorder, single episode, severe without psychotic features: Secondary | ICD-10-CM

## 2020-04-22 ENCOUNTER — Ambulatory Visit: Payer: Self-pay | Admitting: Psychology

## 2020-06-21 ENCOUNTER — Emergency Department: Payer: Medicare Other

## 2020-06-21 ENCOUNTER — Other Ambulatory Visit: Payer: Self-pay

## 2020-06-21 ENCOUNTER — Emergency Department
Admission: EM | Admit: 2020-06-21 | Discharge: 2020-06-21 | Disposition: A | Discharge status: Home / Self Care | Payer: Medicare Other | Attending: Emergency Medicine | Admitting: Emergency Medicine

## 2020-06-21 DIAGNOSIS — F17210 Nicotine dependence, cigarettes, uncomplicated: Secondary | ICD-10-CM | POA: Insufficient documentation

## 2020-06-21 DIAGNOSIS — N183 Chronic kidney disease, stage 3 unspecified: Secondary | ICD-10-CM | POA: Insufficient documentation

## 2020-06-21 DIAGNOSIS — M79601 Pain in right arm: Secondary | ICD-10-CM

## 2020-06-21 DIAGNOSIS — J45909 Unspecified asthma, uncomplicated: Secondary | ICD-10-CM | POA: Insufficient documentation

## 2020-06-21 DIAGNOSIS — W010XXA Fall on same level from slipping, tripping and stumbling without subsequent striking against object, initial encounter: Secondary | ICD-10-CM | POA: Diagnosis not present

## 2020-06-21 DIAGNOSIS — M79661 Pain in right lower leg: Secondary | ICD-10-CM | POA: Insufficient documentation

## 2020-06-21 DIAGNOSIS — M542 Cervicalgia: Secondary | ICD-10-CM | POA: Diagnosis not present

## 2020-06-21 DIAGNOSIS — M79604 Pain in right leg: Secondary | ICD-10-CM

## 2020-06-21 DIAGNOSIS — W19XXXA Unspecified fall, initial encounter: Secondary | ICD-10-CM

## 2020-06-21 DIAGNOSIS — M79621 Pain in right upper arm: Secondary | ICD-10-CM | POA: Diagnosis not present

## 2020-06-21 DIAGNOSIS — R519 Headache, unspecified: Secondary | ICD-10-CM | POA: Diagnosis not present

## 2020-06-21 DIAGNOSIS — M25551 Pain in right hip: Secondary | ICD-10-CM | POA: Diagnosis not present

## 2020-06-21 DIAGNOSIS — M25561 Pain in right knee: Secondary | ICD-10-CM | POA: Diagnosis not present

## 2020-06-21 DIAGNOSIS — Y92481 Parking lot as the place of occurrence of the external cause: Secondary | ICD-10-CM | POA: Diagnosis not present

## 2020-06-21 NOTE — ED Triage Notes (Signed)
Pt comes pov after mechanical fall in a parking lot. Pt states entire right side of her body hurts. Neck, arm, shoulder, hip, ankle, thigh. Denies hitting head or LOC. Not on thinners.

## 2020-06-21 NOTE — ED Provider Notes (Signed)
[facility] Emergency Department Provider Note ____________________________________________  Time seen: Approximately 6:53 PM  I have reviewed the triage vital signs and the nursing notes.   HISTORY  Chief Complaint Fall    HPI Regina Nash is a 69 y.o. female who presents to the emergency department for evaluation and treatment of pain after mechanical, nonsyncopal fall in the parking lot of the hospital.  She states that she was here with her daughter who had an MRI.  While walking out of the parking lot, she stepped in a dip and twisted her ankle causing her to fall onto her right side. She has pain in the right upper and lower extremities. No alleviating measures prior to arrival.  Past Medical History:  Diagnosis Date  . Allergy   . Anxiety   . Arrhythmia   . Arthritis   . Asthma   . Chronic bronchitis ([place])   . Chronic kidney disease   . Depression   . Fatty liver   . Gallstones   . Genital warts   . GERD (gastroesophageal reflux disease)   . History of recurrent UTIs   . Hyperglycemia   . IBS (irritable bowel syndrome)   . Migraine   . PTSD (post-traumatic stress disorder)   . Sleep apnea   . Substance abuse ([place])   . Urine incontinence     Patient Active Problem List   Diagnosis Date Noted  . Calculus of gallbladder without cholecystitis without obstruction 10/22/2019  . Right ear pain 10/20/2019  . Tobacco abuse 10/20/2019  . Right hip pain 07/15/2019  . Right upper quadrant pain 07/15/2019  . Chronic bronchitis ([place]) 06/17/2019  . PTSD (post-traumatic stress disorder) 06/17/2019  . Prediabetes 06/17/2019  . Postmenopausal bleeding 03/25/2019  . Obesity (BMI 30.0-34.9) 03/25/2019  . Pain in both knees 09/24/2018  . Facet arthritis of lumbar region 07/14/2018  . Paroxysmal tachycardia ([place]) 05/14/2018  . Abnormal electrocardiogram 05/14/2018  . Hyperlipidemia 05/13/2018  . CKD (chronic kidney disease) stage 3, GFR 30-59 ml/min  (HCC) 04/08/2018  . Neck pain 03/04/2018  . Compression fracture of first lumbar vertebra ([place]) 02/21/2018  . Positive colorectal cancer screening using Cologuard test 11/27/2017  . Encounter for general adult medical examination with abnormal findings 11/20/2017  . Abnormal appearance of cervix 11/20/2017  . Cutaneous horn 11/20/2017  . Chronic low back pain without sciatica 10/15/2017  . Sleep apnea 06/21/2016  . Depression 06/21/2016  . Urinary frequency 06/21/2016    Past Surgical History:  Procedure Laterality Date  . TUBAL LIGATION      Prior to Admission medications   Medication Sig Start Date End Date Taking? Authorizing Provider  calcium carbonate (TUMS - DOSED IN MG ELEMENTAL CALCIUM) 500 MG chewable tablet Chew 1 tablet by mouth daily.    [provider]    Allergies Codeine and Erythromycin  Family History  Problem Relation Age of Onset  . Alcoholism Other        Parent  . Arthritis Other        Parent  . Cancer Other        Breast, ovarian/uterine  . Breast cancer Paternal Aunt        60's  . Alcohol abuse Mother   . Anxiety disorder Mother   . Alcohol abuse Father   . Anxiety disorder Sister   . Anxiety disorder Sister   . Depression Sister   . Rheum arthritis Maternal Grandfather   . Colon cancer Neg Hx  Social History Social History   Tobacco Use  . Smoking status: Current Every Day Smoker    Packs/day: 0.50    Types: Cigarettes  . Smokeless tobacco: Never Used  Vaping Use  . Vaping Use: Never used  Substance Use Topics  . Alcohol use: Never    Alcohol/week: 0.0 standard drinks  . Drug use: No    Review of Systems Constitutional: Negative for fever. Cardiovascular: Negative for chest pain. Respiratory: Negative for shortness of breath. Musculoskeletal: Positive for right upper and lower extremity pain. Skin: Negative for open wounds or lesions.   Neurological: Negative for decrease in  sensation  ____________________________________________   PHYSICAL EXAM:  VITAL SIGNS: ED Triage Vitals  Enc Vitals Group     BP 06/21/20 1641 135/79     Pulse Rate 06/21/20 1641 81     Resp 06/21/20 1641 18     Temp 06/21/20 1641 97.9 F (36.6 C)     Temp Source 06/21/20 1641 Oral     SpO2 06/21/20 1641 97 %     Weight 06/21/20 1642 190 lb (86.2 kg)     Height 06/21/20 1642 5\' 4"  (1.626 m)     Head Circumference --      Peak Flow --      Pain Score 06/21/20 1642 9     Pain Loc --      Pain Edu? --      Excl. in [place]? --     Constitutional: Alert and oriented. Well appearing and in no acute distress. Eyes: Conjunctivae are clear without discharge or drainage Head: Atraumatic Neck: Supple. No focal midline tenderness. Respiratory: No cough. Respirations are even and unlabored. Musculoskeletal: FROM of right upper extremity. FROM of right lower extremity including hip and without pain with internal rotation. Prepatellar swelling of the right knee.  Neurologic: Awake, alert, oriented.  Skin: No open wounds noted.  Psychiatric: Affect and behavior are appropriate.  ____________________________________________   LABS (all labs ordered are listed, but only abnormal results are displayed)  Labs Reviewed - No data to display ____________________________________________  RADIOLOGY  CT head and cervical spine negative for acute findings.   Right knee, humerus, and hip negative for acute findings.  I, Sherrie George, personally viewed and evaluated these images (plain radiographs) as part of my medical decision making, as well as reviewing the written report by the radiologist.  CT Head Wo Contrast  Result Date: 06/21/2020 CLINICAL DATA:  69 year old female status post fall. Right side pain. EXAM: CT HEAD WITHOUT CONTRAST TECHNIQUE: Contiguous axial images were obtained from the base of the skull through the vertex without intravenous contrast. COMPARISON:  None. FINDINGS:  Brain: Cerebral volume is within normal limits for age. No midline shift, ventriculomegaly, mass effect, evidence of mass lesion, intracranial hemorrhage or evidence of cortically based acute infarction. Gray-white matter differentiation is within normal limits throughout the brain. No encephalomalacia identified. Vascular: No suspicious intracranial vascular hyperdensity. Skull: Intact.  No acute osseous abnormality identified. Sinuses/Orbits: Mild mucoperiosteal. Other thickening in the visible left maxillary sinus Visualized paranasal sinuses and mastoids are clear. Other: Visualized orbits and scalp soft tissues are within normal limits. IMPRESSION: Normal for age non contrast CT appearance of the brain. No acute traumatic injury identified. Electronically Signed   By: Genevie Ann M.D.   On: 06/21/2020 17:27   CT Cervical Spine Wo Contrast  Result Date: 06/21/2020 CLINICAL DATA:  Fall, neck pain EXAM: CT CERVICAL SPINE WITHOUT CONTRAST TECHNIQUE: Multidetector CT imaging of the cervical  spine was performed without intravenous contrast. Multiplanar CT image reconstructions were also generated. COMPARISON:  None. FINDINGS: Alignment: Normal alignment of the cervical vertebral bodies. Skull base and vertebrae: Normal craniocervical junction. No loss of vertebral body height or disc height. Normal facet articulation. No evidence of fracture. Soft tissues and spinal canal: No prevertebral soft tissue swelling. No perispinal or epidural hematoma. Disc levels: Disc space narrowing endplate spurring from K4-M0. No acute findings. Upper chest: Clear Other: None IMPRESSION: 1. No cervical spine fracture. 2. Multilevel disc osteophytic disease. Electronically Signed   By: Suzy Bouchard M.D.   On: 06/21/2020 17:29   DG Knee Complete 4 Views Right  Result Date: 06/21/2020 CLINICAL DATA:  Fall.  Right-sided pain. EXAM: RIGHT KNEE - COMPLETE 4+ VIEW COMPARISON:  None. FINDINGS: No evidence of fracture, dislocation, or  joint effusion. No evidence of arthropathy or other focal bone abnormality. Soft tissues are unremarkable. IMPRESSION: No acute abnormality. Electronically Signed   By: San Morelle M.D.   On: 06/21/2020 18:12   DG Humerus Right  Result Date: 06/21/2020 CLINICAL DATA:  Mechanical fall.  Right-sided pain. EXAM: RIGHT HUMERUS - 2+ VIEW COMPARISON:  None. FINDINGS: There is no evidence of fracture or other focal bone lesions. Soft tissues are unremarkable. IMPRESSION: No acute abnormality. Electronically Signed   By: San Morelle M.D.   On: 06/21/2020 18:11   DG Hip Unilat W or Wo Pelvis 2-3 Views Right  Result Date: 06/21/2020 CLINICAL DATA:  Fall.  Right-sided pain. EXAM: DG HIP (WITH OR WITHOUT PELVIS) 2-3V RIGHT COMPARISON:  None. FINDINGS: There is no evidence of hip fracture or dislocation. There is no evidence of arthropathy or other focal bone abnormality. IMPRESSION: No acute abnormality. Electronically Signed   By: San Morelle M.D.   On: 06/21/2020 18:12   ____________________________________________   PROCEDURES  Procedures  ____________________________________________   INITIAL IMPRESSION / ASSESSMENT AND PLAN / ED COURSE  Gabryel Talamo is a 69 y.o. who presents to the emergency department for treatment and evaluation of musculoskeletal pain post fall. See HPI for details.   CT and plain films are negative for acute findings. Patient reassured and would like to be discharged home. She declined prescription medication and states that she will take aspirin if needed for pain. She was advised to follow up with primary care or return to the ER for new concerns.  Medications - No data to display  Pertinent labs & imaging results that were available during my care of the patient were reviewed by me and considered in my medical decision making (see chart for details).   _________________________________________   FINAL CLINICAL IMPRESSION(S) / ED  DIAGNOSES  Final diagnoses:  Fall, initial encounter  Musculoskeletal pain of right lower extremity  Musculoskeletal pain of right upper extremity    ED Discharge Orders    None       If controlled substance prescribed during this visit, 12 month history viewed on the [place] prior to issuing an initial prescription for Schedule II or III opiod.   Victorino Dike, FNP 06/21/20 2108    Carrie Mew, MD 06/21/20 2358

## 2020-08-11 ENCOUNTER — Encounter: Payer: Self-pay | Admitting: Family Medicine

## 2020-08-11 ENCOUNTER — Telehealth (INDEPENDENT_AMBULATORY_CARE_PROVIDER_SITE_OTHER): Payer: Medicare Other | Admitting: Family Medicine

## 2020-08-11 ENCOUNTER — Other Ambulatory Visit: Payer: Self-pay

## 2020-08-11 ENCOUNTER — Other Ambulatory Visit
Admission: RE | Admit: 2020-08-11 | Discharge: 2020-08-11 | Disposition: A | Discharge status: Home / Self Care | Payer: Medicare Other | Source: Ambulatory Visit | Attending: Family Medicine | Admitting: Family Medicine

## 2020-08-11 VITALS — Ht 64.02 in

## 2020-08-11 DIAGNOSIS — Z20822 Contact with and (suspected) exposure to covid-19: Secondary | ICD-10-CM | POA: Insufficient documentation

## 2020-08-11 LAB — BASIC METABOLIC PANEL
Anion gap: 11 (ref 5–15)
BUN: 11 mg/dL (ref 8–23)
CO2: 27 mmol/L (ref 22–32)
Calcium: 9.1 mg/dL (ref 8.9–10.3)
Chloride: 101 mmol/L (ref 98–111)
Creatinine, Ser: 0.85 mg/dL (ref 0.44–1.00)
GFR, Estimated: >60 mL/min (ref >60)
Glucose, Bld: 108 mg/dL — ABNORMAL HIGH (ref 70–99)
Potassium: 3.8 mmol/L (ref 3.5–5.1)
Sodium: 139 mmol/L (ref 135–145)

## 2020-08-11 MED ORDER — FLUTICASONE PROPIONATE 50 MCG/ACT NA SUSP: 2.0000 | Freq: Every day | NASAL | 6 refills | Status: DC

## 2020-08-11 MED ORDER — GUAIFENESIN ER 600 MG PO TB12: 1200.0000 mg | ORAL_TABLET | Freq: Two times a day (BID) | ORAL | 0 refills | Status: DC | PRN

## 2020-08-11 NOTE — Progress Notes (Signed)
Virtual Visit via telephone note  This visit type was conducted due to national recommendations for restrictions regarding the COVID-19 pandemic (e.g. social distancing).  This format is felt to be most appropriate for this patient at this time.  All issues noted in this document were discussed and addressed.  No physical exam was performed (except for noted visual exam findings with Video Visits).   I connected with Regina Nash today at 10:00 AM EST by telephone and verified that I am speaking with the correct person using two identifiers. Location patient: home Location provider: home office Persons participating in the virtual visit: patient, provider  I discussed the limitations, risks, security and privacy concerns of performing an evaluation and management service by telephone and the availability of in person appointments. I also discussed with the patient that there may be a patient responsible charge related to this service. The patient expressed understanding and agreed to proceed.  Interactive audio and video telecommunications were attempted between this provider and patient, however failed, due to patient having technical difficulties OR patient did not have access to video capability.  We continued and completed visit with audio only.   Reason for visit: same day visit  HPI: Suspected COVID: Onset of symptoms about 3 weeks ago.  She notes about a week and a half of symptoms prior to improvement and then had worsening symptoms starting about a week ago.  She had fever in the beginning and had some cough that was mildly productive for a few days though that has improved quite a bit.  She notes at this point she has some sinus congestion though that has started to improve and is thinning out.  She notes clear mucus out of her nose.  Clear productive cough when she does cough.  She does have body aches.  Some rhinorrhea and sneezing.  She has significant fatigue.  Mild postnasal drip.   No fevers in the past week.  She feels as though she might be short of breath if she got up and did anything though she has been fairly inactive.  No taste or smell loss.  Her husband has tested positive for Covid.  The patient is not vaccinated.  She was taking aspirin when she had a fever and using a nasal saline rinse though has not taken any other medications.  She does note her eyes and mouth feel dry and feels as though she may be dehydrated.  She is urinating several times per day and it does vary from moderately yellow to dark yellow.   ROS: See pertinent positives and negatives per HPI.  Past Medical History:  Diagnosis Date  . Allergy   . Anxiety   . Arrhythmia   . Arthritis   . Asthma   . Chronic bronchitis ([place])   . Chronic kidney disease   . Depression   . Fatty liver   . Gallstones   . Genital warts   . GERD (gastroesophageal reflux disease)   . History of recurrent UTIs   . Hyperglycemia   . IBS (irritable bowel syndrome)   . Migraine   . PTSD (post-traumatic stress disorder)   . Sleep apnea   . Substance abuse ([place])   . Urine incontinence     Past Surgical History:  Procedure Laterality Date  . TUBAL LIGATION      Family History  Problem Relation Age of Onset  . Alcoholism Other        Parent  . Arthritis Other  Parent  . Cancer Other        Breast, ovarian/uterine  . Breast cancer Paternal Aunt        60's  . Alcohol abuse Mother   . Anxiety disorder Mother   . Alcohol abuse Father   . Anxiety disorder Sister   . Anxiety disorder Sister   . Depression Sister   . Rheum arthritis Maternal Grandfather   . Colon cancer Neg Hx     SOCIAL HX: Smoker   Current Outpatient Medications:  .  calcium carbonate (TUMS - DOSED IN MG ELEMENTAL CALCIUM) 500 MG chewable tablet, Chew 1 tablet by mouth daily., Disp: , Rfl:  .  fluticasone (FLONASE) 50 MCG/ACT nasal spray, Place 2 sprays into both nostrils daily., Disp: 16 g, Rfl: 6 .  guaiFENesin  (MUCINEX) 600 MG 12 hr tablet, Take 2 tablets (1,200 mg total) by mouth 2 (two) times daily as needed., Disp: 60 tablet, Rfl: 0  EXAM: This was a telephone visit and thus no physical exam was completed.  ASSESSMENT AND PLAN:  Discussed the following assessment and plan:  Problem List Items Addressed This Visit    Suspected COVID-19 virus infection - Primary    I suspect the patient has COVID-19 given her exposure.  She notes she has not been tested due to cost concerns.  She has had symptoms for about 3 weeks with her most recent worsening of symptoms being 7 days ago.  She has progressively been improving though does still have significant fatigue and some congestion.  Discussed at this point she would be able to come off of quarantine though I did suggest she stay home as much as she is able to until she is feeling better.  Advised that she should wear a mask when she goes out of the house.  Discussed that the fatigue could last for weeks or months prior to resolving.  There is some concern that she could be dehydrated and thus she will go to the medical mall at the hospital for lab work to check her kidney function.  I encouraged adequate hydration.  Discussed Mucinex and Flonase for her congestion and mucus.  Advised to seek medical attention if she develops shortness of breath or high elevated fevers.      Relevant Medications   guaiFENesin (MUCINEX) 600 MG 12 hr tablet   fluticasone (FLONASE) 50 MCG/ACT nasal spray   Other Relevant Orders   Basic Metabolic Panel (BMET)       I discussed the assessment and treatment plan with the patient. The patient was provided an opportunity to ask questions and all were answered. The patient agreed with the plan and demonstrated an understanding of the instructions.   The patient was advised to call back or seek an in-person evaluation if the symptoms worsen or if the condition fails to improve as anticipated.  I provided 21 minutes of  non-face-to-face time during this encounter.   Tommi Rumps, MD

## 2020-08-11 NOTE — Assessment & Plan Note (Signed)
I suspect the patient has COVID-19 given her exposure.  She notes she has not been tested due to cost concerns.  She has had symptoms for about 3 weeks with her most recent worsening of symptoms being 7 days ago.  She has progressively been improving though does still have significant fatigue and some congestion.  Discussed at this point she would be able to come off of quarantine though I did suggest she stay home as much as she is able to until she is feeling better.  Advised that she should wear a mask when she goes out of the house.  Discussed that the fatigue could last for weeks or months prior to resolving.  There is some concern that she could be dehydrated and thus she will go to the medical mall at the hospital for lab work to check her kidney function.  I encouraged adequate hydration.  Discussed Mucinex and Flonase for her congestion and mucus.  Advised to seek medical attention if she develops shortness of breath or high elevated fevers.

## 2020-11-09 ENCOUNTER — Other Ambulatory Visit: Payer: Self-pay

## 2020-11-09 ENCOUNTER — Encounter: Payer: Self-pay | Admitting: Family

## 2020-11-09 ENCOUNTER — Ambulatory Visit (INDEPENDENT_AMBULATORY_CARE_PROVIDER_SITE_OTHER): Payer: Medicare Other | Admitting: Family

## 2020-11-09 VITALS — BP 124/86 | HR 80 | Ht 64.0 in | Wt 187.0 lb

## 2020-11-09 DIAGNOSIS — E782 Mixed hyperlipidemia: Secondary | ICD-10-CM | POA: Diagnosis not present

## 2020-11-09 DIAGNOSIS — R0600 Dyspnea, unspecified: Secondary | ICD-10-CM | POA: Diagnosis not present

## 2020-11-09 DIAGNOSIS — R0609 Other forms of dyspnea: Secondary | ICD-10-CM

## 2020-11-09 DIAGNOSIS — I479 Paroxysmal tachycardia, unspecified: Secondary | ICD-10-CM | POA: Diagnosis not present

## 2020-11-09 DIAGNOSIS — Z8616 Personal history of COVID-19: Secondary | ICD-10-CM

## 2020-11-09 DIAGNOSIS — I2510 Atherosclerotic heart disease of native coronary artery without angina pectoris: Secondary | ICD-10-CM

## 2020-11-09 DIAGNOSIS — R002 Palpitations: Secondary | ICD-10-CM

## 2020-11-09 NOTE — Progress Notes (Signed)
Office Visit    Patient Name: Regina Nash Date of Encounter: 11/09/2020  PCP:  Leone Haven, MD   [place]  Cardiologist:  Ida Rogue, MD  Advanced Practice Provider:  No care team member to display Electrophysiologist:  None    Chief Complaint    Regina Nash is a 70 y.o. female with a hx of paroxysmal tachycardia, palpitations, hyperlipidemia, tobacco use, CKD 3, sleep apnea not on CPAP, coronary calcium on CT, depression/PTSD presents today for concerns regarding exercising  Past Medical History    Past Medical History:  Diagnosis Date  . Allergy   . Anxiety   . Arrhythmia   . Arthritis   . Asthma   . Chronic bronchitis ([place])   . Chronic kidney disease   . Depression   . Fatty liver   . Gallstones   . Genital warts   . GERD (gastroesophageal reflux disease)   . History of recurrent UTIs   . Hyperglycemia   . IBS (irritable bowel syndrome)   . Migraine   . PTSD (post-traumatic stress disorder)   . Sleep apnea   . Substance abuse ([place])   . Urine incontinence    Past Surgical History:  Procedure Laterality Date  . TUBAL LIGATION      Allergies  Allergies  Allergen Reactions  . Codeine Other (See Comments)  . Erythromycin Other (See Comments)    abdominal pain    History of Present Illness    Regina Nash is a 70 y.o. female with a hx of  paroxysmal tachycardia, palpitations, hyperlipidemia, tobacco use, CKD 3, sleep apnea not on CPAP, coronary calcium on CT, depression/PTSD last seen 11/03/2019 by Dr. Rockey Situ.  She had cardiac CT 06/2018 with coronary calcium score of 83 placing her in the 79th percentile for age and sex matched control.  When last seen 10/2019 she declined statin.  She was recommended for regular cardiovascular exercise and lifestyle changes.  She had COVID 03 August 2020 that did not require hospitalization.  She has not received COVID vaccinations and is not interested.  Presents today for  follow-up.  She is pleased as to the progress on her smoking cessation and is down from 1.5 PPD to less than 0.5 PPD.No formal exercise routine but does stay busy with household chores. She wonders how much exercise she can do.  She worries that if she does too much exercise she will "stress to her heart ".  Tells me she prefers to not take medications.  Eats most of her meals out of the house but does not add salt.  She does note since COVID she is felt fatigued and notices more dyspnea on exertion.  She is not sure whether this is due to deconditioning or continuing to recover from [place].  EKGs/Labs/Other Studies Reviewed:   The following studies were reviewed today:   EKG:  EKG is ordered today.  The ekg ordered today demonstrates NSR 80 bpm with no acute ST/T wave changes.   Recent Labs: 08/11/2020: BUN 11; Creatinine, Ser 0.85; Potassium 3.8; Sodium 139  Recent Lipid Panel    Component Value Date/Time   CHOL 222 (H) 12/12/2017 1450   TRIG 198.0 (H) 12/12/2017 1450   HDL 40.40 12/12/2017 1450   CHOLHDL 5 12/12/2017 1450   VLDL 39.6 12/12/2017 1450   LDLCALC 142 (H) 12/12/2017 1450   LDLDIRECT 123.0 06/19/2018 1355   Home Medications   Current Meds  Medication Sig  . calcium carbonate (TUMS -  DOSED IN MG ELEMENTAL CALCIUM) 500 MG chewable tablet Chew 1 tablet by mouth daily.     Review of Systems  All other systems reviewed and are otherwise negative except as noted above.  Physical Exam    VS:  BP 124/86 (BP Location: Left Arm, Patient Position: Sitting, Cuff Size: Normal)   Pulse 80   Ht 5\' 4"  (1.626 m)   Wt 187 lb (84.8 kg)   LMP 02/16/2010 (Exact Date)   SpO2 98%   BMI 32.10 kg/m  , BMI Body mass index is 32.1 kg/m.  Wt Readings from Last 3 Encounters:  11/09/20 187 lb (84.8 kg)  06/21/20 190 lb (86.2 kg)  03/14/20 190 lb (86.2 kg)    GEN: Well nourished, well developed, in no acute distress. HEENT: normal. Neck: Supple, no JVD, carotid bruits, or  masses. Cardiac: RRR, no murmurs, rubs, or gallops. No clubbing, cyanosis, edema.  Radials/PT 2+ and equal bilaterally.  Respiratory:  Respirations regular and unlabored, clear to auscultation bilaterally. GI: Soft, nontender, nondistended. MS: No deformity or atrophy. Skin: Warm and dry, no rash. Neuro:  Strength and sensation are intact. Psych: Normal affect.  Assessment & Plan    1. Paroxysmal tachycardia/palpitations- Reports rare palpitations that seem to be triggered by greasy foods.  Reports symptoms are overall controlled.  Recommend stable hydrated, avoid caffeine, manage stress well.  No indication for repeat ZIO monitor at this time.  2. Hyperlipidemia- Declines statin. Discussed lipid lowering diet.   3. CKD 3- Careful titration of diuretics and antihypertensive agents.   4. Coronary calcification on CT scan -EKG today with no acute ST/T changes.  No chest pain, pressure, tightness.  No indication for ischemic evaluation at this time.  Declines aspirin, statin. Heart healthy diet and regular cardiovascular exercise encouraged.   5. Hx of COVID 73 / DOE -DOE etiology likely deconditioning.  Regular cardiovascular exercise encouraged with goal to gradually increase to 150 minutes of moderate intensity exercise per week.  Plan for echocardiogram to rule out valvular abnormality, heart muscle dysfunction, damage from COVID-19.  Disposition: Follow up in 1 year(s) with Dr. Rockey Situ or APP. Sooner appointment will be scheduled if echocardiogram shows acute abnormality  Signed, Loel Dubonnet, NP 11/09/2020, 4:22 PM [place]

## 2020-11-09 NOTE — Patient Instructions (Addendum)
Medication Instructions:  Continue your current medications.   *If you need a refill on your cardiac medications before your next appointment, please call your pharmacy*  Lab Work: None ordered todary.   Testing/Procedures: Your EKG today showed normal sinus rhythm.   Your physician has requested that you have an echocardiogram. Echocardiography is a painless test that uses sound waves to create images of your heart. It provides your doctor with information about the size and shape of your heart and how well your heart's chambers and valves are working. This procedure takes approximately one hour. There are no restrictions for this procedure.   Follow-Up: At [facility], you and your health needs are our priority.  As part of our continuing mission to provide you with exceptional heart care, we have created designated Provider Care Teams.  These Care Teams include your primary Cardiologist (physician) and Advanced Practice Providers (APPs -  Physician Assistants and Nurse Practitioners) who all work together to provide you with the care you need, when you need it.  We recommend signing up for the patient portal called "MyChart".  Sign up information is provided on this After Visit Summary.  MyChart is used to connect with patients for Virtual Visits (Telemedicine).  Patients are able to view lab/test results, encounter notes, upcoming appointments, etc.  Non-urgent messages can be sent to your provider as well.   To learn more about what you can do with MyChart, go to [email or web address].    Your next appointment:   1 year(s)  The format for your next appointment:   In Person  Provider:   You may see Ida Rogue, MD or one of the following Advanced Practice Providers on your designated Care Team:    Murray Hodgkins, NP  Christell Faith, PA-C  Marrianne Mood, PA-C  Cadence Kathlen Mody, [place]  Other Instructions  Try a heat pack or gentle stretches for your back pain.   Heart  Healthy Diet Recommendations: A low-salt diet is recommended. Meats should be grilled, baked, or boiled. Avoid fried foods. Focus on lean protein sources like fish or chicken with vegetables and fruits. The American Heart Association is a Microbiologist!  American Heart Association Diet and Lifeystyle Recommendations   Exercise recommendations: The American Heart Association recommends 150 minutes of moderate intensity exercise weekly. Try 30 minutes of moderate intensity exercise 4-5 times per week. This could include walking, jogging, or swimming.  To prevent palpitations: Make sure you are adequately hydrated.  Avoid and/or limit caffeine containing beverages like soda or tea. Exercise regularly.  Manage stress well. Some over the counter medications can cause palpitations such as Benadryl, AdvilPM, TylenolPM. Regular Advil or Tylenol do not cause palpitations.    Preventing High Cholesterol Cholesterol is a white, waxy substance similar to fat that the human body needs to help build cells. The liver makes all the cholesterol that a person's body needs. Having high cholesterol (hypercholesterolemia) increases your risk for heart disease and stroke. Extra or excess cholesterol comes from the food that you eat. High cholesterol can often be prevented with diet and lifestyle changes. If you already have high cholesterol, you can control it with diet, lifestyle changes, and medicines. How can high cholesterol affect me? If you have high cholesterol, fatty deposits (plaques) may build up on the walls of your blood vessels. The blood vessels that carry blood away from your heart are called arteries. Plaques make the arteries narrower and stiffer. This in turn can:  Restrict or block blood flow and  cause blood clots to form.  Increase your risk for heart attack and stroke. What can increase my risk for high cholesterol? This condition is more likely to develop in people who:  Eat foods that  are high in saturated fat or cholesterol. Saturated fat is mostly found in foods that come from animal sources.  Are overweight.  Are not getting enough exercise.  Have a family history of high cholesterol (familial hypercholesterolemia). What actions can I take to prevent this? Nutrition  Eat less saturated fat.  Avoid trans fats (partially hydrogenated oils). These are often found in margarine and in some baked goods, fried foods, and snacks bought in packages.  Avoid precooked or cured meat, such as bacon, sausages, or meat loaves.  Avoid foods and drinks that have added sugars.  Eat more fruits, vegetables, and whole grains.  Choose healthy sources of protein, such as fish, poultry, lean cuts of red meat, beans, peas, lentils, and nuts.  Choose healthy sources of fat, such as: ? Nuts. ? Vegetable oils, especially olive oil. ? Fish that have healthy fats, such as omega-3 fatty acids. These fish include mackerel or salmon.   Lifestyle  Lose weight if you are overweight. Maintaining a healthy body mass index (BMI) can help prevent or control high cholesterol. It can also lower your risk for diabetes and high blood pressure. Ask your health care provider to help you with a diet and exercise plan to lose weight safely.  Do not use any products that contain nicotine or tobacco, such as cigarettes, e-cigarettes, and chewing tobacco. If you need help quitting, ask your health care provider. Alcohol use  Do not drink alcohol if: ? Your health care provider tells you not to drink. ? You are pregnant, may be pregnant, or are planning to become pregnant.  If you drink alcohol: ? Limit how much you use to:  0-1 drink a day for women.  0-2 drinks a day for men. ? Be aware of how much alcohol is in your drink. In the [place] one drink equals one 12 oz bottle of beer (355 mL), one 5 oz glass of wine (148 mL), or one 1 oz glass of hard liquor (44 mL). Activity  Get enough exercise. Do  exercises as told by your health care provider.  Each week, do at least 150 minutes of exercise that takes a medium level of effort (moderate-intensity exercise). This kind of exercise: ? Makes your heart beat faster while allowing you to still be able to talk. ? Can be done in short sessions several times a day or longer sessions a few times a week. For example, on 5 days each week, you could walk fast or ride your bike 3 times a day for 10 minutes each time.   Medicines  Your health care provider may recommend medicines to help lower cholesterol. This may be a medicine to lower the amount of cholesterol that your liver makes. You may need medicine if: ? Diet and lifestyle changes have not lowered your cholesterol enough. ? You have high cholesterol and other risk factors for heart disease or stroke.  Take over-the-counter and prescription medicines only as told by your health care provider. General information  Manage your risk factors for high cholesterol. Talk with your health care provider about all your risk factors and how to lower your risk.  Manage other conditions that you have, such as diabetes or high blood pressure (hypertension).  Have blood tests to check your cholesterol levels  at regular points in time as told by your health care provider.  Keep all follow-up visits as told by your health care provider. This is important. Where to find more information  American Heart Association: www.heart.org  National Heart, Lung, and Blood Institute: https://wilson-eaton.com/ Summary  High cholesterol increases your risk for heart disease and stroke. By keeping your cholesterol level low, you can reduce your risk for these conditions.  High cholesterol can often be prevented with diet and lifestyle changes.  Work with your health care provider to manage your risk factors, and have your blood tested regularly. This information is not intended to replace advice given to you by your health  care provider. Make sure you discuss any questions you have with your health care provider. Document Revised: 04/14/2019 Document Reviewed: 04/14/2019 Elsevier Patient Education  [place].

## 2020-11-14 ENCOUNTER — Telehealth: Payer: Self-pay | Admitting: Family Medicine

## 2020-11-14 DIAGNOSIS — L989 Disorder of the skin and subcutaneous tissue, unspecified: Secondary | ICD-10-CM

## 2020-11-14 NOTE — Telephone Encounter (Signed)
Patient called in wanted a referral to a dermatologist

## 2020-11-15 NOTE — Telephone Encounter (Signed)
Referral placed.

## 2020-12-05 ENCOUNTER — Other Ambulatory Visit: Payer: Self-pay

## 2020-12-05 ENCOUNTER — Ambulatory Visit (INDEPENDENT_AMBULATORY_CARE_PROVIDER_SITE_OTHER): Payer: Medicare Other | Admitting: Adult Health

## 2020-12-05 ENCOUNTER — Encounter: Payer: Self-pay | Admitting: Adult Health

## 2020-12-05 ENCOUNTER — Ambulatory Visit (INDEPENDENT_AMBULATORY_CARE_PROVIDER_SITE_OTHER): Payer: Medicare Other

## 2020-12-05 VITALS — BP 119/82 | HR 78 | Temp 98.1°F | Wt 188.4 lb

## 2020-12-05 DIAGNOSIS — M542 Cervicalgia: Secondary | ICD-10-CM | POA: Diagnosis not present

## 2020-12-05 DIAGNOSIS — M25511 Pain in right shoulder: Secondary | ICD-10-CM

## 2020-12-05 DIAGNOSIS — G8929 Other chronic pain: Secondary | ICD-10-CM

## 2020-12-05 DIAGNOSIS — Z9181 History of falling: Secondary | ICD-10-CM

## 2020-12-05 DIAGNOSIS — E669 Obesity, unspecified: Secondary | ICD-10-CM | POA: Diagnosis not present
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
## 2020-12-05 NOTE — Patient Instructions (Signed)
Shoulder Pain Many things can cause shoulder pain, including:  An injury to the shoulder.  Overuse of the shoulder.  Arthritis. The source of the pain can be:  Inflammation.  An injury to the shoulder joint.  An injury to a tendon, ligament, or bone. Follow these instructions at home: Pay attention to changes in your symptoms. Let your health care provider know about them. Follow these instructions to relieve your pain. If you have a sling:  Wear the sling as told by your health care provider. Remove it only as told by your health care provider.  Loosen the sling if your fingers tingle, become numb, or turn cold and blue.  Keep the sling clean.  If the sling is not waterproof: ? Do not let it get wet. Remove it to shower or bathe.  Move your arm as little as possible, but keep your hand moving to prevent swelling. Managing pain, stiffness, and swelling  If directed, put ice on the painful area: ? Put ice in a plastic bag. ? Place a towel between your skin and the bag. ? Leave the ice on for 20 minutes, 2-3 times per day. Stop applying ice if it does not help with the pain.  Squeeze a soft ball or a foam pad as much as possible. This helps to keep the shoulder from swelling. It also helps to strengthen the arm.   General instructions  Take over-the-counter and prescription medicines only as told by your health care provider.  Keep all follow-up visits as told by your health care provider. This is important. Contact a health care provider if:  Your pain gets worse.  Your pain is not relieved with medicines.  New pain develops in your arm, hand, or fingers. Get help right away if:  Your arm, hand, or fingers: ? Tingle. ? Become numb. ? Become swollen. ? Become painful. ? Turn white or blue. Summary  Shoulder pain can be caused by an injury, overuse, or arthritis.  Pay attention to changes in your symptoms. Let your health care provider know about  them.  This condition may be treated with a sling, ice, and pain medicines.  Contact your health care provider if the pain gets worse or new pain develops. Get help right away if your arm, hand, or fingers tingle or become numb, swollen, or painful.  Keep all follow-up visits as told by your health care provider. This is important. This information is not intended to replace advice given to you by your health care provider. Make sure you discuss any questions you have with your health care provider. Document Revised: 01/14/2018 Document Reviewed: 01/14/2018 Elsevier Patient Education  2021 Elsevier Inc.  

## 2020-12-07 NOTE — Progress Notes (Signed)
Degenerative disc on cervical spine, no acute osseus abnormalities. Right shoulder shows mild demineralization and otherwise negative.   Would recommend orthopedic referral fort further work up / imaging if pain in neck/ right shoulder is persistent. Let me know if patient would like referral.

## 2020-12-07 NOTE — Progress Notes (Signed)
Noted patient declined referral to orthopedics at this time.

## 2020-12-20 ENCOUNTER — Other Ambulatory Visit: Payer: Medicare Other

## 2020-12-21 ENCOUNTER — Other Ambulatory Visit: Payer: Medicare Other

## 2020-12-28 ENCOUNTER — Ambulatory Visit: Payer: Medicare Other | Admitting: Family Medicine

## 2021-02-01 ENCOUNTER — Telehealth: Payer: Self-pay | Admitting: Family Medicine

## 2021-02-01 NOTE — Telephone Encounter (Signed)
PT Transfer to AccessNurse   PT called in wanting to be seen this week for a red itchy swollen eye. We have nothing available so transfer to AccessNurse and advise for further assistance.

## 2021-02-02 NOTE — Telephone Encounter (Signed)
Spoken to patient, she stated she disconnected the call while talking to access nurse because all she wanted was an appointment. She stated she has a lump on her lower eyelid and wanted someone to look at it. Her eyelid is swollen and a little itchy when she moves or blinks her eye. She stated she is going to put warm compresses on eye and made an appointment for Monday with Mable Paris, FNP. She stated she will wait until then did instruct npatient that if her sx worsen or she has blurry vision she needs to go to UC/ED. Patient refused to go to UC/ED today. Patient stated she does not feel it is an emergent issue.

## 2021-02-02 NOTE — Telephone Encounter (Signed)
Noted.  Agree with the need for evaluation.  It sounds as though this can wait until next week.  This may be a stye.  Warm compresses should be used.  If she develops any vision changes or pain she should be evaluated over the weekend.

## 2021-02-02 NOTE — Telephone Encounter (Signed)
Left message for patient to return call back.  

## 2021-02-06 ENCOUNTER — Ambulatory Visit: Payer: Medicare Other | Admitting: Family

## 2021-03-15 ENCOUNTER — Telehealth: Payer: Self-pay | Admitting: Family Medicine

## 2021-03-15 ENCOUNTER — Ambulatory Visit: Payer: Medicare Other

## 2021-03-15 DIAGNOSIS — G8929 Other chronic pain: Secondary | ICD-10-CM

## 2021-03-15 NOTE — Telephone Encounter (Signed)
Left message asking pt to call   I gave both office number and my number [phone]  Please r/s 03/15/21 awv  Denisa will be out of office

## 2021-03-15 NOTE — Telephone Encounter (Signed)
[facility]. She was told by them that her insurance  would not cover for obesity but would cover for Stage 3 kidney Disease. She would like another referral to [facility]  since she has Stage 3 kidney disease.

## 2021-03-15 NOTE — Telephone Encounter (Signed)
Tried calling again @ 12:43  went to voicemail

## 2021-03-15 NOTE — Telephone Encounter (Signed)
Tried calling patient again  went to voicemail

## 2021-03-15 NOTE — Telephone Encounter (Signed)
Referral placed.  Please confirm that this is related to the right shoulder issue she was having in May when she saw [place].

## 2021-03-15 NOTE — Telephone Encounter (Signed)
I called and spoke with the patient and she stated it was the same issue when she saw michelle in May.  Alekai Pocock,cma

## 2021-03-15 NOTE — Telephone Encounter (Signed)
Patient was referred to [facility]. She was told by them that her insurance  would not cover for obesity but would cover for Stage 3 kidney Disease. She would like another referral to [facility]  since she has Stage 3 kidney disease.  Kelina Beauchamp,cma

## 2021-03-15 NOTE — Telephone Encounter (Signed)
Patient is requesting a referral to Ortho for her rt shoulder. She prefers to stay in the [place] area.

## 2021-03-15 NOTE — Telephone Encounter (Signed)
Patient is requesting a referral to Ortho for her rt shoulder. She prefers to stay in the [place] area. Zehava Turski,cma

## 2021-03-16 NOTE — Telephone Encounter (Signed)
Her kidney function was actually in the normal range on her most recent check. Would she be willing to schedule a visit to discuss her weight, diet, exercise, and to recheck her kidney function to see where it is now?

## 2021-03-17 NOTE — Telephone Encounter (Signed)
Patient calling back in. Scheduled

## 2021-03-17 NOTE — Telephone Encounter (Signed)
I called and LVM for the patient to call back. Shizuko Wojdyla,cma   

## 2021-03-21 DIAGNOSIS — H25813 Combined forms of age-related cataract, bilateral: Secondary | ICD-10-CM | POA: Diagnosis not present

## 2021-04-12 ENCOUNTER — Other Ambulatory Visit: Payer: Self-pay

## 2021-04-12 ENCOUNTER — Ambulatory Visit (INDEPENDENT_AMBULATORY_CARE_PROVIDER_SITE_OTHER): Payer: Medicare Other | Admitting: Family Medicine

## 2021-04-12 VITALS — BP 120/78 | HR 89 | Temp 97.8°F | Ht 64.0 in | Wt 189.2 lb

## 2021-04-12 DIAGNOSIS — Z8249 Family history of ischemic heart disease and other diseases of the circulatory system: Secondary | ICD-10-CM | POA: Insufficient documentation

## 2021-04-12 DIAGNOSIS — Z720 Tobacco use: Secondary | ICD-10-CM | POA: Diagnosis not present

## 2021-04-12 DIAGNOSIS — E669 Obesity, unspecified: Secondary | ICD-10-CM

## 2021-04-12 DIAGNOSIS — F4310 Post-traumatic stress disorder, unspecified: Secondary | ICD-10-CM

## 2021-04-12 DIAGNOSIS — E66811 Obesity, class 1: Secondary | ICD-10-CM

## 2021-04-12 DIAGNOSIS — E782 Mixed hyperlipidemia: Secondary | ICD-10-CM | POA: Diagnosis not present

## 2021-04-12 DIAGNOSIS — L659 Nonscarring hair loss, unspecified: Secondary | ICD-10-CM

## 2021-04-12 DIAGNOSIS — I2510 Atherosclerotic heart disease of native coronary artery without angina pectoris: Secondary | ICD-10-CM | POA: Diagnosis not present

## 2021-04-12 DIAGNOSIS — R7303 Prediabetes: Secondary | ICD-10-CM

## 2021-04-12 NOTE — Assessment & Plan Note (Signed)
Encourage smoking cessation.  The patient was counseled on the risk of COPD, lung cancer, and cardiovascular disease.

## 2021-04-12 NOTE — Assessment & Plan Note (Signed)
Discussed adding in walking 2 to 3 days a week for exercise.  Discussed dietary changes including increasing her protein intake as well as getting adequate fiber intake.

## 2021-04-12 NOTE — Assessment & Plan Note (Signed)
Check A1c.  Encouraged adding in exercise and dietary changes.

## 2021-04-12 NOTE — Patient Instructions (Signed)
Nice to see you. We will get labs today.  Please try to work on dietary changes and exercise. You can start walking 2 to 3 days a week for exercise. I will check on a therapist for you.  Nonalcoholic Fatty Liver Disease Diet, Adult Nonalcoholic fatty liver disease is a condition that causes fat to build up in and around the liver. The disease makes it harder for the liver to work the way that it should. Following a healthy diet can help to keep nonalcoholic fatty liver disease under control. It can also help to prevent or improve conditions that are associated with the disease, such as heart disease, diabetes, high blood pressure, and abnormal cholesterol levels. Along with regular exercise, this diet: Promotes weight loss. Helps to control blood sugar levels. Helps to improve the way that the body uses insulin. What are tips for following this plan? Reading food labels Always check food labels for: The amount of saturated fat in a food. You should limit your intake of saturated fat. Saturated fat is found in foods that come from animals, including meat and dairy products such as butter, cheese, and whole milk. The amount of fiber in a food. You should choose high-fiber foods such as fruits, vegetables, and whole grains. Try to get 25-30 grams (g) of fiber a day.  Cooking When cooking, use heart-healthy oils that are high in monounsaturated fats. These include olive oil, canola oil, and avocado oil. Limit frying or deep-frying foods. Cook foods using healthy methods such as baking, boiling, steaming, and grilling instead. Meal planning You may want to keep track of how many calories you take in. Eating the right amount of calories will help you achieve a healthy weight. Meeting with a registered dietitian can help you get started. Limit how often you eat takeout and fast food. These foods are usually very high in fat, salt, and sugar. Use the glycemic index (GI) to plan your meals. The index  tells you how quickly a food will raise your blood sugar. Choose low-GI foods (GI less than 55). These foods take a longer time to raise blood sugar. A registered dietitian can help you identify foods lower on the GI scale. Lifestyle You may want to follow a Mediterranean diet. This diet includes a lot of vegetables, lean meats or fish, whole grains, fruits, and healthy oils and fats. What foods can I eat? Fruits Bananas. Apples. Oranges. Grapes. Papaya. Mango. Pomegranate. Kiwi. Grapefruit. Cherries. Vegetables Lettuce. Spinach. Peas. Beets. Cauliflower. Cabbage. Broccoli. Carrots. Tomatoes. Squash. Eggplant. Herbs. Peppers. Onions. Cucumbers. Brussels sprouts. Yams and sweet potatoes. Beans. Lentils. Grains Whole wheat or whole-grain foods, including breads, crackers, cereals, and pasta. Stone-ground whole wheat. Unsweetened oatmeal. Bulgur. Barley. Quinoa. Brown or wild rice. Corn or whole wheat flour tortillas. Meats and other proteins Lean meats. Poultry. Tofu. Seafood and shellfish. Dairy Low-fat or fat-free dairy products, such as yogurt, cottage cheese, or cheese. Beverages Water. Sugar-free drinks. Tea. Coffee. Low-fat or skim milk. Milk alternatives, such as soy or almond milk. Real fruit juice. Fats and oils Avocado. Canola or olive oil. Nuts and nut butters. Seeds. Seasonings and condiments Mustard. Relish. Low-fat, low-sugar ketchup and barbecue sauce. Low-fat or fat-free mayonnaise. Sweets and desserts Sugar-free sweets. The items listed above may not be a complete list of foods and beverages you can eat. Contact a dietitian for more information. What foods should I limit or avoid? Meats and other proteins Limit red meat to 1-2 times a week. Dairy [employer]. Fats  and oils Palm oil and coconut oil. Fried foods. Other foods Processed foods. Foods that contain a lot of salt or sodium. Sweets and desserts Sweets that contain sugar. Beverages Sweetened drinks, such as  sweet tea, milkshakes, iced sweet drinks, and sodas. Alcohol. The items listed above may not be a complete list of foods and beverages you should avoid. Contact a dietitian for more information. Where to find more information The [employer] of Diabetes and Digestive and Kidney Diseases: [employer] Summary Nonalcoholic fatty liver disease is a condition that causes fat to build up in and around the liver. Following a healthy diet can help to keep nonalcoholic fatty liver disease under control. Your diet should be rich in fruits, vegetables, whole grains, and lean proteins. Limit your intake of saturated fat. Saturated fat is found in foods that come from animals, including meat and dairy products such as butter, cheese, and whole milk. This diet promotes weight loss, helps to control blood sugar levels, and helps to improve the way that the body uses insulin. This information is not intended to replace advice given to you by your health care provider. Make sure you discuss any questions you have with your health care provider. Document Revised: 10/24/2018 Document Reviewed: 07/24/2018 Elsevier Patient Education  [place].

## 2021-04-12 NOTE — Assessment & Plan Note (Signed)
Discussed that most of these are not genetic.  Advised that she had a CT scan in 2019 that was read as a normal diameter ascending aorta.

## 2021-04-12 NOTE — Assessment & Plan Note (Signed)
This is a chronic issue.  She has not been able to find a therapist that is helpful.  We will communicate with one of our psychiatry colleagues to see if they know of a therapist that specifically treats PTSD.
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
## 2021-04-13 LAB — COMPREHENSIVE METABOLIC PANEL
ALT: 35 U/L (ref 0–35)
AST: 23 U/L (ref 0–37)
Albumin: 4.1 g/dL (ref 3.5–5.2)
Alkaline Phosphatase: 68 U/L (ref 39–117)
BUN: 13 mg/dL (ref 6–23)
CO2: 29 mEq/L (ref 19–32)
Calcium: 9.7 mg/dL (ref 8.4–10.5)
Chloride: 103 mEq/L (ref 96–112)
Creatinine, Ser: 0.97 mg/dL (ref 0.40–1.20)
GFR: 60.17 mL/min (ref >60.00)
Glucose, Bld: 94 mg/dL (ref 70–99)
Potassium: 4.1 mEq/L (ref 3.5–5.1)
Sodium: 139 mEq/L (ref 135–145)
Total Bilirubin: 0.4 mg/dL (ref 0.2–1.2)
Total Protein: 6.9 g/dL (ref 6.0–8.3)

## 2021-04-13 LAB — LIPID PANEL
Cholesterol: 207 mg/dL — ABNORMAL HIGH (ref 0–200)
HDL: 46.1 mg/dL (ref >39.00)
LDL Cholesterol: 125 mg/dL — ABNORMAL HIGH (ref 0–99)
NonHDL: 160.48
Total CHOL/HDL Ratio: 4
Triglycerides: 176 mg/dL — ABNORMAL HIGH (ref 0.0–149.0)
VLDL: 35.2 mg/dL (ref 0.0–40.0)

## 2021-04-13 LAB — HEMOGLOBIN A1C: Hgb A1c MFr Bld: 6 % (ref 4.6–6.5)

## 2021-04-13 LAB — TSH: TSH: 2.8 u[IU]/mL (ref 0.35–5.50)

## 2021-04-20 DIAGNOSIS — S46011A Strain of muscle(s) and tendon(s) of the rotator cuff of right shoulder, initial encounter: Secondary | ICD-10-CM | POA: Diagnosis not present

## 2021-04-20 DIAGNOSIS — M25511 Pain in right shoulder: Secondary | ICD-10-CM | POA: Diagnosis not present

## 2021-05-02 ENCOUNTER — Encounter: Payer: Self-pay | Admitting: General Surgery

## 2021-05-09 DIAGNOSIS — H1132 Conjunctival hemorrhage, left eye: Secondary | ICD-10-CM | POA: Diagnosis not present

## 2021-05-16 DIAGNOSIS — Z20828 Contact with and (suspected) exposure to other viral communicable diseases: Secondary | ICD-10-CM | POA: Diagnosis not present

## 2021-05-18 ENCOUNTER — Ambulatory Visit: Payer: Medicare Other | Admitting: Dermatology

## 2021-05-18 ENCOUNTER — Ambulatory Visit: Payer: Medicare Other

## 2021-05-18 ENCOUNTER — Ambulatory Visit (INDEPENDENT_AMBULATORY_CARE_PROVIDER_SITE_OTHER): Payer: Medicare Other | Admitting: Dermatology

## 2021-05-18 ENCOUNTER — Other Ambulatory Visit: Payer: Self-pay

## 2021-05-18 DIAGNOSIS — B079 Viral wart, unspecified: Secondary | ICD-10-CM | POA: Diagnosis not present

## 2021-05-18 DIAGNOSIS — E568 Deficiency of other vitamins: Secondary | ICD-10-CM

## 2021-05-18 DIAGNOSIS — L821 Other seborrheic keratosis: Secondary | ICD-10-CM | POA: Diagnosis not present

## 2021-05-18 DIAGNOSIS — E569 Vitamin deficiency, unspecified: Secondary | ICD-10-CM

## 2021-05-18 DIAGNOSIS — L650 Telogen effluvium: Secondary | ICD-10-CM

## 2021-05-18 DIAGNOSIS — L820 Inflamed seborrheic keratosis: Secondary | ICD-10-CM

## 2021-05-18 DIAGNOSIS — R638 Other symptoms and signs concerning food and fluid intake: Secondary | ICD-10-CM

## 2021-05-18 DIAGNOSIS — L578 Other skin changes due to chronic exposure to nonionizing radiation: Secondary | ICD-10-CM | POA: Diagnosis not present

## 2021-05-18 DIAGNOSIS — M818 Other osteoporosis without current pathological fracture: Secondary | ICD-10-CM | POA: Diagnosis not present

## 2021-05-18 MED ORDER — FLUOROURACIL 5 % EX CREA
TOPICAL_CREAM | CUTANEOUS | 0 refills | Status: DC
Start: 2021-05-18 — End: 2023-02-25

## 2021-05-18 NOTE — Patient Instructions (Addendum)
Start 5-fluorouracil/calcipotriene cream twice a day for 4 days to affected areas including the nsoe. Prescription sent to [facility]. Patient provided with contact information for pharmacy and advised the pharmacy will mail the prescription to their home. Patient provided with handout reviewing treatment course and side effects and advised to call or message [place] on MyChart with any concerns.   5-fluorouracil/calcipotriene cream is is a type of field treatment used to treat precancers, thin skin cancers, and areas of sun damage. Reviewed expected reaction including irritation and mild inflammation potentially progressing to more severe inflammation including redness, scaling, crusting and open sores/erosions.  Reviewed if too much irritation occurs, ensure application of only a thin layer and decrease frequency of use to achieve a tolerable level of inflammation. Recommend applying Vaseline ointment to open sores as needed.  Minimize sun exposure while under treatment. Recommend daily broad spectrum sunscreen SPF 30+ to sun-exposed areas, reapply every 2 hours as needed.    If you have any questions or concerns for your doctor, please call our main line at [phone] and press option 4 to reach your doctor's medical assistant. If no one answers, please leave a voicemail as directed and we will return your call as soon as possible. Messages left after 4 pm will be answered the following business day.   You may also send [place] a message via [place]. We typically respond to MyChart messages within 1-2 business days.  For prescription refills, please ask your pharmacy to contact our office. Our fax number is [phone].  If you have an urgent issue when the clinic is closed that cannot wait until the next business day, you can page your doctor at the number below.    Please note that while we do our best to be available for urgent issues outside of office hours, we are not available 24/7.   If you  have an urgent issue and are unable to reach [place], you may choose to seek medical care at your doctor's office, retail clinic, urgent care center, or emergency room.  If you have a medical emergency, please immediately call 911 or go to the emergency department.  Pager Numbers  - Dr. Nehemiah Massed: [phone]  - Dr. Laurence Ferrari: [phone]  - Dr. Nicole Kindred: [phone]  In the event of inclement weather, please call our main line at [phone] for an update on the status of any delays or closures.  Dermatology Medication Tips: Please keep the boxes that topical medications come in in order to help keep track of the instructions about where and how to use these. Pharmacies typically print the medication instructions only on the boxes and not directly on the medication tubes.   If your medication is too expensive, please contact our office at [phone] option 4 or send [place] a message through [place].   We are unable to tell what your co-pay for medications will be in advance as this is different depending on your insurance coverage. However, we may be able to find a substitute medication at lower cost or fill out paperwork to get insurance to cover a needed medication.   If a prior authorization is required to get your medication covered by your insurance company, please allow [place] 1-2 business days to complete this process.  Drug prices often vary depending on where the prescription is filled and some pharmacies may offer cheaper prices.  The website www.goodrx.com contains coupons for medications through different pharmacies. The prices here do not account for what the cost may be with  help from insurance (it may be cheaper with your insurance), but the website can give you the price if you did not use any insurance.  - You can print the associated coupon and take it with your prescription to the pharmacy.  - You may also stop by our office during regular business hours and pick up a GoodRx coupon  card.  - If you need your prescription sent electronically to a different pharmacy, notify our office through [facility] or by phone at [phone] option 4.

## 2021-05-18 NOTE — Progress Notes (Signed)
New Patient Visit  Subjective  Regina Nash is a 70 y.o. female who presents for the following: irregular skin lesions (Itchy and irritated, patient would like treated today - L popliteal, L brow). Patient c/o significant all over hair loss after contracting COVID in February. Patient reports a poor diet with hair loss and reports recent diagnosis of osteoporosis.  The following portions of the chart were reviewed this encounter and updated as appropriate:   Tobacco  Allergies  Meds  Problems  Med Hx  Surg Hx  Fam Hx      Review of Systems:  No other skin or systemic complaints except as noted in HPI or Assessment and Plan.  Objective  Well appearing patient in no apparent distress; mood and affect are within normal limits.  A focused examination was performed including the face and extremities. Relevant physical exam findings are noted in the Assessment and Plan.  Scalp Diffuse thinning of hair, positive hair pull test.   L popliteal x 1, L lat thigh x 1 Erythematous keratotic or waxy stuck-on papule or plaque.   L ant thigh x 1, L med thigh x 1 (2) Verrucous papules -- Discussed viral etiology and contagion.    Assessment & Plan  Actinic skin damage  Related Medications fluorouracil (EFUDEX) 5 % cream Apply a thin coat to the nose BID x 4 days.  Telogen effluvium Scalp  S/P covid - Telogen effluvium is a benign, self limited condition causing increased hair shedding usually for several months. It does not progress to baldness, and the hair eventually grows back on its own. It can be triggered by recent illness, recent surgery, thyroid disease, low iron stores, vitamin D deficiency, fad diets or rapid weight loss, hormonal changes such as pregnancy or birth control pills, and some medication. Usually the hair loss starts 2-3 months after the illness or health change. Rarely, it can continue for longer than a year.  Patient reports a poor diet and recent diagnosis of  osteoporosis.  Will also check vitamin D and ferritin.   Ferritin - Scalp  Vitamin D, 25-hydroxy - Scalp  Inflamed seborrheic keratosis L popliteal x 1, L lat thigh x 1  Benign-appearing. Symptomatic.   L upper eyelid  - patient declines treatment today  Destruction of lesion - L popliteal x 1, L lat thigh x 1 Complexity: simple   Destruction method: cryotherapy   Informed consent: discussed and consent obtained   Timeout:  patient name, date of birth, surgical site, and procedure verified Lesion destroyed using liquid nitrogen: Yes   Region frozen until ice ball extended beyond lesion: Yes   Outcome: patient tolerated procedure well with no complications   Post-procedure details: wound care instructions given    Viral warts, unspecified type (2) L ant thigh x 1, L med thigh x 1  Discussed viral etiology and risk of spread.  Discussed multiple treatments may be required to clear warts.  Discussed possible post-treatment dyspigmentation and risk of recurrence.  Prior to procedure, discussed risks of blister formation, small wound, skin dyspigmentation, or rare scar following cryotherapy. Recommend Vaseline ointment to treated areas while healing.   Destruction of lesion - L ant thigh x 1, L med thigh x 1 Complexity: simple   Destruction method: cryotherapy   Informed consent: discussed and consent obtained   Timeout:  patient name, date of birth, surgical site, and procedure verified Lesion destroyed using liquid nitrogen: Yes   Region frozen until ice ball extended beyond lesion:  Yes   Outcome: patient tolerated procedure well with no complications   Post-procedure details: wound care instructions given    Vitamin deficiency  Related Procedures Ferritin Vitamin D, 25-hydroxy  Other symptoms and signs concerning food and fluid intake  Related Procedures Ferritin  Other osteoporosis without current pathological fracture  Related Procedures Vitamin D,  25-hydroxy  Seborrheic Keratoses - Stuck-on, waxy, tan-brown papules and/or plaques  - Benign-appearing - Discussed benign etiology and prognosis. - Observe - Call for any changes  Actinic Damage - Severe, confluent actinic changes with pre-cancerous actinic keratoses  - Severe, chronic, not at goal, secondary to cumulative UV radiation exposure over time - diffuse scaly erythematous macules and papules with underlying dyspigmentation - Discussed Prescription "Field Treatment" for Severe, Chronic Confluent Actinic Changes with Pre-Cancerous Actinic Keratoses Field treatment involves treatment of an entire area of skin that has confluent Actinic Changes (Sun/ Ultraviolet light damage) and PreCancerous Actinic Keratoses by method of PhotoDynamic Therapy (PDT) and/or prescription Topical Chemotherapy agents such as 5-fluorouracil, 5-fluorouracil/calcipotriene, and/or imiquimod.  The purpose is to decrease the number of clinically evident and subclinical PreCancerous lesions to prevent progression to development of skin cancer by chemically destroying early precancer changes that may or may not be visible.  It has been shown to reduce the risk of developing skin cancer in the treated area. As a result of treatment, redness, scaling, crusting, and open sores may occur during treatment course. One or more than one of these methods may be used and may have to be used several times to control, suppress and eliminate the PreCancerous changes. Discussed treatment course, expected reaction, and possible side effects. - Recommend daily broad spectrum sunscreen SPF 30+ to sun-exposed areas, reapply every 2 hours as needed.  - Staying in the shade or wearing long sleeves, sun glasses (UVA+UVB protection) and wide brim hats (4-inch brim around the entire circumference of the hat) are also recommended. - Call for new or changing lesions.  - Start Calcipotriene/5FU mix BID x 4 days to the nose.    Return in about  3 months (around 08/18/2021) for TBSE - recheck actinic damage, ISK's, warts.  Luther Redo, CMA, am acting as scribe for Forest Gleason, MD .  Documentation: I have reviewed the above documentation for accuracy and completeness, and I agree with the above.  Forest Gleason, MD

## 2021-05-19 LAB — VITAMIN D 25 HYDROXY (VIT D DEFICIENCY, FRACTURES): Vit D, 25-Hydroxy: 21 ng/mL — ABNORMAL LOW (ref 30.0–100.0)

## 2021-05-19 LAB — FERRITIN: Ferritin: 213 ng/mL — ABNORMAL HIGH (ref 15–150)

## 2021-05-22 ENCOUNTER — Encounter: Payer: Self-pay | Admitting: Family Medicine

## 2021-05-22 ENCOUNTER — Other Ambulatory Visit: Payer: Self-pay | Admitting: Family Medicine

## 2021-05-22 DIAGNOSIS — N6459 Other signs and symptoms in breast: Secondary | ICD-10-CM

## 2021-05-22 DIAGNOSIS — Q839 Congenital malformation of breast, unspecified: Secondary | ICD-10-CM

## 2021-05-23 ENCOUNTER — Telehealth: Payer: Self-pay

## 2021-05-23 NOTE — Telephone Encounter (Signed)
Called and spoke with Jana Half. Grayson verbalized understanding and states that she has already been contacted by [facility] for a mammogram. Pt has an upcoming appointment on 06/2021 with PCP.

## 2021-05-23 NOTE — Telephone Encounter (Signed)
-----   Message from Alfonso Patten, MD sent at 05/23/2021 12:05 PM EST ----- Iron stores look fine but Vitamin D is low. Recommend 2000 IU vitamin D daily for the next 3 months and plan to recheck levels at follow-up.  MAs please call. Thank you!

## 2021-05-23 NOTE — Telephone Encounter (Signed)
Called pt discussed Iron stores look fine but Vitamin D is low. Recommend 2000 IU vitamin D daily for the next 3 months and plan to recheck levels at follow-up.

## 2021-05-23 NOTE — Telephone Encounter (Signed)
I ordered the mammogram and ultrasound.  She needs to have an in office visit to complete an exam on this after her mammogram.

## 2021-05-25 ENCOUNTER — Encounter: Payer: Self-pay | Admitting: Dermatology

## 2021-06-02 ENCOUNTER — Ambulatory Visit
Admission: RE | Admit: 2021-06-02 | Discharge: 2021-06-02 | Disposition: A | Discharge status: Home / Self Care | Payer: Medicare Other | Source: Ambulatory Visit | Attending: Family Medicine | Admitting: Family Medicine

## 2021-06-02 ENCOUNTER — Other Ambulatory Visit: Payer: Self-pay

## 2021-06-02 DIAGNOSIS — N6459 Other signs and symptoms in breast: Secondary | ICD-10-CM | POA: Insufficient documentation

## 2021-06-02 DIAGNOSIS — Q839 Congenital malformation of breast, unspecified: Secondary | ICD-10-CM | POA: Diagnosis not present

## 2021-06-02 DIAGNOSIS — R922 Inconclusive mammogram: Secondary | ICD-10-CM | POA: Diagnosis not present

## 2021-07-04 ENCOUNTER — Ambulatory Visit: Payer: Medicare Other | Admitting: Family Medicine

## 2021-08-15 ENCOUNTER — Telehealth: Payer: Self-pay | Admitting: Family Medicine

## 2021-08-15 DIAGNOSIS — F4310 Post-traumatic stress disorder, unspecified: Secondary | ICD-10-CM

## 2021-08-15 NOTE — Telephone Encounter (Signed)
Pt need referral tor behavioral health for counseling. Pt stated she has been there before 2 years ago. Pt has an appt for 2/13. Cordelia Bessinger,cma

## 2021-08-15 NOTE — Telephone Encounter (Signed)
Pt need referral tor behavioral health for counseling. Pt stated she has been there before 2 years ago. Pt has an appt for 2/13

## 2021-08-16 NOTE — Telephone Encounter (Signed)
I placed this referral for her.  Please confirm if this was for PTSD or if it is for some other reason.  Thanks.

## 2021-08-16 NOTE — Telephone Encounter (Signed)
I called and spoke with the patient and informed her that the referral for behavioral was done and to confirm that it was for PTSD and it was for PTSD.  [place] Throckmorton,cma

## 2021-08-17 ENCOUNTER — Ambulatory Visit: Payer: Medicare Other | Admitting: Dermatology

## 2021-08-28 ENCOUNTER — Ambulatory Visit: Payer: Medicare Other | Admitting: Family Medicine

## 2021-09-12 ENCOUNTER — Ambulatory Visit (INDEPENDENT_AMBULATORY_CARE_PROVIDER_SITE_OTHER): Payer: Medicare Other | Admitting: Psychologist

## 2021-09-12 DIAGNOSIS — F4310 Post-traumatic stress disorder, unspecified: Secondary | ICD-10-CM

## 2021-09-12 NOTE — Progress Notes (Signed)
                Kaevion Sinclair, PsyD 
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
## 2021-10-04 ENCOUNTER — Ambulatory Visit (INDEPENDENT_AMBULATORY_CARE_PROVIDER_SITE_OTHER): Payer: Medicare Other | Admitting: Family Medicine

## 2021-10-04 ENCOUNTER — Other Ambulatory Visit: Payer: Self-pay

## 2021-10-04 ENCOUNTER — Encounter: Payer: Self-pay | Admitting: Family Medicine

## 2021-10-04 VITALS — BP 120/80 | HR 96 | Temp 97.8°F | Ht 64.0 in | Wt 190.4 lb

## 2021-10-04 DIAGNOSIS — L570 Actinic keratosis: Secondary | ICD-10-CM

## 2021-10-04 DIAGNOSIS — G8929 Other chronic pain: Secondary | ICD-10-CM

## 2021-10-04 DIAGNOSIS — E559 Vitamin D deficiency, unspecified: Secondary | ICD-10-CM | POA: Diagnosis not present

## 2021-10-04 DIAGNOSIS — M545 Low back pain, unspecified: Secondary | ICD-10-CM

## 2021-10-04 DIAGNOSIS — F4310 Post-traumatic stress disorder, unspecified: Secondary | ICD-10-CM

## 2021-10-04 NOTE — Assessment & Plan Note (Signed)
Recheck vitamin D.  We will determine appropriate treatment once her level returns. ?

## 2021-10-04 NOTE — Patient Instructions (Signed)
Nice to see you. ?I placed the psychiatry referral for you. If you do not hear from them in the next 2 weeks please let us know.  ?We will let you know what your vitamin D level is.  ?
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
## 2021-10-04 NOTE — Assessment & Plan Note (Signed)
Exacerbation of a chronic issue.  She will let us know if she does not start to improve and we could consider further evaluation at that time. ?

## 2021-10-04 NOTE — Assessment & Plan Note (Signed)
The patient was prescribed Efudex by dermatology though has not started this yet.  She will start this when she is ready.  She is aware that this could irritate her skin significantly. ?

## 2021-10-04 NOTE — Assessment & Plan Note (Signed)
Chronic ongoing issue in association with depression and anxiety.  Discussed at this point she should see psychiatry.  Referral placed.  She was advised to seek medical attention if she develops suicidal ideation. ?

## 2021-10-05 LAB — VITAMIN D 25 HYDROXY (VIT D DEFICIENCY, FRACTURES): VITD: 17.97 ng/mL — ABNORMAL LOW (ref 30.00–100.00)

## 2021-10-11 ENCOUNTER — Ambulatory Visit (INDEPENDENT_AMBULATORY_CARE_PROVIDER_SITE_OTHER): Payer: Medicare Other

## 2021-10-11 VITALS — Ht 64.0 in | Wt 190.0 lb

## 2021-10-11 DIAGNOSIS — Z0000 Encounter for general adult medical examination without abnormal findings: Secondary | ICD-10-CM

## 2021-10-11 NOTE — Patient Instructions (Addendum)
?  Ms. Chapdelaine , ?Thank you for taking time to come for your Medicare Wellness Visit. I appreciate your ongoing commitment to your health goals. Please review the following plan we discussed and let me know if I can assist you in the future.  ? ?These are the goals we discussed: ? Goals   ? ?  Increase physical activity   ?  Walk more for exercise ?  ? ?  ?  ?This is a list of the screening recommended for you and due dates:  ?Health Maintenance  ?Topic Date Due  ? Cologuard (Stool DNA test)  10/12/2022*  ? Mammogram  06/03/2023  ? DEXA scan (bone density measurement)  Completed  ? Hepatitis C Screening: USPSTF Recommendation to screen - Ages 18-79 yo.  Completed  ? HPV Vaccine  Aged Out  ? Pneumonia Vaccine  Discontinued  ? Flu Shot  Discontinued  ? Tetanus Vaccine  Discontinued  ? COVID-19 Vaccine  Discontinued  ? Zoster (Shingles) Vaccine  Discontinued  ?*Topic was postponed. The date shown is not the original due date.  ?  ?
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
## 2021-10-23 ENCOUNTER — Encounter: Payer: Self-pay | Admitting: Family

## 2021-10-23 NOTE — Progress Notes (Signed)
Unable to contact patient to schedule echocardiogram, letter sent. Order cancelled.

## 2021-10-31 DIAGNOSIS — Z20822 Contact with and (suspected) exposure to covid-19: Secondary | ICD-10-CM | POA: Diagnosis not present

## 2021-11-20 DIAGNOSIS — Z20822 Contact with and (suspected) exposure to covid-19: Secondary | ICD-10-CM | POA: Diagnosis not present

## 2022-01-08 ENCOUNTER — Ambulatory Visit: Payer: Medicare Other | Admitting: Family Medicine

## 2022-02-05 NOTE — Progress Notes (Unsigned)
Cardiology Office Note  Date:  02/06/2022   ID:  Regina Nash 07-05-1951, MRN [id number]  PCP:  Leone Haven, MD   Chief Complaint  Patient presents with   12 month follow up     Patient c/o palpitations and tachycardia. Medications reviewed by the patient verbally.     HPI:  [name] is a 71 year old woman with past medical history of Smoking Hyperlipidemia osa not on CPAP Depression/PTSD Calcium score of 83 performed in December 2019 Who presents for routine follow-up of her palpitations, CT coronary calcium scoring  Last seen by myself in clinic April 2021 Seen by one of our providers April 2022  In follow-up she reports having occasional palpitations Certain activities will increase heart rate Palpitations likely exacerbated by recent home stressors  No regular exercise or activity Smoking less, 5 a day Reports she no longer drinks   Does not want to be on statin Might consider Zetia  Husband with traumatic brain injury 06/2021 High stress  Reports having poor diet, lots of take out No regular exercise program Some dental issues, difficulty eating certain foods  EKG personally reviewed by myself on todays visit Sinus tachycardia rate 102 bpm no significant ST-T wave changes  Lab work reviewed Hemoglobin A1c 6.0 Total cholesterol 222, LDL 142   PMH:   has a past medical history of Allergy, Anxiety, Arrhythmia, Arthritis, Asthma, Chronic bronchitis ([place]), Chronic kidney disease, Depression, Fatty liver, Gallstones, Genital warts, GERD (gastroesophageal reflux disease), History of recurrent UTIs, Hyperglycemia, IBS (irritable bowel syndrome), Migraine, PTSD (post-traumatic stress disorder), Sleep apnea, Substance abuse ([place]), and Urine incontinence.  PSH:    Past Surgical History:  Procedure Laterality Date   TUBAL LIGATION      Current Outpatient Medications  Medication Sig Dispense Refill   aspirin 325 MG tablet Take 325 mg by mouth  daily.     calcium carbonate (TUMS - DOSED IN MG ELEMENTAL CALCIUM) 500 MG chewable tablet Chew 1 tablet by mouth daily.     fluorouracil (EFUDEX) 5 % cream Apply a thin coat to the nose BID x 4 days. 15 g 0   No current facility-administered medications for this visit.    Allergies:   Codeine and Erythromycin   Social History:  The patient  reports that she has been smoking cigarettes. She has been smoking an average of .5 packs per day. She has never used smokeless tobacco. She reports that she does not drink alcohol and does not use drugs.   Family History:   family history includes Alcohol abuse in her father and mother; Alcoholism in an other family member; Anxiety disorder in her mother, sister, and sister; Arthritis in an other family member; Breast cancer in her paternal aunt; Cancer in an other family member; Depression in her sister; Rheum arthritis in her maternal grandfather.    Review of Systems: Review of Systems  Constitutional: Negative.   Respiratory: Negative.    Cardiovascular:  Positive for palpitations.  Gastrointestinal: Negative.   Musculoskeletal: Negative.   Neurological: Negative.   Psychiatric/Behavioral: Negative.    All other systems reviewed and are negative.   PHYSICAL EXAM: VS:  BP 120/80 (BP Location: Left Arm, Patient Position: Sitting, Cuff Size: Normal)   Pulse (!) 102   Ht '5\' 4"'$  (1.626 m)   Wt 184 lb 4 oz (83.6 kg)   LMP 02/16/2010 (Exact Date)   SpO2 98%   BMI 31.63 kg/m  , BMI Body mass index is 31.63 kg/m. Constitutional:  oriented to person, place, and time. No distress.  HENT:  Head: Grossly normal Eyes:  no discharge. No scleral icterus.  Neck: No JVD, no carotid bruits  Cardiovascular: Regular rate and rhythm, no murmurs appreciated Pulmonary/Chest: Clear to auscultation bilaterally, no wheezes or rails Abdominal: Soft.  no distension.  no tenderness.  Musculoskeletal: Normal range of motion Neurological:  normal muscle tone.  Coordination normal. No atrophy Skin: Skin warm and dry Psychiatric: normal affect, pleasant   Recent Labs: 04/12/2021: ALT 35; BUN 13; Creatinine, Ser 0.97; Potassium 4.1; Sodium 139; TSH 2.80    Lipid Panel Lab Results  Component Value Date   CHOL 207 (H) 04/12/2021   HDL 46.10 04/12/2021   LDLCALC 125 (H) 04/12/2021   TRIG 176.0 (H) 04/12/2021      Wt Readings from Last 3 Encounters:  02/06/22 184 lb 4 oz (83.6 kg)  10/11/21 190 lb (86.2 kg)  10/04/21 190 lb 6.4 oz (86.4 kg)     ASSESSMENT AND PLAN:  Paroxysmal tachycardia ([place]) -   exacerbated by anxiety, stress at home She wore a monitor in October 2019, minimal arrhythmia Propranolol 10 mg as needed  Palpitations -  Likely APCs PVCs Recommend she try propranolol 10 mg as needed  Mixed hyperlipidemia  high cholesterol numbers discussed, prefers not to be on a statin Calcium score 80 in 2019  recommend she consider Zetia 10 mg daily, she will call us if she would like to add this  Sleep apnea, unspecified type Managed by primary care Recommended weight loss, walking program  Abnormal electrocardiogram - Minimal coronary calcification  no anginal symptoms Stressed importance of smoking cessation, consider Zetia as she is deferring statin   Total encounter time more than 30 minutes  Greater than 50% was spent in counseling and coordination of care with the patient   No orders of the defined types were placed in this encounter.    Signed, Esmond Plants, M.D., Ph.D. 02/06/2022  [place], [place]

## 2022-02-06 ENCOUNTER — Ambulatory Visit (INDEPENDENT_AMBULATORY_CARE_PROVIDER_SITE_OTHER): Payer: Medicare Other | Admitting: Cardiovascular Disease

## 2022-02-06 ENCOUNTER — Encounter: Payer: Self-pay | Admitting: Cardiovascular Disease

## 2022-02-06 VITALS — BP 120/80 | HR 102 | Ht 64.0 in | Wt 184.2 lb

## 2022-02-06 DIAGNOSIS — I2510 Atherosclerotic heart disease of native coronary artery without angina pectoris: Secondary | ICD-10-CM

## 2022-02-06 DIAGNOSIS — R0609 Other forms of dyspnea: Secondary | ICD-10-CM

## 2022-02-06 DIAGNOSIS — I479 Paroxysmal tachycardia, unspecified: Secondary | ICD-10-CM

## 2022-02-06 DIAGNOSIS — R002 Palpitations: Secondary | ICD-10-CM | POA: Diagnosis not present

## 2022-02-06 DIAGNOSIS — E782 Mixed hyperlipidemia: Secondary | ICD-10-CM | POA: Diagnosis not present

## 2022-02-06 MED ORDER — PROPRANOLOL HCL 10 MG PO TABS: 10.0000 mg | ORAL_TABLET | Freq: Three times a day (TID) | ORAL | 3 refills | Status: DC | PRN

## 2022-02-06 NOTE — Patient Instructions (Addendum)
Medication Instructions:  Propranolol 10 mg up to three times a day as needed for palpiations  If you need a refill on your cardiac medications before your next appointment, please call your pharmacy.   Lab work: No new labs needed  Testing/Procedures: No new testing needed  Follow-Up: At [facility], you and your health needs are our priority.  As part of our continuing mission to provide you with exceptional heart care, we have created designated Provider Care Teams.  These Care Teams include your primary Cardiologist (physician) and Advanced Practice Providers (APPs -  Physician Assistants and Nurse Practitioners) who all work together to provide you with the care you need, when you need it.  You will need a follow up appointment as needed  Providers on your designated Care Team:   Murray Hodgkins, NP Christell Faith, PA-C Cadence Kathlen Mody, [place]  COVID-19 Vaccine Information can be found at: [email or web address] For questions related to vaccine distribution or appointments, please email [email or web address] or call [phone].

## 2022-03-22 DIAGNOSIS — H25813 Combined forms of age-related cataract, bilateral: Secondary | ICD-10-CM | POA: Diagnosis not present

## 2022-03-27 DIAGNOSIS — G4733 Obstructive sleep apnea (adult) (pediatric): Secondary | ICD-10-CM | POA: Diagnosis not present

## 2022-03-27 DIAGNOSIS — R0602 Shortness of breath: Secondary | ICD-10-CM | POA: Diagnosis not present

## 2022-03-28 DIAGNOSIS — R0602 Shortness of breath: Secondary | ICD-10-CM | POA: Diagnosis not present

## 2022-03-28 DIAGNOSIS — G4733 Obstructive sleep apnea (adult) (pediatric): Secondary | ICD-10-CM | POA: Diagnosis not present

## 2022-05-02 ENCOUNTER — Other Ambulatory Visit: Payer: Self-pay | Admitting: Family Medicine

## 2022-05-02 DIAGNOSIS — Z1231 Encounter for screening mammogram for malignant neoplasm of breast: Secondary | ICD-10-CM

## 2022-06-20 ENCOUNTER — Ambulatory Visit
Admission: RE | Admit: 2022-06-20 | Discharge: 2022-06-20 | Disposition: A | Discharge status: Home / Self Care | Payer: Medicare Other | Source: Ambulatory Visit | Attending: Family Medicine | Admitting: Family Medicine

## 2022-06-20 DIAGNOSIS — Z1231 Encounter for screening mammogram for malignant neoplasm of breast: Secondary | ICD-10-CM | POA: Insufficient documentation

## 2022-09-20 DIAGNOSIS — H25042 Posterior subcapsular polar age-related cataract, left eye: Secondary | ICD-10-CM | POA: Diagnosis not present

## 2022-09-24 IMAGING — MG DIGITAL DIAGNOSTIC BILAT W/ TOMO W/ CAD
6 of 10 series · 6 of 30 positions shown · non-contrast
Comparison: Previous exam(s).

CLINICAL DATA: Patient presents with bilateral nipple itching,
which has improved since she stopped using a baby [place], Ry
solution combination. No reported breast lumps. No nipple discharge.

EXAM:
DIGITAL DIAGNOSTIC BILATERAL MAMMOGRAM WITH TOMOSYNTHESIS AND CAD
TECHNIQUE: Bilateral digital diagnostic mammography and breast tomosynthesis
was performed. The images were evaluated with computer-aided
detection.

[R MLO synth-2D (1 of 2)]
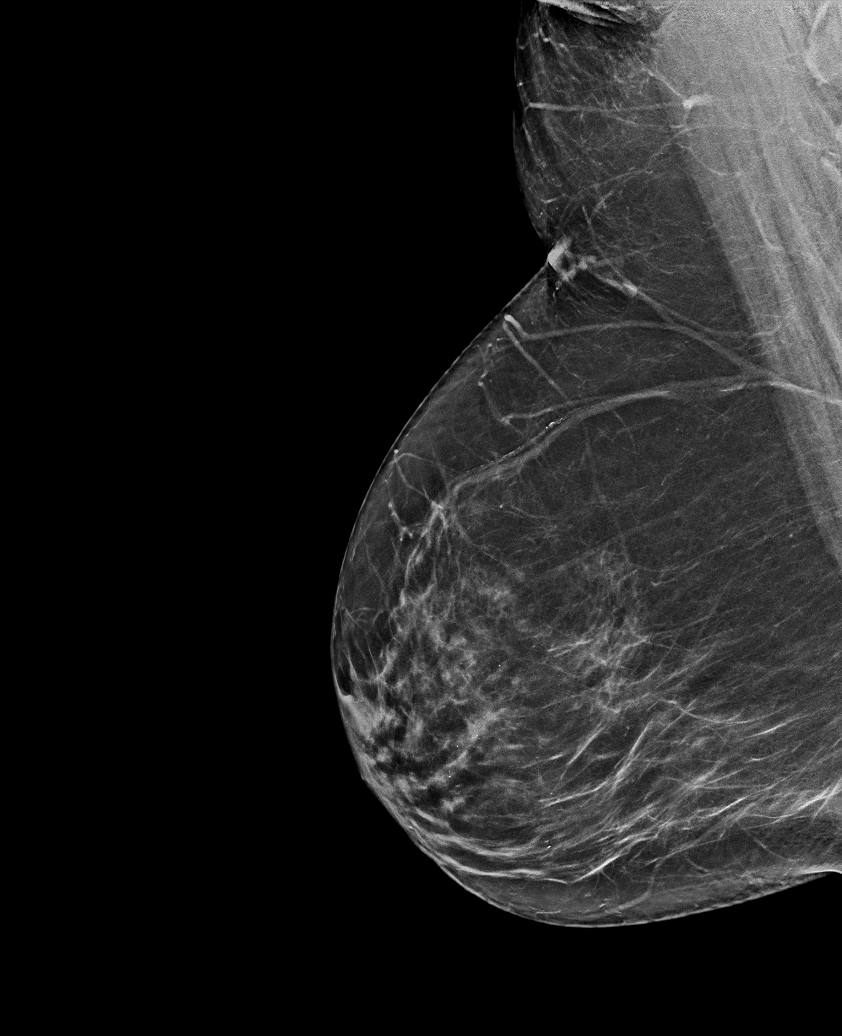

[L MLO synth-2D]
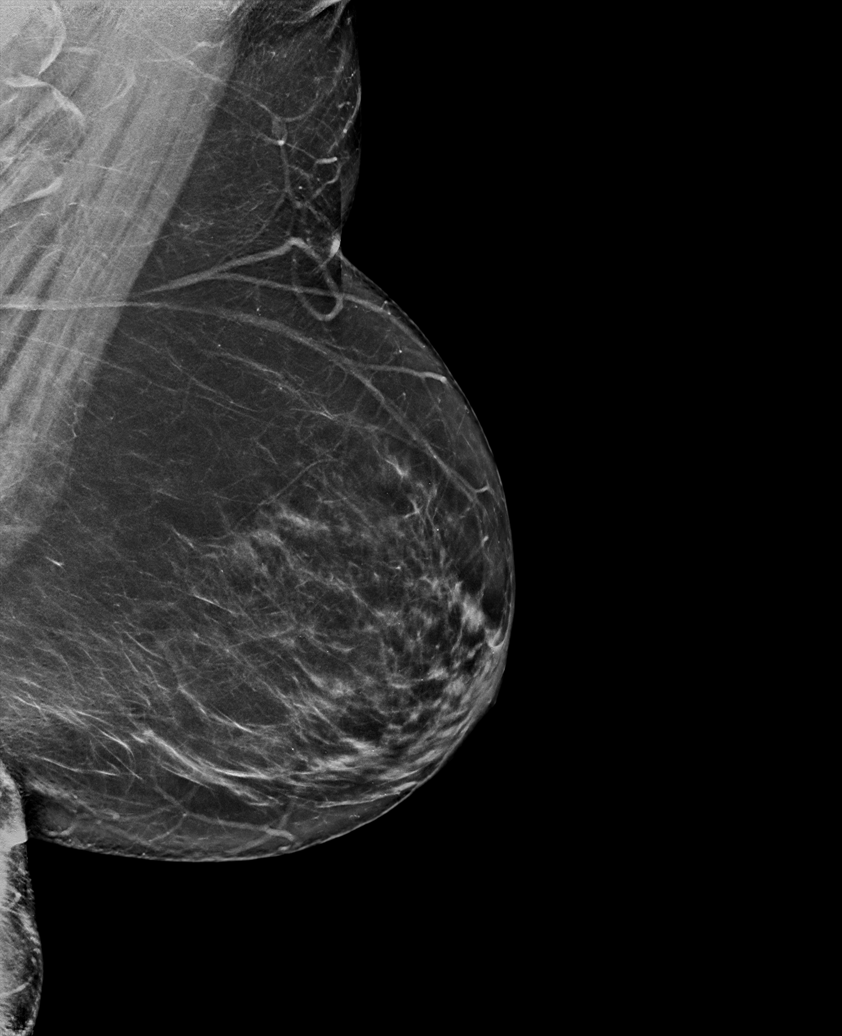

[R CC synth-2D]
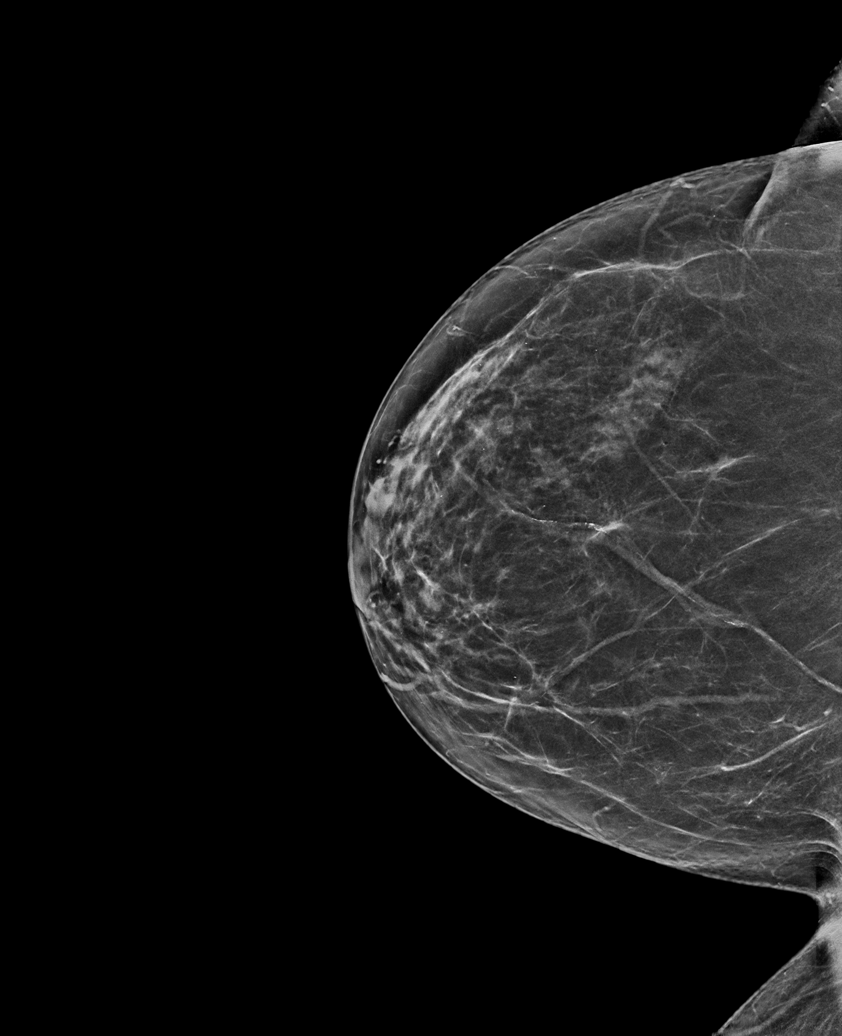

[L CC synth-2D]
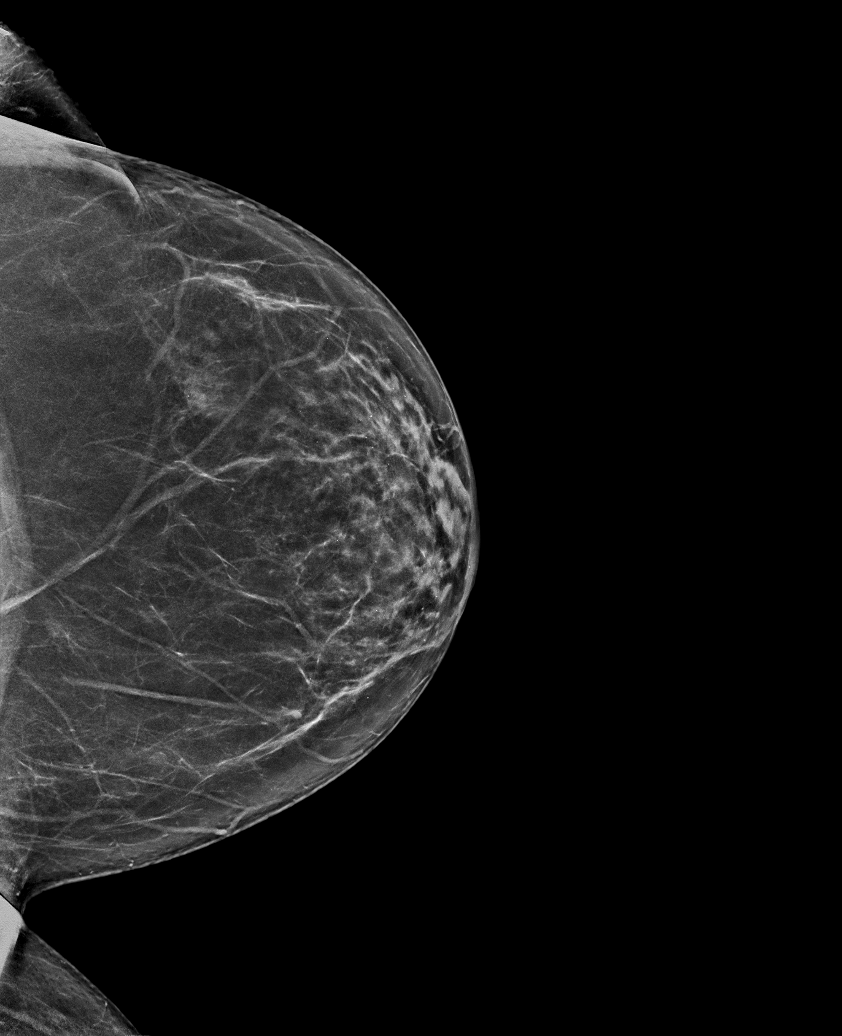

[R MLO synth-2D (2 of 2)]
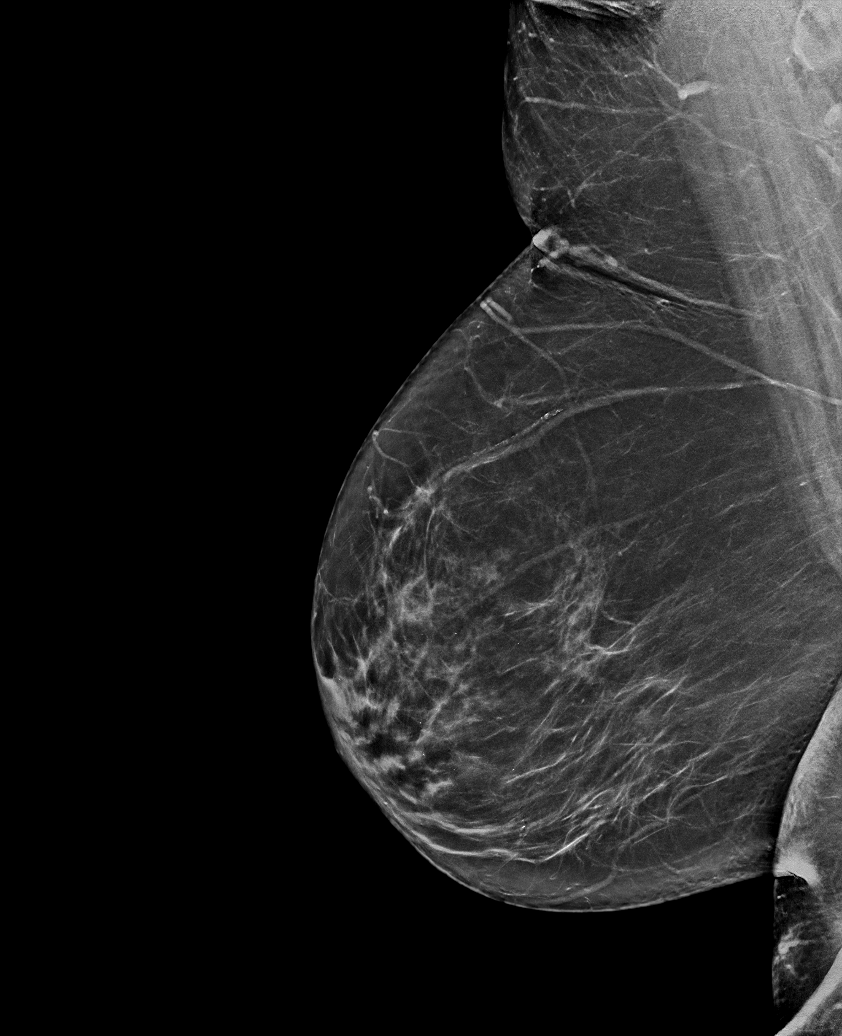

[L MLO tomo · tomo slice 38/75.0]
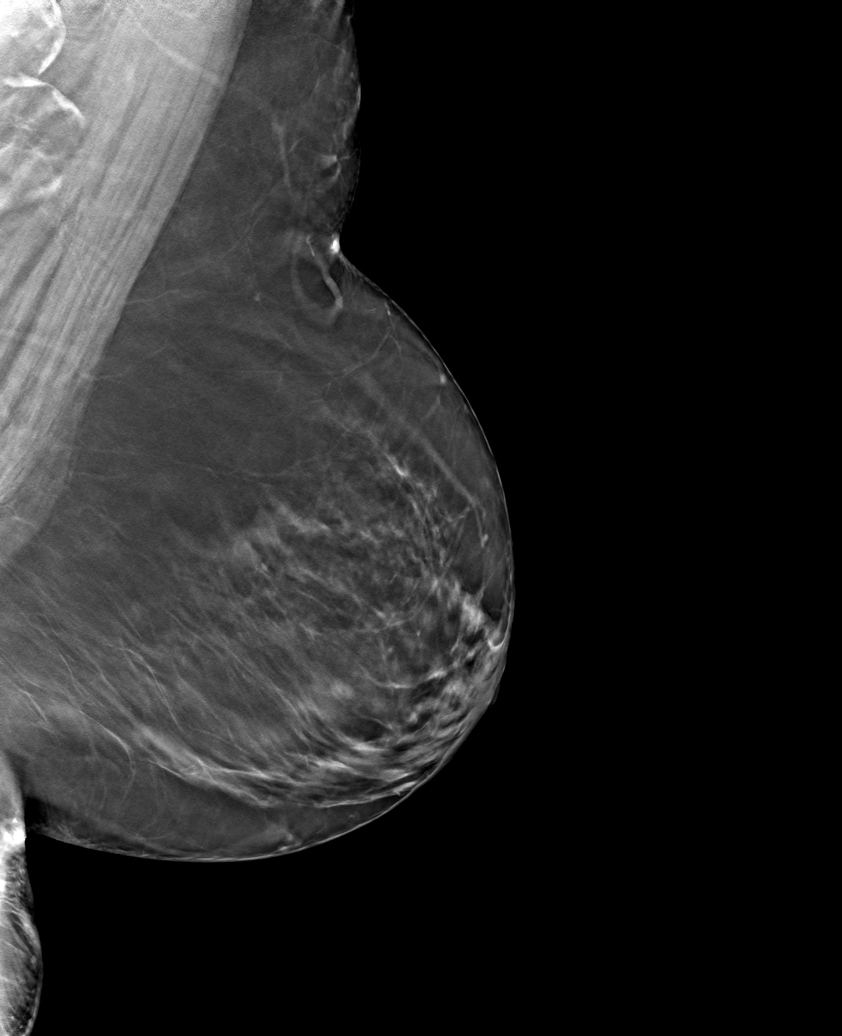

[6 of 30 positions shown; findings below may reference images not displayed]

ACR Breast Density Category b: There are scattered areas of
fibroglandular density.
FINDINGS: There are no masses, areas of architectural distortion, areas of
significant asymmetry or suspicious calcifications.
IMPRESSION: No evidence of breast malignancy.

RECOMMENDATION:
Screening mammogram in one year.(Code:DW-P-NET)

I have discussed the findings and recommendations with the patient.
If applicable, a reminder letter will be sent to the patient
regarding the next appointment.

BI-RADS CATEGORY  1: Negative.

## 2022-12-05 ENCOUNTER — Other Ambulatory Visit: Payer: Self-pay | Admitting: Dermatology

## 2022-12-05 ENCOUNTER — Ambulatory Visit (INDEPENDENT_AMBULATORY_CARE_PROVIDER_SITE_OTHER): Payer: Medicare Other | Admitting: Dermatology

## 2022-12-05 ENCOUNTER — Encounter: Payer: Self-pay | Admitting: Dermatology

## 2022-12-05 DIAGNOSIS — L578 Other skin changes due to chronic exposure to nonionizing radiation: Secondary | ICD-10-CM | POA: Diagnosis not present

## 2022-12-05 DIAGNOSIS — Z5111 Encounter for antineoplastic chemotherapy: Secondary | ICD-10-CM

## 2022-12-05 DIAGNOSIS — L821 Other seborrheic keratosis: Secondary | ICD-10-CM

## 2022-12-05 DIAGNOSIS — D1801 Hemangioma of skin and subcutaneous tissue: Secondary | ICD-10-CM

## 2022-12-05 DIAGNOSIS — L304 Erythema intertrigo: Secondary | ICD-10-CM

## 2022-12-05 DIAGNOSIS — D485 Neoplasm of uncertain behavior of skin: Secondary | ICD-10-CM

## 2022-12-05 DIAGNOSIS — Z1283 Encounter for screening for malignant neoplasm of skin: Secondary | ICD-10-CM | POA: Diagnosis not present

## 2022-12-05 DIAGNOSIS — L570 Actinic keratosis: Secondary | ICD-10-CM

## 2022-12-05 DIAGNOSIS — B078 Other viral warts: Secondary | ICD-10-CM

## 2022-12-05 DIAGNOSIS — X32XXXA Exposure to sunlight, initial encounter: Secondary | ICD-10-CM

## 2022-12-05 DIAGNOSIS — L814 Other melanin hyperpigmentation: Secondary | ICD-10-CM

## 2022-12-05 DIAGNOSIS — L820 Inflamed seborrheic keratosis: Secondary | ICD-10-CM | POA: Diagnosis not present

## 2022-12-05 DIAGNOSIS — W908XXA Exposure to other nonionizing radiation, initial encounter: Secondary | ICD-10-CM

## 2022-12-05 MED ORDER — HYDROCORTISONE 2.5 % EX CREA: TOPICAL_CREAM | CUTANEOUS | 1 refills | Status: DC

## 2022-12-05 MED ORDER — FLUOROURACIL 5 % EX CREA: TOPICAL_CREAM | CUTANEOUS | 0 refills | Status: DC

## 2022-12-05 MED ORDER — KETOCONAZOLE 2 % EX CREA: TOPICAL_CREAM | CUTANEOUS | 2 refills | Status: DC

## 2022-12-05 NOTE — Progress Notes (Signed)
Follow-Up Visit   Subjective  Regina Nash is a 72 y.o. female who presents for the following: Skin Cancer Screening and Full Body Skin Exam  The patient presents for Total-Body Skin Exam (TBSE) for skin cancer screening and mole check. The patient has spots, moles and lesions to be evaluated, some may be new or changing and the patient has concerns that these could be cancer.  Patient was seen about 1 and 1/2 years ago and was prescribed 5FU/calcipotriene but never used it. Her husband was in an accident and has been in the hospital since 06/2021 with traumatic brain injury. Patient does have a spot at right cheek that she would like removed. Also with a spot at right axilla that itches and sometimes hurts. A few places at legs that she wants checked.   The following portions of the chart were reviewed this encounter and updated as appropriate: medications, allergies, medical history  Review of Systems:  No other skin or systemic complaints except as noted in HPI or Assessment and Plan.  Objective  Well appearing patient in no apparent distress; mood and affect are within normal limits.  A full examination was performed including scalp, head, eyes, ears, nose, lips, neck, chest, axillae, abdomen, back, buttocks, bilateral upper extremities, bilateral lower extremities, hands, feet, fingers, toes, fingernails, and toenails. All findings within normal limits unless otherwise noted below.   Relevant physical exam findings are noted in the Assessment and Plan.  Nose Erythematous thin papules/macules with gritty scale.   right cheek x 1, L sup shoulder x 1, R lateral breast x 1, L popliteal fossa x 2 (5) Erythematous stuck-on, waxy papule or plaque  left lateral calf 1.0 cm cutaneous horn R/o SCC vs wart vs SK          Assessment & Plan   LENTIGINES, SEBORRHEIC KERATOSES, HEMANGIOMAS - Benign normal skin lesions - Benign-appearing - Call for any changes  MELANOCYTIC NEVI -  Tan-brown and/or pink-flesh-colored symmetric macules and papules - Benign appearing on exam today - Observation - Call clinic for new or changing moles - Recommend daily use of broad spectrum spf 30+ sunscreen to sun-exposed areas.   ACTINIC DAMAGE - Chronic condition, secondary to cumulative UV/sun exposure - diffuse scaly erythematous macules with underlying dyspigmentation - Recommend daily broad spectrum sunscreen SPF 30+ to sun-exposed areas, reapply every 2 hours as needed.  - Staying in the shade or wearing long sleeves, sun glasses (UVA+UVB protection) and wide brim hats (4-inch brim around the entire circumference of the hat) are also recommended for sun protection.  - Call for new or changing lesions.  SKIN CANCER SCREENING PERFORMED TODAY.  AK (actinic keratosis) Nose  Actinic keratoses are precancerous spots that appear secondary to cumulative UV radiation exposure/sun exposure over time. They are chronic with expected duration over 1 year. A portion of actinic keratoses will progress to squamous cell carcinoma of the skin. It is not possible to reliably predict which spots will progress to skin cancer and so treatment is recommended to prevent development of skin cancer.  Recommend daily broad spectrum sunscreen SPF 30+ to sun-exposed areas, reapply every 2 hours as needed.  Recommend staying in the shade or wearing long sleeves, sun glasses (UVA+UVB protection) and wide brim hats (4-inch brim around the entire circumference of the hat). Call for new or changing lesions.  - Start 5-fluorouracil cream twice a day for 7 days to affected areas including nose.  Reviewed course of treatment and expected reaction.  Patient advised to expect inflammation and crusting and advised that erosions are possible.  Patient advised to be diligent with sun protection during and after treatment. Handout with details of how to apply medication and what to expect provided. Counseled to keep  medication out of reach of children and pets.  Inflamed seborrheic keratosis (5) right cheek x 1, L sup shoulder x 1, R lateral breast x 1, L popliteal fossa x 2  With wart  Symptomatic, irritating, patient would like treated.  Benign-appearing.  Call clinic for new or changing lesions.   Prior to procedure, discussed risks of blister formation, small wound, skin dyspigmentation, or rare scar following treatment. Recommend Vaseline ointment to treated areas while healing.    Destruction of lesion - right cheek x 1, L sup shoulder x 1, R lateral breast x 1, L popliteal fossa x 2  Destruction method: cryotherapy   Informed consent: discussed and consent obtained   Lesion destroyed using liquid nitrogen: Yes   Cryotherapy cycles:  2 Outcome: patient tolerated procedure well with no complications   Post-procedure details: wound care instructions given    Neoplasm of uncertain behavior of skin left lateral calf  Epidermal / dermal shaving  Lesion diameter (cm):  1 Informed consent: discussed and consent obtained   Timeout: patient name, date of birth, surgical site, and procedure verified   Anesthesia: the lesion was anesthetized in a standard fashion   Anesthetic:  1% lidocaine w/ epinephrine 1-100,000 local infiltration (and topical ethyl chloride) Instrument used: flexible razor blade   Hemostasis achieved with: aluminum chloride   Outcome: patient tolerated procedure well   Post-procedure details: wound care instructions given   Additional details:  Mupirocin and a bandage applied  Destruction of lesion  Destruction method: electrodesiccation and curettage   Informed consent: discussed and consent obtained   Timeout:  patient name, date of birth, surgical site, and procedure verified Anesthesia: the lesion was anesthetized in a standard fashion   Anesthetic:  1% lidocaine w/ epinephrine 1-100,000 buffered w/ 8.4% NaHCO3 Curettage performed in three different directions:  Yes   Electrodesiccation performed over the curetted area: Yes   Curettage cycles:  3 Final wound size (cm):  1 Hemostasis achieved with:  electrodesiccation Outcome: patient tolerated procedure well with no complications   Post-procedure details: sterile dressing applied and wound care instructions given   Dressing type: petrolatum    Specimen 1 - Surgical pathology Differential Diagnosis: R/o SCC vs wart vs SK  Check Margins: No 1.0 cm cutaneous horn   Erythema intertrigo  Actinic skin damage  Related Medications fluorouracil (EFUDEX) 5 % cream Apply a thin coat to the nose BID x 4 days.  Encounter for chemotherapy management  Other viral warts  WART Exam: verrucous papule(s) at left wrist favored > AK  Counseling Discussed viral / HPV (Human Papilloma Virus) etiology and risk of spread /infectivity to other areas of body as well as to other people.  Multiple treatments and methods may be required to clear warts and it is possible treatment may not be successful.  Treatment risks include discoloration; scarring and there is still potential for wart recurrence.  Treatment Plan: Destruction Procedure Note Destruction method: cryotherapy   Informed consent: discussed and consent obtained   Lesion destroyed using liquid nitrogen: Yes   Outcome: patient tolerated procedure well with no complications   Post-procedure details: wound care instructions given   Locations: left wrist # of Lesions Treated: 1  Prior to procedure, discussed risks of blister  formation, small wound, skin dyspigmentation, or rare scar following cryotherapy. Recommend Vaseline ointment to treated areas while healing.  INTERTRIGO Exam Erythematous macerated patches  Chronic and persistent condition with duration or expected duration over one year. Condition is symptomatic/ bothersome to patient. Not currently at goal.  Intertrigo is a chronic recurrent rash that occurs in skin fold areas that may  be associated with friction; heat; moisture; yeast; fungus; and bacteria.  It is exacerbated by increased movement / activity; sweating; and higher atmospheric temperature.  Treatment Plan Start ketoconazole cream twice a day as needed for rash.  Start hydrocortisone 2.5% cream twice a day for up to 2 weeks as needed for rash.   Topical steroids (such as triamcinolone, fluocinolone, fluocinonide, mometasone, clobetasol, halobetasol, betamethasone, hydrocortisone) can cause thinning and lightening of the skin if they are used for too long in the same area. Your physician has selected the right strength medicine for your problem and area affected on the body. Please use your medication only as directed by your physician to prevent side effects.   Start Zeasorb AF powder or other OTC antifungal powder to the area daily to prevent rash recurrence. Other options to help keep the area dry include blow drying the area after bathing or using antiperspirant products such as Duradry to help keep the area dry.    Return in about 1 year (around 12/05/2023) for TBSE, AK follow up.  Anise Salvo, RMA, am acting as scribe for Darden Dates, MD .   Documentation: I have reviewed the above documentation for accuracy and completeness, and I agree with the above.  Darden Dates, MD

## 2022-12-05 NOTE — Patient Instructions (Addendum)
Cryotherapy Aftercare  Wash gently with soap and water everyday.   Apply Vaseline and Band-Aid daily until healed.   Recommend OTC Gold Bond Rapid Relief Anti-Itch cream (pramoxine + menthol), CeraVe Anti-itch cream or lotion (pramoxine), Sarna lotion (Original- menthol + camphor or Sensitive- pramoxine) or Eucerin 12 hour Itch Relief lotion (menthol) up to 3 times per day to areas on body that are itchy.   Gentle Skin Care Guide  1. Bathe no more than once a day.  2. Avoid bathing in hot water  3. Use a mild soap like Dove, Vanicream, Cetaphil, CeraVe. Can use Lever 2000 or Cetaphil antibacterial soap  4. Use soap only where you need it. On most days, use it under your arms, between your legs, and on your feet. Let the water rinse other areas unless visibly dirty.  5. When you get out of the bath/shower, use a towel to gently blot your skin dry, don't rub it.  6. While your skin is still a little damp, apply a moisturizing cream such as Vanicream, CeraVe, Cetaphil, Eucerin, Sarna lotion or plain Vaseline Jelly. For hands apply Neutrogena [place] Hand Cream or Excipial Hand Cream.  7. Reapply moisturizer any time you start to itch or feel dry.  8. Sometimes using free and clear laundry detergents can be helpful. Fabric softener sheets should be avoided. Downy Free & Gentle liquid, or any liquid fabric softener that is free of dyes and perfumes, it acceptable to use  9. If your doctor has given you prescription creams you may apply moisturizers over them     - Start 5-fluorouracil cream twice a day for 7 days to affected areas including nose.  Reviewed course of treatment and expected reaction.  Patient advised to expect inflammation and crusting and advised that erosions are possible.  Patient advised to be diligent with sun protection during and after treatment. Handout with details of how to apply medication and what to expect provided. Counseled to keep medication out of reach of  children and pets.  5-Fluorouracil Patient Education   Actinic keratoses are the dry, red scaly spots on the skin caused by sun damage. A portion of these spots can turn into skin cancer with time, and treating them can help prevent development of skin cancer.   Treatment of these spots requires removal of the defective skin cells. There are various ways to remove actinic keratoses, including freezing with liquid nitrogen, treatment with creams, or treatment with a blue light procedure in the office.   5-fluorouracil cream is a topical cream used to treat actinic keratoses. It works by interfering with the growth of abnormal fast-growing skin cells, such as actinic keratoses. These cells peel off and are replaced by healthy ones. THIS CREAM SHOULD BE KEPT OUT OF REACH OF CHILDREN AND PETS AND SHOULD NOT BE USED BY PREGNANT WOMEN.  INSTRUCTIONS FOR 5-FLUOROURACIL CREAM:   5-fluorouracil cream typically needs to be used for 7-14 days depending on the location of treatment. A thin layer should be applied twice a day to the treatment areas recommended by your physician.    Avoid contact with your eyes or nostrils. Avoid applying the cream to your eyelids or lips unless directed to apply there by your physician. Do not use 5-fluorouracil on infected or open wounds.   You will develop redness, irritation and some crusting at areas where you have pre-cancer damage/actinic keratoses. IF YOU DEVELOP PAIN, BLEEDING, OR SIGNIFICANT CRUSTING, STOP THE TREATMENT EARLY - you have already gotten a  good response and the actinic keratoses should clear up well.  Wash your hands after applying 5-fluorouracil 5% cream on your skin.   A moisturizer or sunscreen with a minimum SPF 30 should be applied each morning.   Once you have finished the treatment, you can apply a thin layer of Vaseline twice a day to irritated areas to soothe and calm the areas more quickly. If you experience significant discomfort, contact  your physician.  For some patients it is necessary to repeat the treatment for best results.  SIDE EFFECTS: When using 5-fluorouracil cream, you may have mild irritation, such as redness, dryness, swelling, or a mild burning sensation. This usually resolves within 2 weeks. The more actinic keratoses you have, the more redness and inflammation you can expect during treatment. Eye irritation has been reported rarely. If this occurs, please let us know.   If you have any trouble using this cream, please send [place] a MyChart Message or call the office. If you have any other questions about this information, please do not hesitate to ask me before you leave the office or reach out on MyChart or by phone.  Your prescription was sent to [facility] in [place]. A representative from [employer] will contact you within 2 business hours to verify your address and insurance information to schedule a free delivery. If for any reason you do not receive a phone call from them, please reach out to them. Their phone number is [phone] and their hours are Monday-Friday 9:00 am-5:00 pm.    For Intertrigo (Rash at folds) - Start Ketoconazole 2% cream twice a day to affected areas when you have the rash - Start Hydrocortisone 2.5% cream/lotion twice a day to affected areas for up to 2 weeks when you have the rash. This can thin and lighten the area and cause stretch marks if it is used too often, so only use it for short periods when you have the rash. - Recommend antifungal powder such as Zeasorb AF daily for prevention  Recommend OTC Zeasorb AF powder to body folds daily after shower.  It is often found in the athlete's foot section in the pharmacy.  Avoid using powders that contain cornstarch.  Wound Care Instructions  Cleanse wound gently with soap and water once a day then pat dry with clean gauze. Apply a thin coat of Petrolatum (petroleum jelly, "Vaseline") over the wound (unless you have an  allergy to this). We recommend that you use a new, sterile tube of Vaseline. Do not pick or remove scabs. Do not remove the yellow or white "healing tissue" from the base of the wound.  Cover the wound with fresh, clean, nonstick gauze and secure with paper tape. You may use Band-Aids in place of gauze and tape if the wound is small enough, but would recommend trimming much of the tape off as there is often too much. Sometimes Band-Aids can irritate the skin.  You should call the office for your biopsy report after 1 week if you have not already been contacted.  If you experience any problems, such as abnormal amounts of bleeding, swelling, significant bruising, significant pain, or evidence of infection, please call the office immediately.  FOR ADULT SURGERY PATIENTS: If you need something for pain relief you may take 1 extra strength Tylenol (acetaminophen) AND 2 Ibuprofen (200mg  each) together every 4 hours as needed for pain. (do not take these if you are allergic to them or if you have a reason you should  not take them.) Typically, you may only need pain medication for 1 to 3 days.   Melanoma ABCDEs  Melanoma is the most dangerous type of skin cancer, and is the leading cause of death from skin disease.  You are more likely to develop melanoma if you: Have light-colored skin, light-colored eyes, or red or blond hair Spend a lot of time in the sun Tan regularly, either outdoors or in a tanning bed Have had blistering sunburns, especially during childhood Have a close family member who has had a melanoma Have atypical moles or large birthmarks  Early detection of melanoma is key since treatment is typically straightforward and cure rates are extremely high if we catch it early.   The first sign of melanoma is often a change in a mole or a new dark spot.  The ABCDE system is a way of remembering the signs of melanoma.  A for asymmetry:  The two halves do not match. B for border:  The edges  of the growth are irregular. C for color:  A mixture of colors are present instead of an even brown color. D for diameter:  Melanomas are usually (but not always) greater than 6mm - the size of a pencil eraser. E for evolution:  The spot keeps changing in size, shape, and color.  Please check your skin once per month between visits. You can use a small mirror in front and a large mirror behind you to keep an eye on the back side or your body.   If you see any new or changing lesions before your next follow-up, please call to schedule a visit.  Please continue daily skin protection including broad spectrum sunscreen SPF 30+ to sun-exposed areas, reapplying every 2 hours as needed when you're outdoors.    Due to recent changes in healthcare laws, you may see results of your pathology and/or laboratory studies on MyChart before the doctors have had a chance to review them. We understand that in some cases there may be results that are confusing or concerning to you. Please understand that not all results are received at the same time and often the doctors may need to interpret multiple results in order to provide you with the best plan of care or course of treatment. Therefore, we ask that you please give [place] 2 business days to thoroughly review all your results before contacting the office for clarification. Should we see a critical lab result, you will be contacted sooner.   If You Need Anything After Your Visit  If you have any questions or concerns for your doctor, please call our main line at [phone] and press option 4 to reach your doctor's medical assistant. If no one answers, please leave a voicemail as directed and we will return your call as soon as possible. Messages left after 4 pm will be answered the following business day.   You may also send [place] a message via MyChart. We typically respond to MyChart messages within 1-2 business days.  For prescription refills, please ask your  pharmacy to contact our office. Our fax number is [phone].  If you have an urgent issue when the clinic is closed that cannot wait until the next business day, you can page your doctor at the number below.    Please note that while we do our best to be available for urgent issues outside of office hours, we are not available 24/7.   If you have an urgent issue and are unable  to reach [place], you may choose to seek medical care at your doctor's office, retail clinic, urgent care center, or emergency room.  If you have a medical emergency, please immediately call 911 or go to the emergency department.  Pager Numbers  - Dr. Gwen Pounds: [phone]  - Dr. Neale Burly: [phone]  - Dr. Roseanne Reno: [phone]  In the event of inclement weather, please call our main line at [phone] for an update on the status of any delays or closures.  Dermatology Medication Tips: Please keep the boxes that topical medications come in in order to help keep track of the instructions about where and how to use these. Pharmacies typically print the medication instructions only on the boxes and not directly on the medication tubes.   If your medication is too expensive, please contact our office at [phone] option 4 or send [place] a message through MyChart.   We are unable to tell what your co-pay for medications will be in advance as this is different depending on your insurance coverage. However, we may be able to find a substitute medication at lower cost or fill out paperwork to get insurance to cover a needed medication.   If a prior authorization is required to get your medication covered by your insurance company, please allow [place] 1-2 business days to complete this process.  Drug prices often vary depending on where the prescription is filled and some pharmacies may offer cheaper prices.  The website www.goodrx.com contains coupons for medications through different pharmacies. The prices here do not  account for what the cost may be with help from insurance (it may be cheaper with your insurance), but the website can give you the price if you did not use any insurance.  - You can print the associated coupon and take it with your prescription to the pharmacy.  - You may also stop by our office during regular business hours and pick up a GoodRx coupon card.  - If you need your prescription sent electronically to a different pharmacy, notify our office through [facility] or by phone at [phone] option 4.

## 2022-12-08 ENCOUNTER — Emergency Department: Payer: Medicare Other

## 2022-12-08 ENCOUNTER — Other Ambulatory Visit: Payer: Self-pay

## 2022-12-08 ENCOUNTER — Emergency Department
Admission: EM | Admit: 2022-12-08 | Discharge: 2022-12-08 | Disposition: A | Discharge status: Home / Self Care | Payer: Medicare Other | Attending: Emergency Medicine | Admitting: Emergency Medicine

## 2022-12-08 DIAGNOSIS — R1010 Upper abdominal pain, unspecified: Secondary | ICD-10-CM | POA: Diagnosis not present

## 2022-12-08 DIAGNOSIS — R1013 Epigastric pain: Secondary | ICD-10-CM

## 2022-12-08 DIAGNOSIS — K8020 Calculus of gallbladder without cholecystitis without obstruction: Secondary | ICD-10-CM | POA: Diagnosis not present

## 2022-12-08 DIAGNOSIS — R1084 Generalized abdominal pain: Secondary | ICD-10-CM | POA: Diagnosis not present

## 2022-12-08 DIAGNOSIS — R0902 Hypoxemia: Secondary | ICD-10-CM | POA: Diagnosis not present

## 2022-12-08 DIAGNOSIS — K760 Fatty (change of) liver, not elsewhere classified: Secondary | ICD-10-CM | POA: Diagnosis not present

## 2022-12-08 LAB — CBC
HCT: 44.2 % (ref 36.0–46.0)
Hemoglobin: 14.6 g/dL (ref 12.0–15.0)
MCH: 31.1 pg (ref 26.0–34.0)
MCHC: 33 g/dL (ref 30.0–36.0)
MCV: 94.2 fL (ref 80.0–100.0)
Platelets: 223 10*3/uL (ref 150–400)
RBC: 4.69 MIL/uL (ref 3.87–5.11)
RDW: 12 % (ref 11.5–15.5)
WBC: 8 10*3/uL (ref 4.0–10.5)
nRBC: 0 % (ref 0.0–0.2)

## 2022-12-08 LAB — COMPREHENSIVE METABOLIC PANEL
ALT: 24 U/L (ref 0–44)
AST: 26 U/L (ref 15–41)
Albumin: 4 g/dL (ref 3.5–5.0)
Alkaline Phosphatase: 60 U/L (ref 38–126)
Anion gap: 6 (ref 5–15)
BUN: 11 mg/dL (ref 8–23)
CO2: 28 mmol/L (ref 22–32)
Calcium: 9.1 mg/dL (ref 8.9–10.3)
Chloride: 103 mmol/L (ref 98–111)
Creatinine, Ser: 0.93 mg/dL (ref 0.44–1.00)
GFR, Estimated: >60 mL/min (ref >60)
Glucose, Bld: 121 mg/dL — ABNORMAL HIGH (ref 70–99)
Potassium: 3.8 mmol/L (ref 3.5–5.1)
Sodium: 137 mmol/L (ref 135–145)
Total Bilirubin: 0.8 mg/dL (ref 0.3–1.2)
Total Protein: 7.1 g/dL (ref 6.5–8.1)

## 2022-12-08 LAB — TROPONIN I (HIGH SENSITIVITY): Troponin I (High Sensitivity): 2 ng/L (ref <18)

## 2022-12-08 LAB — LIPASE, BLOOD: Lipase: 45 U/L (ref 11–51)

## 2022-12-08 NOTE — ED Provider Notes (Signed)
[facility] Provider Note    Event Date/Time   First MD Initiated Contact with Patient 12/08/22 1634     (approximate)   History   Abdominal Pain   HPI  Regina Nash is a 72 y.o. female who presents with complaints of upper abdominal pain.  Patient reports that started shortly after eating a hard-boiled egg.  She reports she has a history of gallstones.  But reports the pain is primary epigastric.  No chest pain.  No back pain.  She reports symptoms have essentially resolved at this time     Physical Exam   Triage Vital Signs: ED Triage Vitals  Enc Vitals Group     BP 12/08/22 1641 129/76     Pulse Rate 12/08/22 1641 70     Resp 12/08/22 1641 16     Temp 12/08/22 1641 98 F (36.7 C)     Temp src --      SpO2 12/08/22 1641 96 %     Weight 12/08/22 1630 83.5 kg (184 lb)     Height 12/08/22 1630 1.626 m (5\' 4" )     Head Circumference --      Peak Flow --      Pain Score 12/08/22 1630 1     Pain Loc --      Pain Edu? --      Excl. in GC? --     Most recent vital signs: Vitals:   12/08/22 1641  BP: 129/76  Pulse: 70  Resp: 16  Temp: 98 F (36.7 C)  SpO2: 96%     General: Awake, no distress.  CV:  Good peripheral perfusion.  Resp:  Normal effort.  Abd:  No distention.  Mild right upper quadrant tenderness to palpation Other:     ED Results / Procedures / Treatments   Labs (all labs ordered are listed, but only abnormal results are displayed) Labs Reviewed  COMPREHENSIVE METABOLIC PANEL - Abnormal; Notable for the following components:      Result Value   Glucose, Bld 121 (*)    All other components within normal limits  CBC  LIPASE, BLOOD  TROPONIN I (HIGH SENSITIVITY)     EKG   RADIOLOGY Ultrasound demonstrates cholelithiasis, no evidence of cholecystitis    PROCEDURES:  Critical Care performed:   Procedures   MEDICATIONS ORDERED IN ED: Medications - No data to display   IMPRESSION / MDM / ASSESSMENT  AND PLAN / ED COURSE  I reviewed the triage vital signs and the nursing notes. Patient's presentation is most consistent with acute presentation with potential threat to life or bodily function.  Patient presents with abdominal pain as detailed above, differential includes cholecystitis, gastritis, biliary colic, less likely ACS  Will obtain labs including troponin, EKG, ultrasound of the right upper quadrant  Patient's lab work reviewed and is quite reassuring, high sensitive troponin is unremarkable, LFTs are normal, lipase is normal, white blood cell count kidney function normal.  Ultrasound demonstrates cholelithiasis but no evidence of cholecystitis.  Patient remains comfortable and well-appearing, no indication for admission at this time, appropriate for discharge.  Have recommended 2-week course of Prilosec, follow-up with GI and general surgery return precautions discussed      FINAL CLINICAL IMPRESSION(S) / ED DIAGNOSES   Final diagnoses:  Epigastric pain     Rx / DC Orders   ED Discharge Orders     None        Note:  This document was prepared  using Conservation officer, historic buildings and may include unintentional dictation errors.   Jene Every, MD 12/08/22 [phone]

## 2022-12-08 NOTE — ED Triage Notes (Signed)
Per EMS report, patient c/o severe upper abdominal pain that began approximately prior to calling EMS. Patient states she became sweaty and dizzy with the severe pain and had two normal BMs prior to transport. Patient states she has a history of IBBS and has gall stones.  130/70 98% RA 75 pulse CBG 116 Sinus Rhythm on monitor

## 2022-12-09 ENCOUNTER — Other Ambulatory Visit: Payer: Self-pay | Admitting: Dermatology

## 2022-12-12 ENCOUNTER — Telehealth: Payer: Self-pay | Admitting: *Deleted

## 2022-12-12 NOTE — Transitions of Care (Post Inpatient/ED Visit) (Signed)
12/12/2022  Name: Regina Nash MRN: [id number] DOB: 1950-07-22  Today's TOC FU Call Status: Today's TOC FU Call Status:: Successful TOC FU Call Competed TOC FU Call Complete Date: 12/12/22  Transition Care Management Follow-up Telephone Call Date of Discharge: 12/09/22 Discharge Facility: [facility]) Type of Discharge: Emergency Department Reason for ED Visit: Other: (epigastric pain) How have you been since you were released from the hospital?: Better Any questions or concerns?: Yes Patient Questions/Concerns:: Has CPAP and not using. Is it ok to eat Avacados Patient Questions/Concerns Addressed: Other:, Notified Provider of Patient Questions/Concerns  Items Reviewed: Did you receive and understand the discharge instructions provided?: Yes Medications obtained,verified, and reconciled?: Yes (Medications Reviewed) Any new allergies since your discharge?: No Dietary orders reviewed?: Yes Type of Diet Ordered:: low fat heart health diet Do you have support at home?: Yes People in Home: alone Name of Support/Comfort Primary Source: [place]  Medications Reviewed Today: Medications Reviewed Today     Reviewed by Luella Cook, RN (Case Manager) on 12/12/22 at 1546  Med List Status: <None>   Medication Order Taking? Sig Documenting Provider Last Dose Status Informant  aspirin 325 MG tablet [id number] Yes Take 1 tablet (325 mg total) by mouth daily as needed. Antonieta Iba, MD Taking Active   calcium carbonate (TUMS - DOSED IN MG ELEMENTAL CALCIUM) 500 MG chewable tablet [id number] Yes Chew 1 tablet by mouth daily. [provider] Taking Active   fluorouracil (EFUDEX) 5 % cream [id number]  Apply a thin coat to the nose BID x 4 days.  Patient not taking: Reported on 12/05/2022   [facility], [place], MD  Active   fluorouracil (EFUDEX) 5 % cream [id number] Yes Apply twice daily to affected area at nose for 1 week as directed. Moye, [place], MD  Taking Active   hydrocortisone 2.5 % cream [id number] Yes Apply twice daily to affected areas at folds as needed for rash for up to 2 weeks. Moye, [place], MD Taking Active   ketoconazole (NIZORAL) 2 % cream [id number] Yes APPLY CREAM TOPICALLY TO AFFECTED AREAS AT BODY FOLDS TWICE DAILY AS NEEDED FOR Jetty Peeks, [place], MD Taking Active   propranolol (INDERAL) 10 MG tablet [id number] Yes Take 1 tablet (10 mg total) by mouth 3 (three) times daily as needed. Antonieta Iba, MD Taking Active             Home Care and Equipment/Supplies: Were Home Health Services Ordered?: NA Any new equipment or medical supplies ordered?: NA  Functional Questionnaire: Do you need assistance with bathing/showering or dressing?: No Do you need assistance with meal preparation?: No Do you need assistance with eating?: No Do you have difficulty maintaining continence: No Do you need assistance with getting out of bed/getting out of a chair/moving?: No Do you have difficulty managing or taking your medications?: No  Follow up appointments reviewed: PCP Follow-up appointment confirmed?: NA Specialist Hospital Follow-up appointment confirmed?: Yes Date of Specialist follow-up appointment?: 12/17/22 Follow-Up Specialty Provider:: [id number] Dr Everlene Farrier 2:30, [id number] Dr Tobi Bastos 1:45 Do you need transportation to your follow-up appointment?: No Do you understand care options if your condition(s) worsen?: Yes-patient verbalized understanding  SDOH Interventions Today    Flowsheet Row Most Recent Value  SDOH Interventions   Food Insecurity Interventions Intervention Not Indicated  Housing Interventions Intervention Not Indicated  Transportation Interventions Intervention Not Indicated      Interventions Today    Flowsheet Row Most Recent Value  General Interventions   General Interventions Discussed/Reviewed  General Interventions Discussed, General Interventions Reviewed, Doctor Visits  [Patient had  concerns about she has sleep apnea and has a cpap but doesn't wear it and she doesn't have a Dr to monitor it now.  RN discussed with pt about talking with PCp getting a sleep study and referral to DR to monitor the CPAP.]  Doctor Visits Discussed/Reviewed Doctor Visits Discussed, Doctor Visits Reviewed  Nutrition Interventions   Nutrition Discussed/Reviewed Nutrition Reviewed, Nutrition Discussed  [Patient wanted to know did avacodos affect the gall bladder. RN explained that they are high in fat and might stinulate the gallbladder]      TOC Interventions Today    Flowsheet Row Most Recent Value  TOC Interventions   TOC Interventions Discussed/Reviewed TOC Interventions Discussed, TOC Interventions Reviewed       Gean Maidens BSN RN [facility] [phone]

## 2022-12-12 NOTE — Transitions of Care (Post Inpatient/ED Visit) (Signed)
   12/12/2022  Name: Regina Nash MRN: [id number] DOB: 1950/08/15  Today's TOC FU Call Status: Today's TOC FU Call Status:: Unsuccessul Call (1st Attempt) Unsuccessful Call (1st Attempt) Date: 12/12/22  Attempted to reach the patient regarding the most recent Inpatient/ED visit.  Follow Up Plan: Additional outreach attempts will be made to reach the patient to complete the Transitions of Care (Post Inpatient/ED visit) call.   Gean Maidens BSN RN [facility] [phone]

## 2022-12-17 ENCOUNTER — Ambulatory Visit: Payer: Medicare Other | Admitting: Surgery

## 2022-12-17 ENCOUNTER — Encounter: Payer: Self-pay | Admitting: Surgery

## 2022-12-17 ENCOUNTER — Other Ambulatory Visit: Payer: Self-pay

## 2022-12-17 ENCOUNTER — Telehealth: Payer: Self-pay

## 2022-12-17 VITALS — BP 120/76 | HR 96 | Temp 98.4°F | Ht 64.0 in | Wt 173.6 lb

## 2022-12-17 DIAGNOSIS — R1011 Right upper quadrant pain: Secondary | ICD-10-CM

## 2022-12-17 DIAGNOSIS — K8020 Calculus of gallbladder without cholecystitis without obstruction: Secondary | ICD-10-CM

## 2022-12-17 NOTE — Patient Instructions (Addendum)
Your CT is scheduled for 12/24/22 5pm (arrive by 3 pm) at [facility]. Nothing to eat or drink 4 hours prior.  If you have any concerns or questions, please feel free to call our office.   Cholelithiasis  Cholelithiasis happens when gallstones form in the gallbladder. The gallbladder stores bile. Bile is a fluid that helps digest fats. Bile can harden and form into gallstones. If they cause a blockage, they can cause pain (gallbladder attack). What are the causes? This condition may be caused by: Too much bilirubin in the bile. This happens if you have sickle cell anemia. Too much of a fat-like substance (cholesterol) in your bile. Not enough bile salts in your bile. These salts help the body absorb and digest fats. The gallbladder not emptying fully or often enough. This is common in pregnant women. What increases the risk? The following factors may make you more likely to develop this condition: Being older than age 74. Eating a lot of fried foods, fat, and refined carbs (refined carbohydrates). Being female. Being pregnant many times. Using medicines with female hormones in them for a long time. Losing weight fast. Having gallstones in your family. Having health problems, such as diabetes, obesity, Crohn's disease, or liver disease. What are the signs or symptoms? Often, there may be gallstones but no symptoms. These gallstones are called silent gallstones. If a gallstone causes a blockage, you may get sudden pain. The pain: Can be in the upper right part of your belly (abdomen). Normally comes at night or after you eat. Can last an hour or more. Can spread to your right shoulder, back, or chest. Can feel like discomfort, burning, or fullness in the upper part of your belly (indigestion). If the blockage lasts more than a few hours, you can get an infection or swelling. You may: Vomit or feel like you may vomit (nauseous). Feel bloated. Have belly pain for 5 hours or  more. Feel tender in your belly, often in the upper right part and under your ribs. Have a fever or chills. Have skin or the white parts of your eyes turn yellow (jaundice). Have dark pee (urine) or pale poop (stool). How is this treated? Treatment for this condition depends on how bad you feel. If you have symptoms, you may need: Home care, if symptoms are not very bad. Do not eat for 12-24 hours. Drink only water and clear liquids. After 1 or 2 days, start to eat simple or clear foods. Try broth and crackers. You may need medicines for pain or stomach upset or both. If you have an infection, you will need antibiotics. A hospital stay, if you have very bad pain or a very bad infection. Surgery to remove your gallbladder. You may need this if: Gallstones keep coming back. You have very bad symptoms. Medicines to break up gallstones. Medicines may be used for 6-12 months. A procedure to find and take out gallstones or to break up gallstones. Follow these instructions at home: Medicines Take over-the-counter and prescription medicines only as told by your doctor. If you were prescribed antibiotics, take them as told by your doctor. Do not stop taking them even if you start to feel better. Ask your doctor if you should avoid driving or using machines while you are taking your medicine. Eating and drinking Drink enough fluid to keep your pee pale yellow. Drink water or clear fluids. This is important when you have pain. Eat healthy foods. Choose: Fewer fatty foods, such as fried  foods. Fewer refined carbs. Avoid breads and grains that are highly processed, such as white bread and white rice. Choose whole grains, such as whole-wheat bread and brown rice. More fiber. Almonds, fresh fruit, and beans are healthy sources. General instructions Keep a healthy weight. Keep all follow-up visits. You may need to see a specialist or a Careers adviser. Where to find more information [employer] of  Diabetes and Digestive and Kidney Diseases: [email or web address] Contact a doctor if: You have sudden pain in the upper right part of your belly. Pain might spread to your right shoulder, back, or chest. Your pain lasts more than 2 hours. You have been diagnosed with gallstones that have no symptoms and you get: Belly pain. Discomfort, burning, or fullness in the upper part of your abdomen. You keep feeling like you may vomit. You have dark pee or pale poop. Get help right away if: You have pain in your abdomen, that: Lasts more than 5 hours. Keeps getting worse. You have a fever or chills. You can't stop vomiting. Your skin or the white parts of your eyes turn yellow. This information is not intended to replace advice given to you by your health care provider. Make sure you discuss any questions you have with your health care provider. Document Revised: 04/16/2022 Document Reviewed: 04/16/2022 Elsevier Patient Education  2024 [employer].

## 2022-12-17 NOTE — Telephone Encounter (Signed)
-----   Message from [place], MD sent at 12/14/2022 11:04 AM EDT ----- Skin , left lateral calf SEBORRHEIC KERATOSIS, IRRITATED --> This is a benign growth or "wisdom spot". No additional treatment is needed.    MAs please call. Thank you!

## 2022-12-17 NOTE — Telephone Encounter (Signed)
Patient advised pathology report showed benign SK. Butch Penny., RMA

## 2022-12-18 ENCOUNTER — Ambulatory Visit (INDEPENDENT_AMBULATORY_CARE_PROVIDER_SITE_OTHER): Payer: Medicare Other | Admitting: Gastroenterology

## 2022-12-18 DIAGNOSIS — R1011 Right upper quadrant pain: Secondary | ICD-10-CM | POA: Diagnosis not present

## 2022-12-18 DIAGNOSIS — R1013 Epigastric pain: Secondary | ICD-10-CM | POA: Diagnosis not present

## 2022-12-18 NOTE — Patient Instructions (Signed)

## 2022-12-18 NOTE — Progress Notes (Signed)
Regina Mood MD, MRCP(U.K) [address]  [address]  [place], [place] [address]  Main: [phone]  Fax: [phone]   Primary Care Physician: Glori Luis, MD  Primary Gastroenterologist:  Dr. Wyline Nash   Chief Complaint  Patient presents with   Go over EGD results    HPI: Regina Nash is a 72 y.o. female   Summary of history : Seen by office back in 2021 for a positive Cologuard test and she is not interested in a colonoscopy at that time.  At that point of time she had right upper quadrant pain and had been seen by surgery.   Interval history   2021-12/18/2022   She was seen by Dr. Elenor Legato yesterday for right upper quadrant pain and cholelithiasis after an episode of epigastric pain on 12/07/2022 which took her to the emergency room occurred after eating a hard-boiled egg  12/08/2022 right upper quadrant ultrasound shows cholelithiasis. 12/08/2022 hemoglobin 14.6 g CMP showed normal transaminases lipase normal She has a CT scan of the abdomen that has been ordered by Dr. Everlene Farrier yesterday. She states that after the episode in the ER she was commenced on Prilosec prior to which she was taking about 2000 mg of Tums a day and all her abdominal pain has resolved.  She has noticed when she eats fatty food she gets abdominal discomfort.  Denies any NSAID use.  Current Outpatient Medications  Medication Sig Dispense Refill   aspirin 325 MG tablet Take 1 tablet (325 mg total) by mouth daily as needed.     calcium carbonate (TUMS - DOSED IN MG ELEMENTAL CALCIUM) 500 MG chewable tablet Chew 1 tablet by mouth daily.     fluorouracil (EFUDEX) 5 % cream Apply a thin coat to the nose BID x 4 days. 15 g 0   hydrocortisone 2.5 % cream Apply twice daily to affected areas at folds as needed for rash for up to 2 weeks. 30 g 1   nystatin cream (MYCOSTATIN) Apply 1 Application topically 2 (two) times daily.     propranolol (INDERAL) 10 MG tablet Take 1 tablet (10 mg total) by mouth 3  (three) times daily as needed. 60 tablet 3   No current facility-administered medications for this visit.    Allergies as of 12/18/2022 - Review Complete 12/17/2022  Allergen Reaction Noted   Codeine Other (See Comments) 06/21/2016   Erythromycin Other (See Comments) 06/21/2016   Penicillins  12/08/2022      ROS:  General: Negative for anorexia, weight loss, fever, chills, fatigue, weakness. ENT: Negative for hoarseness, difficulty swallowing , nasal congestion. CV: Negative for chest pain, angina, palpitations, dyspnea on exertion, peripheral edema.  Respiratory: Negative for dyspnea at rest, dyspnea on exertion, cough, sputum, wheezing.  GI: See history of present illness. GU:  Negative for dysuria, hematuria, urinary incontinence, urinary frequency, nocturnal urination.  Endo: Negative for unusual weight change.    Physical Examination:   LMP 02/16/2010 (Exact Date)   General: Well-nourished, well-developed in no acute distress.  Eyes: No icterus. Conjunctivae pink. Mouth: Oropharyngeal mucosa moist and pink , no lesions erythema or exudate. Neuro: Alert and oriented x 3.  Grossly intact. Skin: Warm and dry, no jaundice.   Psych: Alert and cooperative, normal Nash and affect.   Imaging Studies: US Abdomen Limited RUQ (LIVER/GB)  Result Date: 12/08/2022 CLINICAL DATA:  Upper abdominal pain EXAM: ULTRASOUND ABDOMEN LIMITED RIGHT UPPER QUADRANT COMPARISON:  09/29/2019 FINDINGS: Gallbladder: Multiple gallstones, the largest 1.3 cm. No wall  thickening or sonographic Murphy sign. Common bile duct: Diameter: Normal caliber, 3 mm. Liver: Increased echotexture compatible with fatty infiltration. No focal abnormality or biliary ductal dilatation. Portal vein is patent on color Doppler imaging with normal direction of blood flow towards the liver. Other: None. IMPRESSION: Cholelithiasis.  No sonographic evidence of acute cholecystitis. Fatty liver. Electronically Signed   By: Charlett Nose M.D.   On: 12/08/2022 18:24    Assessment and Plan:   Regina Nash is a 72 y.o. y/o female is here to see me after an ER visit when she ate a boiled egg and had significant epigastric pain which resolved within 30 minutes.  The history is very suggestive of biliary colic she does have cholelithiasis.  She has had significant pains in the past.  She also does have some history suggestive of acid related dyspeptic symptoms.  She has had significant improvement with Prilosec on a daily basis.  She potentially could have biliary colic as well as acid reflux.  Plan 1.  Follow-up with Dr. Everlene Farrier after CAT scan 2.  Continue Prilosec which she is taking at 20 mg a day.  Patient information about acid reflux provided.  Suggest to use her GERD wedge pillow.  Avoid eating for 2 to 3 hours before bedtime 3.  Discussed with her that she could potentially consider a HIDA scan down the road.  She is very terrified about surgery/cholecystectomy/colonoscopy/endoscopy. 4.  At this point of time I do not see any indication for an endoscopy.  We did talk about her prior Cologuard test which is positive that I have recommended a colonoscopy for.  Discussed about the procedure and advised her to let us know when she decides to have it done    Dr Regina Mood  MD,MRCP [facility]) Follow up in as needed

## 2022-12-19 ENCOUNTER — Encounter: Payer: Self-pay | Admitting: Surgery

## 2022-12-19 DIAGNOSIS — R1011 Right upper quadrant pain: Secondary | ICD-10-CM | POA: Diagnosis not present

## 2022-12-19 NOTE — Progress Notes (Signed)
Outpatient Surgical Follow Up  12/19/2022  Regina Nash is an 72 y.o. female.   Chief Complaint  Patient presents with   New Patient (Initial Visit)    Gallbladder     HPI: Regina Nash is a 72 year old female who presents with chronic upper abdominal pain.  Recently came to the emergency room last week since she had an attack.  She reports that she had severe pain that lasted for few minutes in the upper abdomen.  SHe had a hard-boiled egg at that time.  She got to the hospital and her symptoms have already improved. She  Did have an ultrasound that I personally reviewed showing evidence of gallstones without cholecystitis or common bile duct.  Did have abnormal LFTs lipase as well as CBC. Endorses pain in the epigastric region as well as the right upper quadrant that is chronic and has been going on for couple of years or so.  He reports that sometimes she has trouble when she eats [place] fries and some greasy meals. Also has an appointment tomorrow with Dr. Tobi Bastos from [facility] seems to be very hesitant and scared for potential surgical intervention  Past Medical History:  Diagnosis Date   Allergy    Anxiety    Arrhythmia    Arthritis    Asthma    Chronic bronchitis (HCC)    Chronic kidney disease    Depression    Fatty liver    Gallstones    Genital warts    GERD (gastroesophageal reflux disease)    History of recurrent UTIs    Hyperglycemia    IBS (irritable bowel syndrome)    Migraine    PTSD (post-traumatic stress disorder)    Sleep apnea    Substance abuse (HCC)    Urine incontinence     Past Surgical History:  Procedure Laterality Date   TUBAL LIGATION      Family History  Problem Relation Age of Onset   Alcoholism Other        Parent   Arthritis Other        Parent   Cancer Other        Breast, ovarian/uterine   Breast cancer Paternal Aunt        4's   Alcohol abuse Mother    Anxiety disorder Mother    Alcohol abuse Father    Anxiety disorder Sister     Anxiety disorder Sister    Depression Sister    Rheum arthritis Maternal Grandfather    Colon cancer Neg Hx     Social History:  reports that she has been smoking cigarettes. She has been smoking an average of .5 packs per day. She has never used smokeless tobacco. She reports that she does not drink alcohol and does not use drugs.  Allergies:  Allergies  Allergen Reactions   Codeine Other (See Comments)   Erythromycin Other (See Comments)    abdominal pain   Penicillins     Medications reviewed.    ROS Full ROS performed and is otherwise negative other than what is stated in HPI   BP 120/76   Pulse 96   Temp 98.4 F (36.9 C) (Oral)   Ht 5\' 4"  (1.626 m)   Wt 173 lb 9.6 oz (78.7 kg)   LMP 02/16/2010 (Exact Date)   SpO2 97%   BMI 29.80 kg/m   Physical Exam Vitals and nursing note reviewed.  Constitutional:      General: She is not in acute distress.  Appearance: Normal appearance. She is not ill-appearing.  Eyes:     General:        Right eye: No discharge.        Left eye: No discharge.  Cardiovascular:     Rate and Rhythm: Normal rate and regular rhythm.     Heart sounds: No murmur heard. Pulmonary:     Effort: Pulmonary effort is normal. No respiratory distress.     Breath sounds: Normal breath sounds. No stridor. No wheezing or rhonchi.  Abdominal:     General: Abdomen is flat. There is no distension.     Palpations: There is no mass.     Tenderness: There is no abdominal tenderness. There is no guarding.     Hernia: No hernia is present.  Skin:    General: Skin is warm and dry.     Capillary Refill: Capillary refill takes less than 2 seconds.     Coloration: Skin is not jaundiced or pale.  Neurological:     General: No focal deficit present.     Mental Status: She is alert.  Psychiatric:        Mood and Affect: Mood normal.        Behavior: Behavior normal.        Thought Content: Thought content normal.        Judgment: Judgment normal.      Assessment/Plan: 72 year old female with chronic abdominal pain and evidence of cholelithiasis.  Her clinical presentation can be potentially explained by biliary colic.  I was very sincere and honest with her and expressed that cholecystectomy would not necessarily guarantee that her pain will go away but that we can potentially prevent complications related to cholelithiasis.  She seems to be also worried about surgical intervention.  I did encourage her to keep her appointment with GI so they can also discuss potential for endoscopic evaluation.  I Do think will be prudent to obtain a CT scan to check for other potential pathologies. Be happy to see her back in a few weeks when she completes her CT scan.  Need for emergent surgical intervention.  Please note that I spent 60 minutes in this encounter including personally reviewing imaging studies, coordinating her care, placing orders and performing appropriate documentation.   Sterling Big, MD [facility] General Surgeon

## 2022-12-20 LAB — H. PYLORI BREATH TEST: H pylori Breath Test: NEGATIVE

## 2022-12-24 ENCOUNTER — Ambulatory Visit
Admission: RE | Admit: 2022-12-24 | Discharge: 2022-12-24 | Disposition: A | Discharge status: Home / Self Care | Payer: Medicare Other | Source: Ambulatory Visit | Attending: Surgery | Admitting: Surgery

## 2022-12-24 DIAGNOSIS — R1011 Right upper quadrant pain: Secondary | ICD-10-CM | POA: Diagnosis not present

## 2022-12-24 DIAGNOSIS — R109 Unspecified abdominal pain: Secondary | ICD-10-CM | POA: Diagnosis not present

## 2022-12-24 DIAGNOSIS — I700 Atherosclerosis of aorta: Secondary | ICD-10-CM | POA: Diagnosis not present

## 2022-12-26 ENCOUNTER — Ambulatory Visit: Payer: PRIVATE HEALTH INSURANCE | Admitting: Surgery

## 2022-12-31 ENCOUNTER — Encounter: Payer: Self-pay | Admitting: Surgery

## 2022-12-31 ENCOUNTER — Ambulatory Visit: Payer: Medicare Other | Admitting: Surgery

## 2022-12-31 VITALS — BP 114/74 | HR 81 | Temp 97.9°F | Ht 64.0 in | Wt 172.8 lb

## 2022-12-31 DIAGNOSIS — K8020 Calculus of gallbladder without cholecystitis without obstruction: Secondary | ICD-10-CM

## 2022-12-31 NOTE — Patient Instructions (Addendum)
If you have any concerns or questions, please feel free to call our office. Follow up as needed.   Cholelithiasis  Cholelithiasis happens when gallstones form in the gallbladder. The gallbladder stores bile. Bile is a fluid that helps digest fats. Bile can harden and form into gallstones. If they cause a blockage, they can cause pain (gallbladder attack). What are the causes? This condition may be caused by: Too much bilirubin in the bile. This happens if you have sickle cell anemia. Too much of a fat-like substance (cholesterol) in your bile. Not enough bile salts in your bile. These salts help the body absorb and digest fats. The gallbladder not emptying fully or often enough. This is common in pregnant women. What increases the risk? The following factors may make you more likely to develop this condition: Being older than age 57. Eating a lot of fried foods, fat, and refined carbs (refined carbohydrates). Being female. Being pregnant many times. Using medicines with female hormones in them for a long time. Losing weight fast. Having gallstones in your family. Having health problems, such as diabetes, obesity, Crohn's disease, or liver disease. What are the signs or symptoms? Often, there may be gallstones but no symptoms. These gallstones are called silent gallstones. If a gallstone causes a blockage, you may get sudden pain. The pain: Can be in the upper right part of your belly (abdomen). Normally comes at night or after you eat. Can last an hour or more. Can spread to your right shoulder, back, or chest. Can feel like discomfort, burning, or fullness in the upper part of your belly (indigestion). If the blockage lasts more than a few hours, you can get an infection or swelling. You may: Vomit or feel like you may vomit (nauseous). Feel bloated. Have belly pain for 5 hours or more. Feel tender in your belly, often in the upper right part and under your ribs. Have a fever or  chills. Have skin or the white parts of your eyes turn yellow (jaundice). Have dark pee (urine) or pale poop (stool). How is this treated? Treatment for this condition depends on how bad you feel. If you have symptoms, you may need: Home care, if symptoms are not very bad. Do not eat for 12-24 hours. Drink only water and clear liquids. After 1 or 2 days, start to eat simple or clear foods. Try broth and crackers. You may need medicines for pain or stomach upset or both. If you have an infection, you will need antibiotics. A hospital stay, if you have very bad pain or a very bad infection. Surgery to remove your gallbladder. You may need this if: Gallstones keep coming back. You have very bad symptoms. Medicines to break up gallstones. Medicines may be used for 6-12 months. A procedure to find and take out gallstones or to break up gallstones. Follow these instructions at home: Medicines Take over-the-counter and prescription medicines only as told by your doctor. If you were prescribed antibiotics, take them as told by your doctor. Do not stop taking them even if you start to feel better. Ask your doctor if you should avoid driving or using machines while you are taking your medicine. Eating and drinking Drink enough fluid to keep your pee pale yellow. Drink water or clear fluids. This is important when you have pain. Eat healthy foods. Choose: Fewer fatty foods, such as fried foods. Fewer refined carbs. Avoid breads and grains that are highly processed, such as white bread and white rice. Choose  whole grains, such as whole-wheat bread and brown rice. More fiber. Almonds, fresh fruit, and beans are healthy sources. General instructions Keep a healthy weight. Keep all follow-up visits. You may need to see a specialist or a Careers adviser. Where to find more information [employer] of Diabetes and Digestive and Kidney Diseases: [email or web address] Contact a doctor if: You have sudden pain in  the upper right part of your belly. Pain might spread to your right shoulder, back, or chest. Your pain lasts more than 2 hours. You have been diagnosed with gallstones that have no symptoms and you get: Belly pain. Discomfort, burning, or fullness in the upper part of your abdomen. You keep feeling like you may vomit. You have dark pee or pale poop. Get help right away if: You have pain in your abdomen, that: Lasts more than 5 hours. Keeps getting worse. You have a fever or chills. You can't stop vomiting. Your skin or the white parts of your eyes turn yellow. This information is not intended to replace advice given to you by your health care provider. Make sure you discuss any questions you have with your health care provider. Document Revised: 04/16/2022 Document Reviewed: 04/16/2022 Elsevier Patient Education  2024 [employer].

## 2023-01-01 NOTE — Progress Notes (Signed)
Outpatient Surgical Follow Up  01/01/2023  Regina Nash is an 72 y.o. female.   Chief Complaint  Patient presents with   Follow-up    Cholelithiasis     HPI: Regina Nash is a 72 year old female who presents with chronic upper abdominal pain.   She  Did have an ultrasound that I personally reviewed showing evidence of gallstones without cholecystitis or common bile duct.  Did have abnormal LFTs lipase as well as CBC. Endorses pain in the epigastric region as well as the right upper quadrant that is chronic and has been going on for couple of years or so. SHe reports that sometimes she has trouble when she eats [place] fries and some greasy meals. SHe completed CT that I have pers reviwed showing GS w small HH but no other acute issues. Her daughter did not have a good experience at [facility] when she had cholecystectomy SHe seems to be very hesitant and scared for potential surgical intervention  Past Medical History:  Diagnosis Date   Allergy    Anxiety    Arrhythmia    Arthritis    Asthma    Chronic bronchitis (HCC)    Chronic kidney disease    Depression    Fatty liver    Gallstones    Genital warts    GERD (gastroesophageal reflux disease)    History of recurrent UTIs    Hyperglycemia    IBS (irritable bowel syndrome)    Migraine    PTSD (post-traumatic stress disorder)    Sleep apnea    Substance abuse (HCC)    Urine incontinence     Past Surgical History:  Procedure Laterality Date   TUBAL LIGATION      Family History  Problem Relation Age of Onset   Alcoholism Other        Parent   Arthritis Other        Parent   Cancer Other        Breast, ovarian/uterine   Breast cancer Paternal Aunt        61's   Alcohol abuse Mother    Anxiety disorder Mother    Alcohol abuse Father    Anxiety disorder Sister    Anxiety disorder Sister    Depression Sister    Rheum arthritis Maternal Grandfather    Colon cancer Neg Hx     Social History:  reports that she has been  smoking cigarettes. She has been smoking an average of .5 packs per day. She has never used smokeless tobacco. She reports that she does not drink alcohol and does not use drugs.  Allergies:  Allergies  Allergen Reactions   Codeine Other (See Comments)   Erythromycin Other (See Comments)    abdominal pain   Penicillins     Medications reviewed.    ROS Full ROS performed and is otherwise negative other than what is stated in HPI   BP 114/74   Pulse 81   Temp 97.9 F (36.6 C) (Oral)   Ht 5\' 4"  (1.626 m)   Wt 172 lb 12.8 oz (78.4 kg)   LMP 02/16/2010 (Exact Date)   SpO2 97%   BMI 29.66 kg/m   Physical Exam Vitals and nursing note reviewed.  Constitutional:      General: She is not in acute distress.    Appearance: Normal appearance. She is not ill-appearing.  Eyes:     General:        Right eye: No discharge.  Left eye: No discharge.  Cardiovascular:     Rate and Rhythm: Normal rate and regular rhythm.     Heart sounds: No murmur heard. Pulmonary:     Effort: Pulmonary effort is normal. No respiratory distress.     Breath sounds: Normal breath sounds. No stridor. No wheezing or rhonchi.  Abdominal:     General: Abdomen is flat. There is no distension.     Palpations: There is no mass.     Tenderness: There is no abdominal tenderness. There is no guarding.     Hernia: No hernia is present.  Skin:    General: Skin is warm and dry.     Capillary Refill: Capillary refill takes less than 2 seconds.     Coloration: Skin is not jaundiced or pale.  Neurological:     General: No focal deficit present.     Mental Status: She is alert.  Psychiatric:        Mood and Affect: Mood normal.        Behavior: Behavior normal.        Thought Content: Thought content normal.        Judgment: Judgment normal.   Assessment/Plan: 72 year old female with chronic abdominal pain and evidence of cholelithiasis.  Her clinical presentation can be potentially explained by biliary  colic.  Discussed with her extensively about robotic cholecystectomy.  Had a lot of questions that I was able to answer appropriately  the risks and benefits and the possible complications including but not limited to bowel injuries, infections, bleeding and bowel injuries.  He is not ready to make any decisions.  She will call us back whenever she is ready for potential cholecystectomy.  At this time no need for urgent surgical intervention  She seems to be also worried about surgical intervention.  Please note that I spent 40 minutes in this encounter including personally reviewing imaging studies, coordinating her care, placing orders and performing appropriate documentation.    Sterling Big, MD [facility] General Surgeon

## 2023-01-02 ENCOUNTER — Telehealth: Payer: Self-pay | Admitting: Cardiovascular Disease

## 2023-01-02 ENCOUNTER — Ambulatory Visit: Payer: Medicare Other | Admitting: Surgery

## 2023-01-02 NOTE — Telephone Encounter (Signed)
Pt called stating she is schedule to have gallbladder surgery and was informed she needs an ECHO. Pt denies SOB or CP, but reports she gets tired easily.  Pt last seen in our office July, 2023. Nurse recommended pt schedule an appointment for further evaluations.  Appointment scheduled for 01/04/23.

## 2023-01-02 NOTE — Telephone Encounter (Signed)
Patient calling in to request to have an echo done. Please advise
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
## 2023-01-04 ENCOUNTER — Encounter: Payer: Self-pay | Admitting: Medical

## 2023-01-04 ENCOUNTER — Ambulatory Visit: Payer: Medicare Other | Attending: Medical | Admitting: Medical

## 2023-01-04 ENCOUNTER — Other Ambulatory Visit
Admission: RE | Admit: 2023-01-04 | Discharge: 2023-01-04 | Disposition: A | Discharge status: Home / Self Care | Payer: Medicare Other | Source: Ambulatory Visit | Attending: Medical | Admitting: Medical

## 2023-01-04 VITALS — BP 123/82 | HR 74 | Ht 64.5 in | Wt 173.2 lb

## 2023-01-04 DIAGNOSIS — G4730 Sleep apnea, unspecified: Secondary | ICD-10-CM

## 2023-01-04 DIAGNOSIS — Z01810 Encounter for preprocedural cardiovascular examination: Secondary | ICD-10-CM | POA: Diagnosis not present

## 2023-01-04 DIAGNOSIS — I479 Paroxysmal tachycardia, unspecified: Secondary | ICD-10-CM

## 2023-01-04 DIAGNOSIS — I2510 Atherosclerotic heart disease of native coronary artery without angina pectoris: Secondary | ICD-10-CM

## 2023-01-04 DIAGNOSIS — Z79899 Other long term (current) drug therapy: Secondary | ICD-10-CM | POA: Diagnosis not present

## 2023-01-04 DIAGNOSIS — Z720 Tobacco use: Secondary | ICD-10-CM

## 2023-01-04 DIAGNOSIS — G4733 Obstructive sleep apnea (adult) (pediatric): Secondary | ICD-10-CM

## 2023-01-04 LAB — COMPREHENSIVE METABOLIC PANEL
ALT: 24 U/L (ref 0–44)
AST: 25 U/L (ref 15–41)
Albumin: 3.8 g/dL (ref 3.5–5.0)
Alkaline Phosphatase: 64 U/L (ref 38–126)
Anion gap: 6 (ref 5–15)
BUN: 10 mg/dL (ref 8–23)
CO2: 28 mmol/L (ref 22–32)
Calcium: 9.5 mg/dL (ref 8.9–10.3)
Chloride: 103 mmol/L (ref 98–111)
Creatinine, Ser: 0.83 mg/dL (ref 0.44–1.00)
GFR, Estimated: >60 mL/min (ref >60)
Glucose, Bld: 111 mg/dL — ABNORMAL HIGH (ref 70–99)
Potassium: 4.2 mmol/L (ref 3.5–5.1)
Sodium: 137 mmol/L (ref 135–145)
Total Bilirubin: 0.5 mg/dL (ref 0.3–1.2)
Total Protein: 7.3 g/dL (ref 6.5–8.1)

## 2023-01-04 LAB — CBC
HCT: 44.2 % (ref 36.0–46.0)
Hemoglobin: 14.5 g/dL (ref 12.0–15.0)
MCH: 30.5 pg (ref 26.0–34.0)
MCHC: 32.8 g/dL (ref 30.0–36.0)
MCV: 92.9 fL (ref 80.0–100.0)
Platelets: 203 10*3/uL (ref 150–400)
RBC: 4.76 MIL/uL (ref 3.87–5.11)
RDW: 12 % (ref 11.5–15.5)
WBC: 6.8 10*3/uL (ref 4.0–10.5)
nRBC: 0 % (ref 0.0–0.2)

## 2023-01-04 LAB — LIPID PANEL
Cholesterol: 219 mg/dL — ABNORMAL HIGH (ref 0–200)
HDL: 39 mg/dL — ABNORMAL LOW (ref >40)
LDL Cholesterol: 141 mg/dL — ABNORMAL HIGH (ref 0–99)
Total CHOL/HDL Ratio: 5.6 RATIO
Triglycerides: 193 mg/dL — ABNORMAL HIGH (ref <150)
VLDL: 39 mg/dL (ref 0–40)

## 2023-01-04 LAB — LDL CHOLESTEROL, DIRECT: Direct LDL: 147 mg/dL — ABNORMAL HIGH (ref 0–99)

## 2023-01-04 LAB — TSH: TSH: 1.851 u[IU]/mL (ref 0.350–4.500)

## 2023-01-04 MED ORDER — ASPIRIN 81 MG PO TBEC: 81.0000 mg | DELAYED_RELEASE_TABLET | Freq: Every day | ORAL | 3 refills | Status: DC

## 2023-01-04 MED ORDER — PROPRANOLOL HCL 10 MG PO TABS: 10.0000 mg | ORAL_TABLET | Freq: Three times a day (TID) | ORAL | 3 refills | Status: DC | PRN

## 2023-01-04 NOTE — Patient Instructions (Signed)
Medication Instructions:  Your physician recommends the following medication changes.  STOP TAKING: Aspirin 325 mg daily   START TAKING: Aspirin 81 mg daily.  *If you need a refill on your cardiac medications before your next appointment, please call your pharmacy*   Lab Work: Your provider would like for you to have following labs drawn: CMET, CBC, TSH, Direct Lipid and Lipid panel.   Please go to the [facility] entrance and check in at the front desk.  You do not need an appointment.  They are open from 7am-6 pm.   If you have labs (blood work) drawn today and your tests are completely normal, you will receive your results only by: MyChart Message (if you have MyChart) OR A paper copy in the mail If you have any lab test that is abnormal or we need to change your treatment, we will call you to review the results.   Testing/Procedures: None ordered today   Follow-Up: At [facility], you and your health needs are our priority.  As part of our continuing mission to provide you with exceptional heart care, we have created designated Provider Care Teams.  These Care Teams include your primary Cardiologist (physician) and Advanced Practice Providers (APPs -  Physician Assistants and Nurse Practitioners) who all work together to provide you with the care you need, when you need it.  We recommend signing up for the patient portal called "MyChart".  Sign up information is provided on this After Visit Summary.  MyChart is used to connect with patients for Virtual Visits (Telemedicine).  Patients are able to view lab/test results, encounter notes, upcoming appointments, etc.  Non-urgent messages can be sent to your provider as well.   To learn more about what you can do with MyChart, go to [email or web address].    Your next appointment:   12 month(s)  Provider:   You may see Julien Nordmann, MD or one of the following Advanced Practice Providers on your designated Care  Team:   Nicolasa Ducking, NP Eula Listen, PA-C Cadence Fransico Michael, PA-C Charlsie Quest, NP

## 2023-01-14 ENCOUNTER — Encounter: Payer: Self-pay | Admitting: Family Medicine

## 2023-01-14 ENCOUNTER — Ambulatory Visit (INDEPENDENT_AMBULATORY_CARE_PROVIDER_SITE_OTHER): Payer: Medicare Other | Admitting: Family Medicine

## 2023-01-14 VITALS — BP 138/82 | HR 84 | Temp 98.4°F | Ht 64.5 in | Wt 173.2 lb

## 2023-01-14 DIAGNOSIS — G4730 Sleep apnea, unspecified: Secondary | ICD-10-CM

## 2023-01-14 DIAGNOSIS — M545 Low back pain, unspecified: Secondary | ICD-10-CM | POA: Diagnosis not present

## 2023-01-14 DIAGNOSIS — L570 Actinic keratosis: Secondary | ICD-10-CM

## 2023-01-14 DIAGNOSIS — E782 Mixed hyperlipidemia: Secondary | ICD-10-CM | POA: Diagnosis not present

## 2023-01-14 DIAGNOSIS — M549 Dorsalgia, unspecified: Secondary | ICD-10-CM | POA: Insufficient documentation

## 2023-01-14 NOTE — Assessment & Plan Note (Signed)
Chronic issue.  Untreated at this time.  I will order a new sleep study to determine what settings she should have.  Discussed getting appropriately treated for her sleep apnea may help with her exhaustion and memory troubles.

## 2023-01-14 NOTE — Progress Notes (Signed)
Marikay Alar, MD Phone: [phone]  Regina Nash is a 72 y.o. female who presents today for follow-up.  Back pain: Patient reports back pain recently that she states was kidney pain.  She noted no dysuria, urinary frequency, or hematuria.  She thinks it may have been related to drinking too much soda and she cut that down and the discomfort has resolved.  OSA: Patient notes that she has had a CPAP for about 4 years though does not wear it much and notes it was at least a year after she got the CPAP that she started to wear it.  She feels smothered by it and notes she would wake up getting choked when she would use it.  She wonders about getting a new CPAP.  She does note hypersomnia and being perpetually exhausted.  She also notes some memory difficulty.  Actinic keratoses: Patient notes she saw dermatology and they prescribed Efudex for her to use on her nose for actinic keratoses.  She read the package insert and was concerned about what could happen if she had dihydropyridine dehydrogenase deficiency.  Hyperlipidemia: Patient notes her diet has changed quite a bit since she started having gallbladder issues.  She notes she has lost some weight.  Her cholesterol has not improved with these changes.  The 10-year ASCVD risk score (Arnett DK, et al., 2019) is: 17.8%   Values used to calculate the score:     Age: 58 years     Sex: Female     Is Non-Hispanic African American: No     Diabetic: No     Tobacco smoker: Yes     Systolic Blood Pressure: 138 mmHg     Is BP treated: No     HDL Cholesterol: 39 mg/dL     Total Cholesterol: 219 mg/dL   Social History   Tobacco Use  Smoking Status Every Day   Packs/day: .5   Types: Cigarettes  Smokeless Tobacco Never  Tobacco Comments   Smokes 5 half cigarettes daily.     Current Outpatient Medications on File Prior to Visit  Medication Sig Dispense Refill   aspirin EC 81 MG tablet Take 1 tablet (81 mg total) by mouth daily. Swallow  whole. 90 tablet 3   fluorouracil (EFUDEX) 5 % cream Apply a thin coat to the nose BID x 4 days. 15 g 0   hydrocortisone 2.5 % cream Apply twice daily to affected areas at folds as needed for rash for up to 2 weeks. 30 g 1   omeprazole (PRILOSEC) 20 MG capsule Take 20 mg by mouth daily.     propranolol (INDERAL) 10 MG tablet Take 1 tablet (10 mg total) by mouth 3 (three) times daily as needed. 60 tablet 3   No current facility-administered medications on file prior to visit.     ROS see history of present illness  Objective  Physical Exam Vitals:   01/14/23 1546  BP: 138/82  Pulse: 84  Temp: 98.4 F (36.9 C)  SpO2: 98%    BP Readings from Last 3 Encounters:  01/14/23 138/82  01/04/23 123/82  12/31/22 114/74   Wt Readings from Last 3 Encounters:  01/14/23 173 lb 3.2 oz (78.6 kg)  01/04/23 173 lb 3.2 oz (78.6 kg)  12/31/22 172 lb 12.8 oz (78.4 kg)    Physical Exam Constitutional:      General: She is not in acute distress.    Appearance: She is not diaphoretic.  Cardiovascular:     Rate and  Rhythm: Normal rate and regular rhythm.     Heart sounds: Normal heart sounds.  Pulmonary:     Effort: Pulmonary effort is normal.     Breath sounds: Normal breath sounds.  Skin:    General: Skin is warm and dry.  Neurological:     Mental Status: She is alert.      Assessment/Plan: Please see individual problem list.  Sleep apnea, unspecified type Assessment & Plan: Chronic issue.  Untreated at this time.  I will order a new sleep study to determine what settings she should have.  Discussed getting appropriately treated for her sleep apnea may help with her exhaustion and memory troubles.  Orders: -     PSG SLEEP STUDY; Future  Mixed hyperlipidemia Assessment & Plan: Chronic issue.  Discussed that the patient's cholesterol issues may be genetic in nature and diet and exercise changes may not prove to be as beneficial for her as they are for some people.  Discussed this  may be why her cholesterol has not changed much with the changes she has made.  Patient is hesitant to start on a statin.  Discussed that a statin would help reduce her risk of stroke and heart attack.   Bilateral low back pain without sciatica, unspecified chronicity Assessment & Plan: Suspect musculoskeletal in nature this certainly could have been her kidneys.  Regardless this has resolved.  She will monitor for recurrence.   Actinic keratoses Assessment & Plan: Patient was prescribed Efudex by dermatology.  I encouraged her to reach out to them regarding her concerns surrounding dihydropyridine dehydrogenase deficiency and use of Efudex.     Return in about 4 months (around 05/17/2023) for OSA.  I have spent 35 minutes in the care of this patient regarding history taking, documentation, completion of exam, discussion of plan, placing orders.   Marikay Alar, MD [facility]

## 2023-01-14 NOTE — Assessment & Plan Note (Signed)
Patient was prescribed Efudex by dermatology.  I encouraged her to reach out to them regarding her concerns surrounding dihydropyridine dehydrogenase deficiency and use of Efudex.

## 2023-01-14 NOTE — Assessment & Plan Note (Addendum)
Chronic issue.  Discussed that the patient's cholesterol issues may be genetic in nature and diet and exercise changes may not prove to be as beneficial for her as they are for some people.  Discussed this may be why her cholesterol has not changed much with the changes she has made.  Patient is hesitant to start on a statin.  Discussed that a statin would help reduce her risk of stroke and heart attack.

## 2023-01-14 NOTE — Assessment & Plan Note (Signed)
Suspect musculoskeletal in nature this certainly could have been her kidneys.  Regardless this has resolved.  She will monitor for recurrence.

## 2023-01-14 NOTE — Patient Instructions (Signed)
Nice to see you. Somebody will contact you to schedule sleep study.  If you do not hear from them in the next 1 to 2 weeks please let us know. Please consider statin to help reduce risk of stroke and heart attack. I would suggest you check with the dermatologist regarding your concern for the dihydropyridine dehydrogenase deficiency to determine if you need any evaluation for this prior to using the Efudex.

## 2023-01-31 ENCOUNTER — Ambulatory Visit: Payer: Medicare Other | Admitting: Gastroenterology

## 2023-02-04 ENCOUNTER — Ambulatory Visit (INDEPENDENT_AMBULATORY_CARE_PROVIDER_SITE_OTHER): Payer: Medicare Other | Admitting: *Deleted

## 2023-02-04 VITALS — Ht 64.5 in | Wt 168.0 lb

## 2023-02-04 DIAGNOSIS — Z0000 Encounter for general adult medical examination without abnormal findings: Secondary | ICD-10-CM
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
## 2023-02-04 NOTE — Patient Instructions (Signed)
Per patient no change in vitals since last visit, unable to obtain new vitals due to telehealth visit Regina Nash , Thank you for taking time to come for your Medicare Wellness Visit. I appreciate your ongoing commitment to your health goals. Please review the following plan we discussed and let me know if I can assist you in the future.   These are the goals we discussed:  Goals      Increase physical activity     Walk more for exercise     Patient Stated     Lose weight, quit smoking, get sleep apnea under control, possibly gallbladder removed        This is a list of the screening recommended for you and due dates:  Health Maintenance  Topic Date Due   DTaP/Tdap/Td vaccine (1 - Tdap) Never done   Cologuard (Stool DNA test)  11/12/2020   Medicare Annual Wellness Visit  02/04/2024   Mammogram  06/20/2024   DEXA scan (bone density measurement)  Completed   Hepatitis C Screening  Completed   HPV Vaccine  Aged Out   Pneumonia Vaccine  Discontinued   Flu Shot  Discontinued   COVID-19 Vaccine  Discontinued   Zoster (Shingles) Vaccine  Discontinued    Advanced directives: Will work on this  Conditions/risks identified: None  Next appointment: Follow up in one year for your annual wellness visit 02/05/24 @ 2:15   Preventive Care 65 Years and Older, Female Preventive care refers to lifestyle choices and visits with your health care provider that can promote health and wellness. What does preventive care include? A yearly physical exam. This is also called an annual well check. Dental exams once or twice a year. Routine eye exams. Ask your health care provider how often you should have your eyes checked. Personal lifestyle choices, including: Daily care of your teeth and gums. Regular physical activity. Eating a healthy diet. Avoiding tobacco and drug use. Limiting alcohol use. Practicing safe sex. Taking low-dose aspirin every day. Taking vitamin and mineral supplements as  recommended by your health care provider. What happens during an annual well check? The services and screenings done by your health care provider during your annual well check will depend on your age, overall health, lifestyle risk factors, and family history of disease. Counseling  Your health care provider may ask you questions about your: Alcohol use. Tobacco use. Drug use. Emotional well-being. Home and relationship well-being. Sexual activity. Eating habits. History of falls. Memory and ability to understand (cognition). Work and work Astronomer. Reproductive health. Screening  You may have the following tests or measurements: Height, weight, and BMI. Blood pressure. Lipid and cholesterol levels. These may be checked every 5 years, or more frequently if you are over 74 years old. Skin check. Lung cancer screening. You may have this screening every year starting at age 11 if you have a 30-pack-year history of smoking and currently smoke or have quit within the past 15 years. Fecal occult blood test (FOBT) of the stool. You may have this test every year starting at age 70. Flexible sigmoidoscopy or colonoscopy. You may have a sigmoidoscopy every 5 years or a colonoscopy every 10 years starting at age 13. Hepatitis C blood test. Hepatitis B blood test. Sexually transmitted disease (STD) testing. Diabetes screening. This is done by checking your blood sugar (glucose) after you have not eaten for a while (fasting). You may have this done every 1-3 years. Bone density scan. This is done to screen  for osteoporosis. You may have this done starting at age 8. Mammogram. This may be done every 1-2 years. Talk to your health care provider about how often you should have regular mammograms. Talk with your health care provider about your test results, treatment options, and if necessary, the need for more tests. Vaccines  Your health care provider may recommend certain vaccines, such  as: Influenza vaccine. This is recommended every year. Tetanus, diphtheria, and acellular pertussis (Tdap, Td) vaccine. You may need a Td booster every 10 years. Zoster vaccine. You may need this after age 90. Pneumococcal 13-valent conjugate (PCV13) vaccine. One dose is recommended after age 62. Pneumococcal polysaccharide (PPSV23) vaccine. One dose is recommended after age 4. Talk to your health care provider about which screenings and vaccines you need and how often you need them. This information is not intended to replace advice given to you by your health care provider. Make sure you discuss any questions you have with your health care provider. Document Released: 07/29/2015 Document Revised: 03/21/2016 Document Reviewed: 05/03/2015 Elsevier Interactive Patient Education  2017 [employer].  Fall Prevention in the Home Falls can cause injuries. They can happen to people of all ages. There are many things you can do to make your home safe and to help prevent falls. What can I do on the outside of my home? Regularly fix the edges of walkways and driveways and fix any cracks. Remove anything that might make you trip as you walk through a door, such as a raised step or threshold. Trim any bushes or trees on the path to your home. Use bright outdoor lighting. Clear any walking paths of anything that might make someone trip, such as rocks or tools. Regularly check to see if handrails are loose or broken. Make sure that both sides of any steps have handrails. Any raised decks and porches should have guardrails on the edges. Have any leaves, snow, or ice cleared regularly. Use sand or salt on walking paths during winter. Clean up any spills in your garage right away. This includes oil or grease spills. What can I do in the bathroom? Use night lights. Install grab bars by the toilet and in the tub and shower. Do not use towel bars as grab bars. Use non-skid mats or decals in the tub or  shower. If you need to sit down in the shower, use a plastic, non-slip stool. Keep the floor dry. Clean up any water that spills on the floor as soon as it happens. Remove soap buildup in the tub or shower regularly. Attach bath mats securely with double-sided non-slip rug tape. Do not have throw rugs and other things on the floor that can make you trip. What can I do in the bedroom? Use night lights. Make sure that you have a light by your bed that is easy to reach. Do not use any sheets or blankets that are too big for your bed. They should not hang down onto the floor. Have a firm chair that has side arms. You can use this for support while you get dressed. Do not have throw rugs and other things on the floor that can make you trip. What can I do in the kitchen? Clean up any spills right away. Avoid walking on wet floors. Keep items that you use a lot in easy-to-reach places. If you need to reach something above you, use a strong step stool that has a grab bar. Keep electrical cords out of the way. Do  not use floor polish or wax that makes floors slippery. If you must use wax, use non-skid floor wax. Do not have throw rugs and other things on the floor that can make you trip. What can I do with my stairs? Do not leave any items on the stairs. Make sure that there are handrails on both sides of the stairs and use them. Fix handrails that are broken or loose. Make sure that handrails are as long as the stairways. Check any carpeting to make sure that it is firmly attached to the stairs. Fix any carpet that is loose or worn. Avoid having throw rugs at the top or bottom of the stairs. If you do have throw rugs, attach them to the floor with carpet tape. Make sure that you have a light switch at the top of the stairs and the bottom of the stairs. If you do not have them, ask someone to add them for you. What else can I do to help prevent falls? Wear shoes that: Do not have high heels. Have  rubber bottoms. Are comfortable and fit you well. Are closed at the toe. Do not wear sandals. If you use a stepladder: Make sure that it is fully opened. Do not climb a closed stepladder. Make sure that both sides of the stepladder are locked into place. Ask someone to hold it for you, if possible. Clearly mark and make sure that you can see: Any grab bars or handrails. First and last steps. Where the edge of each step is. Use tools that help you move around (mobility aids) if they are needed. These include: Canes. Walkers. Scooters. Crutches. Turn on the lights when you go into a dark area. Replace any light bulbs as soon as they burn out. Set up your furniture so you have a clear path. Avoid moving your furniture around. If any of your floors are uneven, fix them. If there are any pets around you, be aware of where they are. Review your medicines with your doctor. Some medicines can make you feel dizzy. This can increase your chance of falling. Ask your doctor what other things that you can do to help prevent falls. This information is not intended to replace advice given to you by your health care provider. Make sure you discuss any questions you have with your health care provider. Document Released: 04/28/2009 Document Revised: 12/08/2015 Document Reviewed: 08/06/2014 Elsevier Interactive Patient Education  2017 [employer].

## 2023-02-25 ENCOUNTER — Encounter: Payer: Self-pay | Admitting: Dermatology

## 2023-02-25 ENCOUNTER — Ambulatory Visit: Payer: Medicare Other | Admitting: Dermatology

## 2023-02-25 VITALS — BP 115/76 | HR 82

## 2023-02-25 DIAGNOSIS — L578 Other skin changes due to chronic exposure to nonionizing radiation: Secondary | ICD-10-CM | POA: Diagnosis not present

## 2023-02-25 DIAGNOSIS — L821 Other seborrheic keratosis: Secondary | ICD-10-CM | POA: Diagnosis not present

## 2023-02-25 DIAGNOSIS — L820 Inflamed seborrheic keratosis: Secondary | ICD-10-CM | POA: Diagnosis not present

## 2023-02-25 DIAGNOSIS — Z872 Personal history of diseases of the skin and subcutaneous tissue: Secondary | ICD-10-CM | POA: Diagnosis not present

## 2023-02-25 DIAGNOSIS — W908XXA Exposure to other nonionizing radiation, initial encounter: Secondary | ICD-10-CM | POA: Diagnosis not present

## 2023-02-25 DIAGNOSIS — L570 Actinic keratosis: Secondary | ICD-10-CM | POA: Diagnosis not present

## 2023-02-25 NOTE — Progress Notes (Signed)
Follow-Up Visit   Subjective  Regina Nash is a 72 y.o. female who presents for the following: Spots. Recheck ISK's treated at last visit 12/05/2022 at right cheek, left superior shoulder, right lateral breast, left popliteal fossa. States these lesions have resolved.   Right posterior leg, left upper arm, forearms, chest. Raised areas. Itches at times. Would like checked today and possibly treated.   Would like recommendation for a good lightweight moisturizer. Has used Eucerin, [employer], [id number].  Recheck nose. Has not started using 5FU for nose yet. Concerned with contraindications listed. Discussed with PCP and he recommended discussing with dermatologist.   The patient has spots, moles and lesions to be evaluated, some may be new or changing and the patient may have concern these could be cancer.   The following portions of the chart were reviewed this encounter and updated as appropriate: medications, allergies, medical history  Review of Systems:  No other skin or systemic complaints except as noted in HPI or Assessment and Plan.   Objective  Well appearing patient in no apparent distress; mood and affect are within normal limits.  A focused examination was performed of the following areas: Face, arms, right leg  Relevant physical exam findings are noted in the Assessment and Plan.  Left Lateral Thigh x1, right posterior leg x1, right proximal extensor forearm x1 (2) Erythematous keratotic or waxy stuck-on papule or plaque.  Nasal dorsum x1 Pink thin macules with gritty scale.     Assessment & Plan   Inflamed seborrheic keratosis (2) Left Lateral Thigh x1, right posterior leg x1, right proximal extensor forearm x1  Symptomatic, irritating, patient would like treated.  May require additional LN2 treatment.   Destruction of lesion - Left Lateral Thigh x1, right posterior leg x1, right proximal extensor forearm x1 (2)  Destruction method: cryotherapy   Informed  consent: discussed and consent obtained   Lesion destroyed using liquid nitrogen: Yes   Region frozen until ice ball extended beyond lesion: Yes   Cryotherapy cycles:  1 Outcome: patient tolerated procedure well with no complications   Post-procedure details: wound care instructions given   Additional details:  Prior to procedure, discussed risks of blister formation, small wound, skin dyspigmentation, or rare scar following cryotherapy. Recommend Vaseline ointment to treated areas while healing.   AK (actinic keratosis) Nasal dorsum x1  Patient prefers to avoid fluorouracil given concerns about side effects  Actinic keratoses are precancerous spots that appear secondary to cumulative UV radiation exposure/sun exposure over time. They are chronic with expected duration over 1 year. A portion of actinic keratoses will progress to squamous cell carcinoma of the skin. It is not possible to reliably predict which spots will progress to skin cancer and so treatment is recommended to prevent development of skin cancer.  Recommend daily broad spectrum sunscreen SPF 30+ to sun-exposed areas, reapply every 2 hours as needed.  Recommend staying in the shade or wearing long sleeves, sun glasses (UVA+UVB protection) and wide brim hats (4-inch brim around the entire circumference of the hat). Call for new or changing lesions.  Destruction of lesion - Nasal dorsum x1  Destruction method: cryotherapy   Informed consent: discussed and consent obtained   Lesion destroyed using liquid nitrogen: Yes   Region frozen until ice ball extended beyond lesion: Yes   Cryotherapy cycles:  1 Outcome: patient tolerated procedure well with no complications   Post-procedure details: wound care instructions given   Additional details:  Prior to procedure, discussed risks of  blister formation, small wound, skin dyspigmentation, or rare scar following cryotherapy. Recommend Vaseline ointment to treated areas while healing.     HISTORY OF INFLAMED SEBORRHEIC KERATOSIS right cheek, left superior shoulder, right lateral breast, left popliteal fossa.  - clear today - observe for recurrence   SEBORRHEIC KERATOSIS - Stuck-on, waxy, tan-brown papules and/or plaques  - Benign-appearing - Discussed benign etiology and prognosis. - Observe - Call for any changes  ACTINIC DAMAGE - chronic, secondary to cumulative UV radiation exposure/sun exposure over time - diffuse scaly erythematous macules with underlying dyspigmentation - Recommend daily broad spectrum sunscreen SPF 30+ to sun-exposed areas, reapply every 2 hours as needed.  - Recommend staying in the shade or wearing long sleeves, sun glasses (UVA+UVB protection) and wide brim hats (4-inch brim around the entire circumference of the hat). - Call for new or changing lesions.   Return for TBSE As Scheduled.  I, Lawson Radar, CMA, am acting as scribe for Elie Goody, MD.   Documentation: I have reviewed the above documentation for accuracy and completeness, and I agree with the above.  Elie Goody, MD

## 2023-02-25 NOTE — Patient Instructions (Addendum)
Cryotherapy Aftercare  Wash gently with soap and water everyday.   Apply Vaseline and Band-Aid daily until healed.     Recommendations to help smooth seborrheic keratoses:   Recommend starting moisturizer with exfoliant (Urea, Salicylic acid, or Lactic acid) one to two times daily to help smooth rough and bumpy skin.  OTC options include Cetaphil Rough and Bumpy lotion (Urea), Eucerin Roughness Relief lotion or spot treatment cream (Urea), CeraVe SA lotion/cream for Rough and Bumpy skin (Sal Acid), Gold Bond Rough and Bumpy cream (Sal Acid), and AmLactin 12% lotion/cream (Lactic Acid).  If applying in morning, also apply sunscreen to sun-exposed areas, since these exfoliating moisturizers can increase sensitivity to sun.   Seborrheic Keratosis  What causes seborrheic keratoses? Seborrheic keratoses are harmless, common skin growths that first appear during adult life.  As time goes by, more growths appear.  Some people may develop a large number of them.  Seborrheic keratoses appear on both covered and uncovered body parts.  They are not caused by sunlight.  The tendency to develop seborrheic keratoses can be inherited.  They vary in color from skin-colored to gray, brown, or even black.  They can be either smooth or have a rough, warty surface.   Seborrheic keratoses are superficial and look as if they were stuck on the skin.  Under the microscope this type of keratosis looks like layers upon layers of skin.  That is why at times the top layer may seem to fall off, but the rest of the growth remains and re-grows.    Treatment Seborrheic keratoses do not need to be treated, but can easily be removed in the office.  Seborrheic keratoses often cause symptoms when they rub on clothing or jewelry.  Lesions can be in the way of shaving.  If they become inflamed, they can cause itching, soreness, or burning.  Removal of a seborrheic keratosis can be accomplished by freezing, burning, or surgery. If any  spot bleeds, scabs, or grows rapidly, please return to have it checked, as these can be an indication of a skin cancer.  Recommend Cetaphil line of moisturizers.     Gentle Skin Care Guide  1. Bathe no more than once a day.  2. Avoid bathing in hot water  3. Use a mild soap like Dove, Vanicream, Cetaphil, CeraVe. Can use Lever 2000 or Cetaphil antibacterial soap  4. Use soap only where you need it. On most days, use it under your arms, between your legs, and on your feet. Let the water rinse other areas unless visibly dirty.  5. When you get out of the bath/shower, use a towel to gently blot your skin dry, don't rub it.  6. While your skin is still a little damp, apply a moisturizing cream such as Vanicream, CeraVe, Cetaphil, Eucerin, Sarna lotion or plain Vaseline Jelly. For hands apply Neutrogena [place] Hand Cream or Excipial Hand Cream.  7. Reapply moisturizer any time you start to itch or feel dry.  8. Sometimes using free and clear laundry detergents can be helpful. Fabric softener sheets should be avoided. Downy Free & Gentle liquid, or any liquid fabric softener that is free of dyes and perfumes, it acceptable to use  9. If your doctor has given you prescription creams you may apply moisturizers over them

## 2023-03-05 DIAGNOSIS — R0602 Shortness of breath: Secondary | ICD-10-CM | POA: Diagnosis not present

## 2023-03-05 DIAGNOSIS — G4733 Obstructive sleep apnea (adult) (pediatric): Secondary | ICD-10-CM | POA: Diagnosis not present

## 2023-03-06 ENCOUNTER — Telehealth: Payer: Self-pay | Admitting: Family Medicine

## 2023-03-06 DIAGNOSIS — R0602 Shortness of breath: Secondary | ICD-10-CM | POA: Diagnosis not present

## 2023-03-06 DIAGNOSIS — G4733 Obstructive sleep apnea (adult) (pediatric): Secondary | ICD-10-CM | POA: Diagnosis not present

## 2023-03-06 NOTE — Telephone Encounter (Signed)
Karie Soda from [facility] called stating the provider ordered a sleep study and she want to know if he will follow up on it or do the pt need to see [facility]. Tresa Endo stated the heart doctor sent the referral. Tresa Endo also stated the cma can reach her in epic

## 2023-03-06 NOTE — Telephone Encounter (Signed)
I sent a message to Karie Soda at [facility] to let her know Dr. Birdie Sons can follow up on the sleep study as long as the Pulmonologist read it.

## 2023-03-06 NOTE — Telephone Encounter (Signed)
I can follow-up on the sleep study as long as it is read by pulmonologist.

## 2023-03-15 ENCOUNTER — Institutional Professional Consult (permissible substitution): Payer: Medicare Other | Admitting: Internal Medicine

## 2023-03-15 ENCOUNTER — Telehealth: Payer: Self-pay | Admitting: Family Medicine

## 2023-03-15 NOTE — Telephone Encounter (Signed)
Please let the patient know that her sleep study confirmed sleep apnea.  I am going to sign orders for a CPAP.  Somebody should be contacting her to set this up.

## 2023-03-19 NOTE — Telephone Encounter (Signed)
Spoke to Patient and she is aware of the CPAP.

## 2023-04-18 DIAGNOSIS — H40013 Open angle with borderline findings, low risk, bilateral: Secondary | ICD-10-CM | POA: Diagnosis not present

## 2023-05-06 ENCOUNTER — Ambulatory Visit: Payer: Medicare Other | Admitting: Dermatology

## 2023-05-06 ENCOUNTER — Encounter: Payer: Self-pay | Admitting: Dermatology

## 2023-05-06 DIAGNOSIS — L821 Other seborrheic keratosis: Secondary | ICD-10-CM

## 2023-05-06 DIAGNOSIS — L918 Other hypertrophic disorders of the skin: Secondary | ICD-10-CM

## 2023-05-06 DIAGNOSIS — L570 Actinic keratosis: Secondary | ICD-10-CM

## 2023-05-06 DIAGNOSIS — L739 Follicular disorder, unspecified: Secondary | ICD-10-CM | POA: Diagnosis not present

## 2023-05-06 DIAGNOSIS — W908XXA Exposure to other nonionizing radiation, initial encounter: Secondary | ICD-10-CM | POA: Diagnosis not present

## 2023-05-06 DIAGNOSIS — L820 Inflamed seborrheic keratosis: Secondary | ICD-10-CM

## 2023-05-06 NOTE — Patient Instructions (Signed)
Cryotherapy Aftercare  Wash gently with soap and water everyday.   Apply Vaseline and Band-Aid daily until healed.   Recommend daily broad spectrum sunscreen SPF 30+ to sun-exposed areas, reapply every 2 hours as needed. Call for new or changing lesions.  Staying in the shade or wearing long sleeves, sun glasses (UVA+UVB protection) and wide brim hats (4-inch brim around the entire circumference of the hat) are also recommended for sun protection.    Due to recent changes in healthcare laws, you may see results of your pathology and/or laboratory studies on MyChart before the doctors have had a chance to review them. We understand that in some cases there may be results that are confusing or concerning to you. Please understand that not all results are received at the same time and often the doctors may need to interpret multiple results in order to provide you with the best plan of care or course of treatment. Therefore, we ask that you please give [place] 2 business days to thoroughly review all your results before contacting the office for clarification. Should we see a critical lab result, you will be contacted sooner.   If You Need Anything After Your Visit  If you have any questions or concerns for your doctor, please call our main line at [phone] and press option 4 to reach your doctor's medical assistant. If no one answers, please leave a voicemail as directed and we will return your call as soon as possible. Messages left after 4 pm will be answered the following business day.   You may also send [place] a message via MyChart. We typically respond to MyChart messages within 1-2 business days.  For prescription refills, please ask your pharmacy to contact our office. Our fax number is [phone].  If you have an urgent issue when the clinic is closed that cannot wait until the next business day, you can page your doctor at the number below.    Please note that while we do our best to be  available for urgent issues outside of office hours, we are not available 24/7.   If you have an urgent issue and are unable to reach [place], you may choose to seek medical care at your doctor's office, retail clinic, urgent care center, or emergency room.  If you have a medical emergency, please immediately call 911 or go to the emergency department.  Pager Numbers  - Dr. Gwen Pounds: [phone]  - Dr. Roseanne Reno: [phone]  - Dr. Katrinka Blazing: [phone]   In the event of inclement weather, please call our main line at [phone] for an update on the status of any delays or closures.  Dermatology Medication Tips: Please keep the boxes that topical medications come in in order to help keep track of the instructions about where and how to use these. Pharmacies typically print the medication instructions only on the boxes and not directly on the medication tubes.   If your medication is too expensive, please contact our office at [phone] option 4 or send [place] a message through MyChart.   We are unable to tell what your co-pay for medications will be in advance as this is different depending on your insurance coverage. However, we may be able to find a substitute medication at lower cost or fill out paperwork to get insurance to cover a needed medication.   If a prior authorization is required to get your medication covered by your insurance company, please allow [place] 1-2 business days to complete this process.  Drug prices often vary depending on where the prescription is filled and some pharmacies may offer cheaper prices.  The website www.goodrx.com contains coupons for medications through different pharmacies. The prices here do not account for what the cost may be with help from insurance (it may be cheaper with your insurance), but the website can give you the price if you did not use any insurance.  - You can print the associated coupon and take it with your prescription to the pharmacy.   - You may also stop by our office during regular business hours and pick up a GoodRx coupon card.  - If you need your prescription sent electronically to a different pharmacy, notify our office through [facility] or by phone at [phone] option 4.

## 2023-05-06 NOTE — Progress Notes (Signed)
Follow-Up Visit   Subjective  Regina Nash is a 72 y.o. female who presents for the following: AK treated at nasal dorsum with LN2 8/12 is still not smooth. Patient with a spot at left chest, getting larger, 2 itchy spots at back and a few spots at lower legs that start pink and meaty, do hurt and then dry up. Spots at back of neck that feel like pimples.  The patient has spots, moles and lesions to be evaluated, some may be new or changing and the patient may have concern these could be cancer.   The following portions of the chart were reviewed this encounter and updated as appropriate: medications, allergies, medical history  Review of Systems:  No other skin or systemic complaints except as noted in HPI or Assessment and Plan.  Objective  Well appearing patient in no apparent distress; mood and affect are within normal limits.   A focused examination was performed of the following areas: Face, chest, back, lower legs, neck and scalp  Relevant exam findings are noted in the Assessment and Plan.  Left Paraspinal Upper Back x 1, anterior neck x 1 (2) Erythematous stuck-on, waxy papule or plaque  nasal dorsum Erythematous thin papules/macules with gritty scale.     Assessment & Plan   SEBORRHEIC KERATOSIS - Stuck-on, waxy, tan-brown papules and/or plaques  - Benign-appearing - Discussed benign etiology and prognosis. - Observe - Call for any changes  Inflamed seborrheic keratosis (2) Left Paraspinal Upper Back x 1, anterior neck x 1  Destruction of lesion - Left Paraspinal Upper Back x 1, anterior neck x 1 (2) Complexity: simple   Destruction method: cryotherapy   Informed consent: discussed and consent obtained   Timeout:  patient name, date of birth, surgical site, and procedure verified Lesion destroyed using liquid nitrogen: Yes   Region frozen until ice ball extended beyond lesion: Yes   Cryotherapy cycles:  2 (1 or 2) Outcome: patient tolerated procedure well  with no complications   Post-procedure details: wound care instructions given    AK (actinic keratosis) nasal dorsum  Actinic keratoses are precancerous spots that appear secondary to cumulative UV radiation exposure/sun exposure over time. They are chronic with expected duration over 1 year. A portion of actinic keratoses will progress to squamous cell carcinoma of the skin. It is not possible to reliably predict which spots will progress to skin cancer and so treatment is recommended to prevent development of skin cancer.  Recommend daily broad spectrum sunscreen SPF 30+ to sun-exposed areas, reapply every 2 hours as needed.  Recommend staying in the shade or wearing long sleeves, sun glasses (UVA+UVB protection) and wide brim hats (4-inch brim around the entire circumference of the hat). Call for new or changing lesions.   Destruction of lesion - nasal dorsum Complexity: simple   Destruction method: cryotherapy   Informed consent: discussed and consent obtained   Timeout:  patient name, date of birth, surgical site, and procedure verified Lesion destroyed using liquid nitrogen: Yes   Region frozen until ice ball extended beyond lesion: Yes   Cryotherapy cycles:  2 (1 or 2) Outcome: patient tolerated procedure well with no complications   Post-procedure details: wound care instructions given    Acrochordons (Skin Tags) - Fleshy, skin-colored pedunculated papules at neck - Benign appearing.  - Observe. - If desired, they can be removed with an in office procedure that is not covered by insurance. - Please call the clinic if you notice any new or  changing lesions. - Patient will make appt to treat cosmetically  FOLLICULITIS Exam: Perifollicular erythematous papules and pustules at post neck/hairline  Folliculitis occurs due to inflammation of the superficial hair follicle (pore), resulting in acne-like lesions (pus bumps). It can be infectious (bacterial, fungal) or noninfectious  (shaving, tight clothing, heat/sweat, medications).  Folliculitis can be acute or chronic and recommended treatment depends on the underlying cause of folliculitis.  Treatment Plan: Deferred today   Return for TBSE, AK follow up, as scheduled.  Anise Salvo, RMA, am acting as scribe for Elie Goody, MD .   Documentation: I have reviewed the above documentation for accuracy and completeness, and I agree with the above.  Elie Goody, MD

## 2023-05-17 ENCOUNTER — Ambulatory Visit: Payer: Medicare Other | Admitting: Family Medicine

## 2023-06-03 ENCOUNTER — Ambulatory Visit (INDEPENDENT_AMBULATORY_CARE_PROVIDER_SITE_OTHER): Payer: Medicare Other | Admitting: Family Medicine

## 2023-06-03 ENCOUNTER — Encounter: Payer: Self-pay | Admitting: Family Medicine

## 2023-06-03 VITALS — BP 116/76 | HR 87 | Temp 98.0°F | Ht 64.5 in | Wt 167.4 lb

## 2023-06-03 DIAGNOSIS — R195 Other fecal abnormalities: Secondary | ICD-10-CM

## 2023-06-03 DIAGNOSIS — F331 Major depressive disorder, recurrent, moderate: Secondary | ICD-10-CM

## 2023-06-03 DIAGNOSIS — G4730 Sleep apnea, unspecified: Secondary | ICD-10-CM

## 2023-06-03 DIAGNOSIS — K8020 Calculus of gallbladder without cholecystitis without obstruction: Secondary | ICD-10-CM | POA: Diagnosis not present

## 2023-06-03 DIAGNOSIS — Z720 Tobacco use: Secondary | ICD-10-CM

## 2023-06-04 NOTE — Progress Notes (Signed)
Marikay Alar, MD Phone: [phone]  Regina Nash is a 72 y.o. female who presents today for follow-up.  OSA: Patient has not gotten her CPAP yet.  She notes her CPAP order was sent to a place in [place] and she is unable to travel that far to pick it up.  She wonders about getting a CPAP from a closer supplier.  Reports her sleep study was done through [facility].  Continues to have lots of fatigue.  Positive Cologuard: Patient has not yet had a colonoscopy.  Cholelithiasis: Patient has cut out meats completely.  She is mostly eating vegetable soup that she makes at home.  She feels more tired than she should be and thinks this could be related to lack of protein intake.  Patient is still not sure when she would have surgery.  She notes she needs to get the OSA under control prior to proceeding.  Patient ports the surgeon advised her that she should not quit smoking until after the surgery.  She is smoking 2.5 cigarettes/day.  Depression: This is an ongoing issue.  She is dealing with a lot within her life with her husband's medical issues.  She does not want any medications for this at this time.  No SI.  Social History   Tobacco Use  Smoking Status Every Day   Current packs/day: 0.50   Types: Cigarettes  Smokeless Tobacco Never  Tobacco Comments   Smokes 5 half cigarettes daily.     Current Outpatient Medications on File Prior to Visit  Medication Sig Dispense Refill   aspirin EC 81 MG tablet Take 1 tablet (81 mg total) by mouth daily. Swallow whole. 90 tablet 3   omeprazole (PRILOSEC) 20 MG capsule Take 20 mg by mouth daily.     propranolol (INDERAL) 10 MG tablet Take 1 tablet (10 mg total) by mouth 3 (three) times daily as needed. 60 tablet 3   No current facility-administered medications on file prior to visit.     ROS see history of present illness  Objective  Physical Exam Vitals:   06/03/23 1443  BP: 116/76  Pulse: 87  Temp: 98 F (36.7 C)   SpO2: 98%    BP Readings from Last 3 Encounters:  06/03/23 116/76  02/25/23 115/76  01/14/23 138/82   Wt Readings from Last 3 Encounters:  06/03/23 167 lb 6.4 oz (75.9 kg)  02/04/23 168 lb (76.2 kg)  01/14/23 173 lb 3.2 oz (78.6 kg)    Physical Exam Constitutional:      General: She is not in acute distress.    Appearance: She is not diaphoretic.  Cardiovascular:     Rate and Rhythm: Normal rate and regular rhythm.     Heart sounds: Normal heart sounds.  Pulmonary:     Effort: Pulmonary effort is normal.     Breath sounds: Normal breath sounds.  Skin:    General: Skin is warm and dry.  Neurological:     Mental Status: She is alert.      Assessment/Plan: Please see individual problem list.  Sleep apnea, unspecified type Assessment & Plan: Chronic issue.  We will check with our referral coordinator to see if there is somewhere else we can send her CPAP orders.   Calculus of gallbladder without cholecystitis without obstruction Assessment & Plan: Patient is still deciding whether or not she wants surgery for this.  Discussed she could certainly increase her lean protein intake.  Discussed trying to avoid fatty foods as this can  trigger gallbladder attacks.  Patient will contact her surgeon when she is ready to schedule.  Will work on getting her sleep apnea under control as well.   Moderate episode of recurrent major depressive disorder [facility]) Assessment & Plan: Chronic issue.  Generally stable.  She declines medication management at this time.  She will monitor.   Positive colorectal cancer screening using Cologuard test Assessment & Plan: Discussed the need to have a colonoscopy.  Advised that she should have this done sooner rather than later.  She will let us know when she is ready to see GI.   Tobacco abuse Assessment & Plan: Encouraged smoking cessation.     Return in about 3 months (around 09/03/2023) for transfer of care, OSA.   Marikay Alar,  MD [facility]

## 2023-06-04 NOTE — Assessment & Plan Note (Signed)
Patient is still deciding whether or not she wants surgery for this.  Discussed she could certainly increase her lean protein intake.  Discussed trying to avoid fatty foods as this can trigger gallbladder attacks.  Patient will contact her surgeon when she is ready to schedule.  Will work on getting her sleep apnea under control as well.

## 2023-06-04 NOTE — Assessment & Plan Note (Signed)
Discussed the need to have a colonoscopy.  Advised that she should have this done sooner rather than later.  She will let us know when she is ready to see GI.

## 2023-06-04 NOTE — Assessment & Plan Note (Signed)
Chronic issue.  Generally stable.  She declines medication management at this time.  She will monitor.

## 2023-06-04 NOTE — Assessment & Plan Note (Signed)
Encouraged smoking cessation 

## 2023-06-04 NOTE — Assessment & Plan Note (Signed)
Chronic issue.  We will check with our referral coordinator to see if there is somewhere else we can send her CPAP orders.

## 2023-06-15 ENCOUNTER — Observation Stay
Admission: EM | Admit: 2023-06-15 | Discharge: 2023-06-16 | Disposition: A | Discharge status: Home / Self Care | Payer: Medicare Other | Attending: General Surgery | Admitting: General Surgery

## 2023-06-15 ENCOUNTER — Other Ambulatory Visit: Payer: Self-pay

## 2023-06-15 ENCOUNTER — Inpatient Hospital Stay: Payer: Medicare Other | Admitting: General Practice

## 2023-06-15 ENCOUNTER — Encounter
Admission: EM | Disposition: A | Discharge status: Home / Self Care | Payer: Self-pay | Source: Home / Self Care | Attending: Emergency Medicine

## 2023-06-15 ENCOUNTER — Emergency Department: Payer: Medicare Other

## 2023-06-15 ENCOUNTER — Encounter: Payer: Self-pay | Admitting: Emergency Medicine

## 2023-06-15 DIAGNOSIS — F17210 Nicotine dependence, cigarettes, uncomplicated: Secondary | ICD-10-CM | POA: Insufficient documentation

## 2023-06-15 DIAGNOSIS — Z7982 Long term (current) use of aspirin: Secondary | ICD-10-CM | POA: Insufficient documentation

## 2023-06-15 DIAGNOSIS — K8020 Calculus of gallbladder without cholecystitis without obstruction: Secondary | ICD-10-CM | POA: Diagnosis not present

## 2023-06-15 DIAGNOSIS — R1111 Vomiting without nausea: Secondary | ICD-10-CM | POA: Diagnosis not present

## 2023-06-15 DIAGNOSIS — K8012 Calculus of gallbladder with acute and chronic cholecystitis without obstruction: Principal | ICD-10-CM | POA: Insufficient documentation

## 2023-06-15 DIAGNOSIS — K819 Cholecystitis, unspecified: Secondary | ICD-10-CM

## 2023-06-15 DIAGNOSIS — K8000 Calculus of gallbladder with acute cholecystitis without obstruction: Secondary | ICD-10-CM | POA: Diagnosis not present

## 2023-06-15 DIAGNOSIS — K810 Acute cholecystitis: Secondary | ICD-10-CM | POA: Diagnosis not present

## 2023-06-15 DIAGNOSIS — K812 Acute cholecystitis with chronic cholecystitis: Secondary | ICD-10-CM

## 2023-06-15 DIAGNOSIS — Z79899 Other long term (current) drug therapy: Secondary | ICD-10-CM | POA: Insufficient documentation

## 2023-06-15 DIAGNOSIS — N189 Chronic kidney disease, unspecified: Secondary | ICD-10-CM | POA: Insufficient documentation

## 2023-06-15 DIAGNOSIS — J45909 Unspecified asthma, uncomplicated: Secondary | ICD-10-CM | POA: Diagnosis not present

## 2023-06-15 DIAGNOSIS — I129 Hypertensive chronic kidney disease with stage 1 through stage 4 chronic kidney disease, or unspecified chronic kidney disease: Secondary | ICD-10-CM | POA: Insufficient documentation

## 2023-06-15 DIAGNOSIS — E785 Hyperlipidemia, unspecified: Secondary | ICD-10-CM | POA: Diagnosis not present

## 2023-06-15 DIAGNOSIS — K8010 Calculus of gallbladder with chronic cholecystitis without obstruction: Secondary | ICD-10-CM | POA: Diagnosis not present

## 2023-06-15 DIAGNOSIS — R197 Diarrhea, unspecified: Secondary | ICD-10-CM | POA: Diagnosis not present

## 2023-06-15 DIAGNOSIS — R1084 Generalized abdominal pain: Secondary | ICD-10-CM | POA: Diagnosis not present

## 2023-06-15 DIAGNOSIS — R1010 Upper abdominal pain, unspecified: Secondary | ICD-10-CM | POA: Diagnosis not present

## 2023-06-15 DIAGNOSIS — R1011 Right upper quadrant pain: Secondary | ICD-10-CM | POA: Diagnosis not present

## 2023-06-15 DIAGNOSIS — R110 Nausea: Secondary | ICD-10-CM | POA: Diagnosis not present

## 2023-06-15 HISTORY — DX: Cholecystitis, unspecified: K81.9

## 2023-06-15 LAB — COMPREHENSIVE METABOLIC PANEL
ALT: 20 U/L (ref 0–44)
AST: 23 U/L (ref 15–41)
Albumin: 4 g/dL (ref 3.5–5.0)
Alkaline Phosphatase: 88 U/L (ref 38–126)
Anion gap: 9 (ref 5–15)
BUN: 10 mg/dL (ref 8–23)
CO2: 26 mmol/L (ref 22–32)
Calcium: 9.1 mg/dL (ref 8.9–10.3)
Chloride: 101 mmol/L (ref 98–111)
Creatinine, Ser: 0.94 mg/dL (ref 0.44–1.00)
GFR, Estimated: >60 mL/min (ref >60)
Glucose, Bld: 135 mg/dL — ABNORMAL HIGH (ref 70–99)
Potassium: 3.7 mmol/L (ref 3.5–5.1)
Sodium: 136 mmol/L (ref 135–145)
Total Bilirubin: 0.7 mg/dL (ref <1.2)
Total Protein: 7.5 g/dL (ref 6.5–8.1)

## 2023-06-15 LAB — URINALYSIS, ROUTINE W REFLEX MICROSCOPIC
Bacteria, UA: NONE SEEN
Bilirubin Urine: NEGATIVE
Glucose, UA: NEGATIVE mg/dL
Ketones, ur: NEGATIVE mg/dL
Leukocytes,Ua: NEGATIVE
Nitrite: NEGATIVE
Protein, ur: NEGATIVE mg/dL
Specific Gravity, Urine: 1.013 (ref 1.005–1.030)
pH: 7 (ref 5.0–8.0)

## 2023-06-15 LAB — CBC
HCT: 42.8 % (ref 36.0–46.0)
Hemoglobin: 14 g/dL (ref 12.0–15.0)
MCH: 30.6 pg (ref 26.0–34.0)
MCHC: 32.7 g/dL (ref 30.0–36.0)
MCV: 93.7 fL (ref 80.0–100.0)
Platelets: 230 10*3/uL (ref 150–400)
RBC: 4.57 MIL/uL (ref 3.87–5.11)
RDW: 11.9 % (ref 11.5–15.5)
WBC: 7.9 10*3/uL (ref 4.0–10.5)
nRBC: 0 % (ref 0.0–0.2)

## 2023-06-15 LAB — LIPASE, BLOOD: Lipase: 44 U/L (ref 11–51)

## 2023-06-15 LAB — HIV ANTIBODY (ROUTINE TESTING W REFLEX): HIV Screen 4th Generation wRfx: NONREACTIVE

## 2023-06-15 LAB — CBG MONITORING, ED
Glucose-Capillary: 111 mg/dL — ABNORMAL HIGH (ref 70–99)
Glucose-Capillary: 108 mg/dL — ABNORMAL HIGH (ref 70–99)

## 2023-06-15 SURGERY — CHOLECYSTECTOMY, ROBOT-ASSISTED, LAPAROSCOPIC
Anesthesia: General | Site: Abdomen

## 2023-06-15 MED ORDER — MIDAZOLAM HCL 2 MG/2ML IJ SOLN
INTRAMUSCULAR | Status: DC | PRN
  Administered 2023-06-15: 2 mg via INTRAVENOUS

## 2023-06-15 MED ORDER — SODIUM CHLORIDE 0.9 % IR SOLN
Status: DC | PRN
  Administered 2023-06-15: 1000 mL

## 2023-06-15 MED ORDER — PHENYLEPHRINE 80 MCG/ML (10ML) SYRINGE FOR IV PUSH (FOR BLOOD PRESSURE SUPPORT)
PREFILLED_SYRINGE | INTRAVENOUS | Status: AC
Start: 2023-06-15 — End: ?
  Filled 2023-06-15: qty 10

## 2023-06-15 MED ORDER — FENTANYL CITRATE (PF) 100 MCG/2ML IJ SOLN
25.0000 ug | INTRAMUSCULAR | Status: DC | PRN
Start: 2023-06-15 — End: 2023-06-15
  Administered 2023-06-15: 25 ug via INTRAVENOUS
  Administered 2023-06-15: 50 ug via INTRAVENOUS

## 2023-06-15 MED ORDER — PROPOFOL 1000 MG/100ML IV EMUL
INTRAVENOUS | Status: AC
  Filled 2023-06-15: qty 100

## 2023-06-15 MED ORDER — ONDANSETRON 4 MG PO TBDP: 4.0000 mg | ORAL_TABLET | Freq: Four times a day (QID) | ORAL | Status: DC | PRN

## 2023-06-15 MED ORDER — DEXAMETHASONE SODIUM PHOSPHATE 10 MG/ML IJ SOLN
INTRAMUSCULAR | Status: AC
  Filled 2023-06-15: qty 1

## 2023-06-15 MED ORDER — ONDANSETRON HCL 4 MG/2ML IJ SOLN
INTRAMUSCULAR | Status: AC
  Filled 2023-06-15: qty 2

## 2023-06-15 MED ORDER — BUPIVACAINE LIPOSOME 1.3 % IJ SUSP
INTRAMUSCULAR | Status: AC
Start: 2023-06-15 — End: ?
  Filled 2023-06-15: qty 10

## 2023-06-15 MED ORDER — PIPERACILLIN-TAZOBACTAM 3.375 G IVPB
3.3750 g | Freq: Three times a day (TID) | INTRAVENOUS | Status: DC
  Administered 2023-06-15: 3.375 g via INTRAVENOUS
  Filled 2023-06-15 (×2): qty 50

## 2023-06-15 MED ORDER — SUGAMMADEX SODIUM 200 MG/2ML IV SOLN
INTRAVENOUS | Status: DC | PRN
  Administered 2023-06-15: 200 mg via INTRAVENOUS

## 2023-06-15 MED ORDER — OXYCODONE HCL 5 MG PO TABS: 5.0000 mg | ORAL_TABLET | Freq: Once | ORAL | Status: DC | PRN

## 2023-06-15 MED ORDER — INDOCYANINE GREEN 25 MG IV SOLR
INTRAVENOUS | Status: AC
Start: 2023-06-15 — End: ?
  Filled 2023-06-15: qty 10

## 2023-06-15 MED ORDER — SODIUM CHLORIDE 0.9 % IV SOLN: INTRAVENOUS | Status: DC | PRN

## 2023-06-15 MED ORDER — FENTANYL CITRATE (PF) 100 MCG/2ML IJ SOLN
INTRAMUSCULAR | Status: DC | PRN
Start: 2023-06-15 — End: 2023-06-15
  Administered 2023-06-15: 25 ug via INTRAVENOUS
  Administered 2023-06-15 (×3): 50 ug via INTRAVENOUS
  Administered 2023-06-15: 25 ug via INTRAVENOUS

## 2023-06-15 MED ORDER — FENTANYL CITRATE (PF) 100 MCG/2ML IJ SOLN
INTRAMUSCULAR | Status: AC
  Filled 2023-06-15: qty 2

## 2023-06-15 MED ORDER — MORPHINE SULFATE (PF) 2 MG/ML IV SOLN: 2.0000 mg | INTRAVENOUS | Status: DC | PRN

## 2023-06-15 MED ORDER — BUPIVACAINE-EPINEPHRINE (PF) 0.5% -1:200000 IJ SOLN
INTRAMUSCULAR | Status: DC | PRN
  Administered 2023-06-15: 20 mL via INTRAMUSCULAR

## 2023-06-15 MED ORDER — OXYCODONE HCL 5 MG PO TABS: 5.0000 mg | ORAL_TABLET | ORAL | Status: DC | PRN

## 2023-06-15 MED ORDER — OXYCODONE HCL 5 MG/5ML PO SOLN: 5.0000 mg | Freq: Once | ORAL | Status: DC | PRN

## 2023-06-15 MED ORDER — PROPOFOL 10 MG/ML IV BOLUS
INTRAVENOUS | Status: AC
  Filled 2023-06-15: qty 20

## 2023-06-15 MED ORDER — BUPIVACAINE-EPINEPHRINE (PF) 0.5% -1:200000 IJ SOLN
INTRAMUSCULAR | Status: AC
  Filled 2023-06-15: qty 10

## 2023-06-15 MED ORDER — LIDOCAINE HCL (CARDIAC) PF 100 MG/5ML IV SOSY
PREFILLED_SYRINGE | INTRAVENOUS | Status: DC | PRN
  Administered 2023-06-15: 80 mg via INTRAVENOUS

## 2023-06-15 MED ORDER — LIDOCAINE HCL (PF) 2 % IJ SOLN
INTRAMUSCULAR | Status: AC
  Filled 2023-06-15: qty 5

## 2023-06-15 MED ORDER — LACTATED RINGERS IV SOLN: INTRAVENOUS | Status: DC

## 2023-06-15 MED ORDER — ONDANSETRON HCL 4 MG/2ML IJ SOLN
4.0000 mg | Freq: Four times a day (QID) | INTRAMUSCULAR | Status: DC | PRN
  Administered 2023-06-15: 4 mg via INTRAVENOUS

## 2023-06-15 MED ORDER — PHENYLEPHRINE 80 MCG/ML (10ML) SYRINGE FOR IV PUSH (FOR BLOOD PRESSURE SUPPORT)
PREFILLED_SYRINGE | INTRAVENOUS | Status: DC | PRN
Start: 2023-06-15 — End: 2023-06-15
  Administered 2023-06-15: 160 ug via INTRAVENOUS
  Administered 2023-06-15: 80 ug via INTRAVENOUS

## 2023-06-15 MED ORDER — ROCURONIUM BROMIDE 100 MG/10ML IV SOLN
INTRAVENOUS | Status: DC | PRN
  Administered 2023-06-15: 10 mg via INTRAVENOUS
  Administered 2023-06-15: 70 mg via INTRAVENOUS

## 2023-06-15 MED ORDER — ACETAMINOPHEN 500 MG PO TABS
1000.0000 mg | ORAL_TABLET | Freq: Four times a day (QID) | ORAL | Status: DC
  Administered 2023-06-15 – 2023-06-16 (×4): 1000 mg via ORAL
  Filled 2023-06-15 (×4): qty 2

## 2023-06-15 MED ORDER — LACTATED RINGERS IV BOLUS
1000.0000 mL | Freq: Once | INTRAVENOUS | Status: AC
  Administered 2023-06-15: 1000 mL via INTRAVENOUS

## 2023-06-15 MED ORDER — PIPERACILLIN-TAZOBACTAM 3.375 G IVPB
3.3750 g | Freq: Three times a day (TID) | INTRAVENOUS | Status: AC
  Administered 2023-06-15 – 2023-06-16 (×3): 3.375 g via INTRAVENOUS
  Filled 2023-06-15 (×3): qty 50

## 2023-06-15 MED ORDER — DEXAMETHASONE SODIUM PHOSPHATE 10 MG/ML IJ SOLN
INTRAMUSCULAR | Status: DC | PRN
  Administered 2023-06-15: 10 mg via INTRAVENOUS

## 2023-06-15 MED ORDER — ROCURONIUM BROMIDE 10 MG/ML (PF) SYRINGE
PREFILLED_SYRINGE | INTRAVENOUS | Status: AC
  Filled 2023-06-15: qty 10

## 2023-06-15 MED ORDER — 0.9 % SODIUM CHLORIDE (POUR BTL) OPTIME
TOPICAL | Status: DC | PRN
Start: 2023-06-15 — End: 2023-06-15
  Administered 2023-06-15: 500 mL

## 2023-06-15 MED ORDER — INDOCYANINE GREEN 25 MG IV SOLR
1.2500 mg | Freq: Once | INTRAVENOUS | Status: AC
  Administered 2023-06-15: 1.25 mg via TOPICAL
  Filled 2023-06-15: qty 10

## 2023-06-15 MED ORDER — LIDOCAINE HCL (PF) 2 % IJ SOLN
INTRAMUSCULAR | Status: AC
Start: 2023-06-15 — End: ?
  Filled 2023-06-15: qty 5

## 2023-06-15 MED ORDER — MIDAZOLAM HCL 2 MG/2ML IJ SOLN
INTRAMUSCULAR | Status: AC
Start: 2023-06-15 — End: ?
  Filled 2023-06-15: qty 2

## 2023-06-15 MED ORDER — DEXMEDETOMIDINE HCL IN NACL 80 MCG/20ML IV SOLN
INTRAVENOUS | Status: AC
  Filled 2023-06-15: qty 20

## 2023-06-15 MED ORDER — PROPOFOL 10 MG/ML IV BOLUS
INTRAVENOUS | Status: DC | PRN
Start: 2023-06-15 — End: 2023-06-15
  Administered 2023-06-15: 150 mg via INTRAVENOUS

## 2023-06-15 SURGICAL SUPPLY — 43 items
BAG PRESSURE INF REUSE 1000 (BAG) IMPLANT
CANNULA REDUCER 12-8 DVNC XI (CANNULA) ×1 IMPLANT
CAUTERY HOOK MNPLR 1.6 DVNC XI (INSTRUMENTS) ×1 IMPLANT
CLIP LIGATING HEMO O LOK GREEN (MISCELLANEOUS) ×1 IMPLANT
DERMABOND ADVANCED .7 DNX12 (GAUZE/BANDAGES/DRESSINGS) ×1 IMPLANT
DRAPE ARM DVNC X/XI (DISPOSABLE) ×4 IMPLANT
DRAPE COLUMN DVNC XI (DISPOSABLE) ×1 IMPLANT
ELECT REM PT RETURN 9FT ADLT (ELECTROSURGICAL) ×1
ELECTRODE REM PT RTRN 9FT ADLT (ELECTROSURGICAL) ×1 IMPLANT
FORCEPS BPLR 8 MD DVNC XI (FORCEP) IMPLANT
FORCEPS BPLR R/ABLATION 8 DVNC (INSTRUMENTS) ×1 IMPLANT
FORCEPS PROGRASP DVNC XI (FORCEP) ×1 IMPLANT
GLOVE BIOGEL PI IND STRL 7.5 (GLOVE) ×2 IMPLANT
GLOVE SURG SYN 7.0 (GLOVE) ×2 IMPLANT
GLOVE SURG SYN 7.0 PF PI (GLOVE) ×2 IMPLANT
GOWN STRL REUS W/ TWL LRG LVL3 (GOWN DISPOSABLE) ×3 IMPLANT
GRASPER SUT TROCAR 14GX15 (MISCELLANEOUS) ×1 IMPLANT
IRRIGATOR SUCT 8 DISP DVNC XI (IRRIGATION / IRRIGATOR) IMPLANT
IV NS 1000ML BAXH (IV SOLUTION) IMPLANT
KIT PINK PAD W/HEAD ARE REST (MISCELLANEOUS) ×1 IMPLANT
KIT PINK PAD W/HEAD ARM REST (MISCELLANEOUS) ×1 IMPLANT
LABEL OR SOLS (LABEL) ×1 IMPLANT
MANIFOLD NEPTUNE II (INSTRUMENTS) ×1 IMPLANT
NDL HYPO 22X1.5 SAFETY MO (MISCELLANEOUS) ×1 IMPLANT
NDL INSUFFLATION 14GA 120MM (NEEDLE) ×1 IMPLANT
NEEDLE HYPO 22X1.5 SAFETY MO (MISCELLANEOUS) ×1 IMPLANT
NEEDLE INSUFFLATION 14GA 120MM (NEEDLE) ×1 IMPLANT
NS IRRIG 500ML POUR BTL (IV SOLUTION) ×1 IMPLANT
OBTURATOR OPTICAL STND 8 DVNC (TROCAR) ×1
OBTURATOR OPTICALSTD 8 DVNC (TROCAR) ×1 IMPLANT
PACK LAP CHOLECYSTECTOMY (MISCELLANEOUS) ×1 IMPLANT
SEAL UNIV 5-12 XI (MISCELLANEOUS) ×4 IMPLANT
SET TUBE SMOKE EVAC HIGH FLOW (TUBING) ×1 IMPLANT
SOL ELECTROSURG ANTI STICK (MISCELLANEOUS) ×1
SOLUTION ELECTROSURG ANTI STCK (MISCELLANEOUS) ×1 IMPLANT
SPIKE FLUID TRANSFER (MISCELLANEOUS) ×2 IMPLANT
SPONGE T-LAP 4X18 ~~LOC~~+RFID (SPONGE) IMPLANT
SUT MNCRL 4-0 27XMFL (SUTURE) ×1
SUT VICRYL 0 UR6 27IN ABS (SUTURE) ×1 IMPLANT
SUTURE MNCRL 4-0 27XMF (SUTURE) ×1 IMPLANT
SYS BAG RETRIEVAL 10MM (BASKET) ×1
SYSTEM BAG RETRIEVAL 10MM (BASKET) ×1 IMPLANT
WATER STERILE IRR 500ML POUR (IV SOLUTION) ×1 IMPLANT

## 2023-06-15 NOTE — H&P (Signed)
Patient ID: Regina Nash, female   DOB: 25-May-1951, 72 y.o.   MRN: [id number] CC: RUQ Pain History of Present Illness Regina Nash is a 72 y.o. female with PMH significant for Sleep apnea and tachycarrhythmias who presents in consultation for right upper quadrant pain.  The patient reports that last night about 730 she had a taco for dinner.  She woke up at 11 with sharp right upper quadrant epigastric pain.  She says this radiated across her abdomen.  She denies having any nausea or vomiting.  She denies any fevers.  She said the pain persisted so she came to the emergency department for evaluation.  Here she had labs that were notable for a normal bilirubin and normal white count but she had an ultrasound that showed normal gallbladder well with a positive Murphy sign.  Of note, the patient was evaluated in May for the same problem.  It was recommended that she undergo a robotic assisted cholecystectomy but she needed to have a sleep study and see cardiology.  She reports no episodes since then.  She had her sleep study and has obstructive sleep apnea and is quite concerned about this.  She also has a history of tachyarrhythmias for which she was prescribed propranolol but she has not had to use it in some time because she has not had any episodes of palpitations.  She reports that she is able to walk around [place] and do her grocery shopping without getting short of breath.  Past Medical History Past Medical History:  Diagnosis Date   Allergy    Anxiety    Arrhythmia    Arthritis    Asthma    Chronic bronchitis (HCC)    Chronic kidney disease    Depression    Fatty liver    Gallstones    Genital warts    GERD (gastroesophageal reflux disease)    History of recurrent UTIs    Hyperglycemia    IBS (irritable bowel syndrome)    Migraine    PTSD (post-traumatic stress disorder)    Sleep apnea    Substance abuse (HCC)    Urine incontinence        Past Surgical History:  Procedure  Laterality Date   TUBAL LIGATION      Allergies  Allergen Reactions   Codeine Other (See Comments)   Erythromycin Other (See Comments)    abdominal pain   Penicillins     Current Facility-Administered Medications  Medication Dose Route Frequency Provider Last Rate Last Admin   acetaminophen (TYLENOL) tablet 1,000 mg  1,000 mg Oral Q6H Kandis Cocking, MD   1,000 mg at 06/15/23 6962   indocyanine green (IC-GREEN) injection 1.25 mg  1.25 mg Topical Once Kandis Cocking, MD       lactated ringers infusion   Intravenous Continuous Kandis Cocking, MD 75 mL/hr at 06/15/23 0634 New Bag at 06/15/23 0634   ondansetron (ZOFRAN-ODT) disintegrating tablet 4 mg  4 mg Oral Q6H PRN Kandis Cocking, MD       Or   ondansetron [facility]) injection 4 mg  4 mg Intravenous Q6H PRN Kandis Cocking, MD       oxyCODONE (Oxy IR/ROXICODONE) immediate release tablet 5-10 mg  5-10 mg Oral Q4H PRN Kandis Cocking, MD       piperacillin-tazobactam (ZOSYN) IVPB 3.375 g  3.375 g Intravenous Q8H Kandis Cocking, MD   Stopped at 06/15/23 [phone]   Current Outpatient Medications  Medication Sig Dispense Refill  omeprazole (PRILOSEC) 20 MG capsule Take 20 mg by mouth daily.     propranolol (INDERAL) 10 MG tablet Take 1 tablet (10 mg total) by mouth 3 (three) times daily as needed. 60 tablet 3   aspirin EC 81 MG tablet Take 1 tablet (81 mg total) by mouth daily. Swallow whole. (Patient not taking: Reported on 06/15/2023) 90 tablet 3    Family History Family History  Problem Relation Age of Onset   Alcoholism Other        Parent   Arthritis Other        Parent   Cancer Other        Breast, ovarian/uterine   Breast cancer Paternal Aunt        46's   Alcohol abuse Mother    Anxiety disorder Mother    Alcohol abuse Father    Anxiety disorder Sister    Anxiety disorder Sister    Depression Sister    Rheum arthritis Maternal Grandfather    Colon cancer Neg Hx        Social History Social History    Tobacco Use   Smoking status: Every Day    Current packs/day: 0.50    Types: Cigarettes   Smokeless tobacco: Never   Tobacco comments:    Smokes 5 half cigarettes daily.   Vaping Use   Vaping status: Never Used  Substance Use Topics   Alcohol use: Never    Alcohol/week: 0.0 standard drinks of alcohol   Drug use: No        ROS Full ROS of systems performed and is otherwise negative there than what is stated in the HPI  Physical Exam Blood pressure 124/79, pulse 60, temperature 98 F (36.7 C), temperature source Oral, resp. rate 18, last menstrual period 02/16/2010, SpO2 94%.  No acute distress, alert and oriented x 3, moving all extremities spontaneously, wears glasses, normal work of breathing on room air, regular rate and rhythm Abdomen is soft, nondistended.  There is some tenderness to deep palpation in the right upper quadrant without rebound tenderness or  Data Reviewed I independently reviewed her labs and these include a normal white count as well as a normal bilirubin.  Her ultrasound does show some stones in the gallbladder wall does not appear very thickened  I have personally reviewed the patient's imaging and medical records.    Assessment/Plan    The patient is 72 year old that has symptomatic cholelithiasis and I am concerned that she has early acute cholecystitis given that she has significant pain in the right upper quadrant.  She could also potentially have a chronic cholecystitis picture.  Given that she has had pain bad enough to have have her come to the emergency department I think that it is beneficial to proceed to the operating room for robotic assisted cholecystectomy.  She is quite nervous about her sleep apnea as well as her tachyarrhythmias but I discussed the risk, benefits and alternatives of the procedure.  We discussed the risk of infection, bleeding, bile leak, retained stones, injury to common bile duct and injury to surrounding areas.  I also  discussed risk of potential need for open surgery.  She understands these risks and wishes to proceed.  We will continue on antibiotics and give her ICG prior to initiation of the procedure.     Kandis Cocking 06/15/2023, 7:38 AM

## 2023-06-15 NOTE — Op Note (Signed)
Robotic assisted laparoscopic Cholecystectomy  Pre-operative Diagnosis: Acute CHolecystitis  Post-operative Diagnosis: Acute on chronic cholecystitis  Procedure:  Robotic assisted laparoscopic Cholecystectomy  Surgeon: Baker Pierini, MD  Anesthesia: Gen. with endotracheal tube  Findings: Acute on chronic cholecystitis with dense omental adhesions to the gallbladder wall.  Difficult dissection with perforation of the fundus and spillage of some stones in the abdominal cavity that were retrieved with stone grasper.  Unable to obtain true critical view of safety but did identify cystic duct with aid of ICG as well as seeing liver bed posterior to the cystic duct and the structure going directly into the gallbladder  Estimated Blood Loss: 20cc       Specimens: Gallbladder           Complications: none   Procedure Details  The patient was seen again in the Holding Room. The benefits, complications, treatment options, and expected outcomes were discussed with the patient. The risks of bleeding, infection, recurrence of symptoms, failure to resolve symptoms, bile duct damage, bile duct leak, retained common bile duct stone, bowel injury, any of which could require further surgery and/or ERCP, stent, or papillotomy were reviewed with the patient. The likelihood of improving the patient's symptoms with return to their baseline status is good.  The patient and/or family concurred with the proposed plan, giving informed consent.  The patient was taken to Operating Room, identified  and the procedure verified as robotic Cholecystectomy.  A Time Out was held and the above information confirmed.  Prior to the induction of general anesthesia, antibiotic prophylaxis was administered. VTE prophylaxis was in place. General endotracheal anesthesia was then administered and tolerated well. After the induction, the abdomen was prepped with Chloraprep and draped in the sterile fashion. The patient was  positioned in the supine position.  A veress needle was inserted into the abdomen using standard drop technique. An 8mm infra-umbilical robotic port was then placed under direct visualization. There was no injury noted at the site of veress needle insertion. Two right sided abdominal 8mm ports followed by an 12mm left abdominal robotic ports were placed under direct visualization.   The patient was positioned  in reverse Trendelenburg, robot was brought to the surgical field and docked in the standard fashion.  We made sure all the instrumentation was kept indirect view at all times and that there were no collision between the arms. I scrubbed out and went to the console.  The gallbladder was identified, there were dense adhesions to the gallbladder fundus.  The adhesions were taken down with electrocautery.  At this time the fundus was grasped.  This was attempted to retract over the dome of the liver but just with gentle retraction there was a perforation in the fundus of the gallbladder with bile spillage.  I was able to then grab that perforation and retract the fundus of the gallbladder over the dome of the liver.  There were very dense omental adhesions throughout the gallbladder wall.  I was able to sweep these down bluntly exposing the lower part of the gallbladder.  The infundibulum was then retracted laterally.  In a combination of blunt dissection and with hook cautery I was able to open the peritoneum laterally over the gallbladder and separate the superior part of the gallbladder from the gallbladder wall.  I then turned my to into the peritoneum over Calot's triangle.  That this dissection was hard I elected to only use blunt dissection.  The peritoneum was swept bluntly medially.  I  was able to identify the window medially that met up with my dissection of the peritoneum laterally.  This exposed the liver plate.  The cystic duct was then identified and cleared of overlying tissue.  I confirmed  that this was a biliary structure with ICG.  I then reviewed the inside of the gallbladder where the perforation was made and this was traced down to the structure I believed to be the cystic duct.  I was unable to identify a cystic artery but higher up on the gallbladder there was an arterial structure that had been ligated with cautery.  After completely clearing off the cystic duct I was confident that this went directly into the gallbladder.  I then clipped at high towards the infundibulum with 2 clips proximally and 1 clip distal.  This was ligated with cautery.  During the dissection there was spillage of several large stones.  These were collected and removed with a stone grasper.  The gallbladder was taken from the gallbladder fossa in a retrograde fashion with the electrocautery.  Hemostasis was achieved with the electrocautery. nspection of the right upper quadrant was performed. No bleeding, bile duct injury or leak, or bowel injury was noted. Robotic instruments and robotic arms were undocked in the standard fashion.  I scrubbed back in.  The gallbladder was removed and placed in an Endocatch bag.   The left lower quadrant fascia was then closed with a 0 vicryl using a suture needle passer. The pre-peritoneal space was then infiltrated with liposomal bupivicaine and marcaine solution. Pneumoperitoneum was released.  4-0 subcuticular Monocryl was used to close the skin. Dermabond was  applied.  The patient was then extubated and brought to the recovery room in stable condition. Sponge, lap, and needle counts were correct at closure and at the conclusion of the case.               Baker Pierini, M.D. [facility]

## 2023-06-15 NOTE — Anesthesia Postprocedure Evaluation (Signed)
Anesthesia Post Note  Patient: Saharrah Mizzi  Procedure(s) Performed: XI ROBOTIC ASSISTED LAPAROSCOPIC CHOLECYSTECTOMY (Abdomen) INDOCYANINE GREEN FLUORESCENCE IMAGING (ICG) (Abdomen)  Patient location during evaluation: PACU Anesthesia Type: General Level of consciousness: awake and alert Pain management: pain level controlled Vital Signs Assessment: post-procedure vital signs reviewed and stable Respiratory status: spontaneous breathing, nonlabored ventilation, respiratory function stable and patient connected to nasal cannula oxygen Cardiovascular status: blood pressure returned to baseline and stable Postop Assessment: no apparent nausea or vomiting Anesthetic complications: no  No notable events documented.   Last Vitals:  Vitals:   06/15/23 1300 06/15/23 1549  BP: (!) 113/58 (!) 110/56  Pulse: 68 64  Resp: 16 17  Temp: 36.7 C 36.9 C  SpO2: 97%     Last Pain:  Vitals:   06/15/23 1549  TempSrc: Oral  PainSc:                  Stephanie Coup

## 2023-06-15 NOTE — Anesthesia Procedure Notes (Signed)
Procedure Name: Intubation Date/Time: 06/15/2023 8:45 AM  Performed by: Karoline Caldwell, CRNAPre-anesthesia Checklist: Patient identified, Patient being monitored, Timeout performed, Emergency Drugs available and Suction available Patient Re-evaluated:Patient Re-evaluated prior to induction Oxygen Delivery Method: Circle system utilized Preoxygenation: Pre-oxygenation with 100% oxygen Induction Type: IV induction Ventilation: Mask ventilation without difficulty Laryngoscope Size: 3 and McGrath Grade View: Grade I Tube type: Oral Tube size: 7.0 mm Number of attempts: 1 Airway Equipment and Method: Stylet Placement Confirmation: ETT inserted through vocal cords under direct vision, positive ETCO2 and breath sounds checked- equal and bilateral Secured at: 21 cm Tube secured with: Tape Dental Injury: Teeth and Oropharynx as per pre-operative assessment

## 2023-06-15 NOTE — ED Notes (Addendum)
Pt dressed out of clothing. Pt given hospital gown and socks. Pt belongings placed at bedside.    Pt belongings Include: Pair of black socks Jeans Black shirt Orange undershirt Coat Pair of black shoes X1 black phone  Pt has silver ring on her left finger & a pair of glasses she is currently wearing.

## 2023-06-15 NOTE — ED Provider Notes (Signed)
[facility] Provider Note    Event Date/Time   First MD Initiated Contact with Patient 06/15/23 0138     (approximate)  History   Chief Complaint: Abdominal Pain  HPI  Regina Nash is a 72 y.o. female   with a past medical history of hypertension, sleep apnea, presents to the emergency department for right upper quadrant abdominal pain.  According to the patient since May she has been experiencing attacks of "gallbladder pain."  Patient followed up with surgery and they recommended she have her gallbladder out but they were trying to optimize her for surgery including treating her sleep apnea.  Patient states she has been hesitant to have the surgery.  She states tonight around 10 PM she developed again severe pain in the right upper quadrant that has been unrelenting ever since.  No fever.  States nausea but no vomiting.  No diarrhea.   Physical Exam   Triage Vital Signs: ED Triage Vitals  Encounter Vitals Group     BP 06/15/23 0025 (!) 144/86     Systolic BP Percentile --      Diastolic BP Percentile --      Pulse Rate 06/15/23 0025 73     Resp 06/15/23 0025 16     Temp 06/15/23 0025 97.8 F (36.6 C)     Temp Source 06/15/23 0025 Oral     SpO2 06/15/23 0025 98 %     Weight --      Height --      Head Circumference --      Peak Flow --      Pain Score 06/15/23 0024 9     Pain Loc --      Pain Education --      Exclude from Growth Chart --     Most recent vital signs: Vitals:   06/15/23 0025  BP: (!) 144/86  Pulse: 73  Resp: 16  Temp: 97.8 F (36.6 C)  SpO2: 98%    General: Awake, no distress.  CV:  Good peripheral perfusion.  Regular rate and rhythm  Resp:  Normal effort.  Equal breath sounds bilaterally.  Abd:  No distention.  Soft, moderate right upper quadrant tenderness.  Otherwise benign abdomen.  No rebound or guarding  ED Results / Procedures / Treatments   RADIOLOGY  Ultrasound concerning for acute  cholecystitis   MEDICATIONS ORDERED IN ED: Medications - No data to display   IMPRESSION / MDM / ASSESSMENT AND PLAN / ED COURSE  I reviewed the triage vital signs and the nursing notes.  Patient's presentation is most consistent with acute presentation with potential threat to life or bodily function.  Patient presents emergency department for significant right upper quadrant pain.  Moderate tenderness to palpation in this area.  Lab work is reassuring with a normal CBC normal chemistry including normal LFTs and lipase.  Urinalysis shows no concerning findings.  Patient is ultrasound shows cholelithiasis with a 4.4 mm gallbladder wall thickness concerning for acute cholecystitis.  I spoke with general surgery, they recommend admission for cholecystectomy.  I spoke to the patient regarding this she is agreeable to this plan as well.  Will place an IV IV hydrate while awaiting surgery consultation in the morning.  They will place admission orders.  FINAL CLINICAL IMPRESSION(S) / ED DIAGNOSES   Right upper quadrant pain Cholecystitis  Note:  This document was prepared using Dragon voice recognition software and may include unintentional dictation errors.   Minna Antis,  MD 06/15/23 0202

## 2023-06-15 NOTE — Anesthesia Preprocedure Evaluation (Signed)
Anesthesia Evaluation  Patient identified by MRN, date of birth, ID band Patient awake    Reviewed: Allergy & Precautions, NPO status , Patient's Chart, lab work & pertinent test results  Airway Mallampati: II  TM Distance: >3 FB Neck ROM: full    Dental  (+) Chipped, Dental Advidsory Given, Poor Dentition, Loose, Missing   Pulmonary sleep apnea and Continuous Positive Airway Pressure Ventilation , Current Smoker and Patient abstained from smoking.   Pulmonary exam normal        Cardiovascular Normal cardiovascular exam+ dysrhythmias      Neuro/Psych  PSYCHIATRIC DISORDERS Anxiety Depression    negative neurological ROS     GI/Hepatic Neg liver ROS,GERD  Medicated,,  Endo/Other  negative endocrine ROS    Renal/GU      Musculoskeletal   Abdominal   Peds  Hematology negative hematology ROS (+)   Anesthesia Other Findings Past Medical History: No date: Allergy No date: Anxiety No date: Arrhythmia No date: Arthritis No date: Asthma No date: Chronic bronchitis (HCC) No date: Chronic kidney disease No date: Depression No date: Fatty liver No date: Gallstones No date: Genital warts No date: GERD (gastroesophageal reflux disease) No date: History of recurrent UTIs No date: Hyperglycemia No date: IBS (irritable bowel syndrome) No date: Migraine No date: PTSD (post-traumatic stress disorder) No date: Sleep apnea No date: Substance abuse (HCC) No date: Urine incontinence  Past Surgical History: No date: TUBAL LIGATION     Reproductive/Obstetrics negative OB ROS                             Anesthesia Physical Anesthesia Plan  ASA: 3  Anesthesia Plan: General ETT   Post-op Pain Management:    Induction: Intravenous  PONV Risk Score and Plan: 3 and Ondansetron and Dexamethasone  Airway Management Planned: Oral ETT  Additional Equipment:   Intra-op Plan:   Post-operative  Plan: Extubation in OR  Informed Consent: I have reviewed the patients History and Physical, chart, labs and discussed the procedure including the risks, benefits and alternatives for the proposed anesthesia with the patient or authorized representative who has indicated his/her understanding and acceptance.     Dental Advisory Given  Plan Discussed with: Anesthesiologist, CRNA and Surgeon  Anesthesia Plan Comments: (Patient consented for risks of anesthesia including but not limited to:  - adverse reactions to medications - damage to eyes, teeth, lips or other oral mucosa - nerve damage due to positioning  - sore throat or hoarseness - Damage to heart, brain, nerves, lungs, other parts of body or loss of life  Patient voiced understanding and assent.)       Anesthesia Quick Evaluation

## 2023-06-15 NOTE — Transfer of Care (Signed)
Immediate Anesthesia Transfer of Care Note  Patient: Regina Nash  Procedure(s) Performed: XI ROBOTIC ASSISTED LAPAROSCOPIC CHOLECYSTECTOMY (Abdomen) INDOCYANINE GREEN FLUORESCENCE IMAGING (ICG) (Abdomen)  Patient Location: PACU  Anesthesia Type:General  Level of Consciousness: awake, alert , and oriented  Airway & Oxygen Therapy: Patient Spontanous Breathing  Post-op Assessment: Report given to RN and Post -op Vital signs reviewed and stable  Post vital signs: Reviewed and stable  Last Vitals:  Vitals Value Taken Time  BP 137/76 06/15/23 1050  Temp    Pulse 78 06/15/23 1052  Resp 13 06/15/23 1052  SpO2 97 % 06/15/23 1052  Vitals shown include unfiled device data.  Last Pain:  Vitals:   06/15/23 0413  TempSrc: Oral  PainSc:          Complications: No notable events documented.

## 2023-06-15 NOTE — ED Notes (Signed)
Surgery team at bedside to take pt to preop.

## 2023-06-15 NOTE — ED Triage Notes (Signed)
Pt with severe diffuse abdominal pain since tonight.  Hx of gallbladder issues and IBS.  Pt states this feels like her gallbladder but has lasted longer than normal attacks for her.

## 2023-06-16 MED ORDER — OXYCODONE HCL 5 MG PO TABS: 5.0000 mg | ORAL_TABLET | Freq: Four times a day (QID) | ORAL | 0 refills | Status: DC | PRN

## 2023-06-16 MED ORDER — IBUPROFEN 800 MG PO TABS: 800.0000 mg | ORAL_TABLET | Freq: Three times a day (TID) | ORAL | 0 refills | Status: AC | PRN

## 2023-06-16 MED ORDER — PANTOPRAZOLE SODIUM 40 MG PO TBEC
40.0000 mg | DELAYED_RELEASE_TABLET | Freq: Every day | ORAL | Status: DC
  Administered 2023-06-16: 40 mg via ORAL
  Filled 2023-06-16: qty 1

## 2023-06-16 NOTE — Progress Notes (Signed)
Discharge instructions reviewed with patient.  Follow up care reviewed with patient.  Questions answered and printed copies given to patient for reference after discharge home.  Patient's daughter meeting patient at the medical mall entrance for discharge.

## 2023-06-16 NOTE — Discharge Summary (Signed)
Patient ID: Regina Nash MRN: [id number] DOB/AGE: 1950/12/28 72 y.o.  Admit date: 06/15/2023 Discharge date: 06/16/2023   Discharge Diagnoses:  Principal Problem:   Cholecystitis   Procedures:Robotic assisted cholecystectomy  Hospital Course:   admitted with findings consistent with acute cholecystitis and  was taken promptly to the operating room for an uneventful robotic cholecystectomy.  Patient was kept overnight.  The time of discharge the patient was ambulating,  pain was controlled.  Her vital signs were stable and she was afebrile.   physical exam at discharge showed a pt  in no acute distress.  Awake and alert.  Abdomen: Soft incisions healing well without infection or peritonitis.  Extremities well-perfused and no edema.  Condition of the patient the time of discharge was stable   Consults: None  Disposition: Discharge disposition: 01-Home or Self Care       Discharge Instructions     Call MD for:  difficulty breathing, headache or visual disturbances   Complete by: As directed    Call MD for:  extreme fatigue   Complete by: As directed    Call MD for:  hives   Complete by: As directed    Call MD for:  persistant dizziness or light-headedness   Complete by: As directed    Call MD for:  persistant nausea and vomiting   Complete by: As directed    Call MD for:  redness, tenderness, or signs of infection (pain, swelling, redness, odor or green/yellow discharge around incision site)   Complete by: As directed    Call MD for:  severe uncontrolled pain   Complete by: As directed    Call MD for:  temperature >100.4   Complete by: As directed    Diet - low sodium heart healthy   Complete by: As directed    Discharge instructions   Complete by: As directed    Surgical glue over your incisions.  This will peel off over the next 2 to 4 weeks.  You may shower starting today.  Please let the warm soapy water run over your incisions and pat dry.  No submerging the wounds  in water for 2 weeks.  Please avoid heavy lifting greater than 10 to 15 pounds for 4 weeks.  You should use Tylenol and ibuprofen as needed for pain.  Please follow-up with our office in 2 weeks.   Increase activity slowly   Complete by: As directed         Follow-up Information     Kandis Cocking, MD Follow up in 2 week(s).   Specialty: General Surgery Contact information: [address] [place] [address] [phone]                  Baker Pierini, M.D.

## 2023-06-16 NOTE — Discharge Instructions (Signed)
Call to schedule a follow up appointment in 2 weeks with Dr. Maurine Minister.  If you have questions or concerns you should call the office or the on call provider.  If you have urgent concerns you should go to the nearest emergency for evaluation.

## 2023-06-16 NOTE — Progress Notes (Signed)
   06/16/23 0900  Spiritual Encounters  Type of Visit Initial  Care provided to: Patient  Referral source Patient request  Reason for visit Advance directives  OnCall Visit Yes  Spiritual Framework  Patient Stress Factors Family relationships  Interventions  Spiritual Care Interventions Made Reflective listening;Prayer   Chaplain responded to [facility] consult to provide information on Advance Directives.

## 2023-06-18 LAB — SURGICAL PATHOLOGY

## 2023-06-26 ENCOUNTER — Encounter: Payer: Self-pay | Admitting: Family Medicine

## 2023-06-26 DIAGNOSIS — G4733 Obstructive sleep apnea (adult) (pediatric): Secondary | ICD-10-CM

## 2023-06-28 NOTE — Telephone Encounter (Signed)
Looks like an order was sent to [place] on 03-11-23. The order is in her Media file.

## 2023-06-30 ENCOUNTER — Encounter: Payer: Self-pay | Admitting: Family Medicine

## 2023-06-30 DIAGNOSIS — E559 Vitamin D deficiency, unspecified: Secondary | ICD-10-CM

## 2023-07-01 ENCOUNTER — Encounter: Payer: Self-pay | Admitting: Family Medicine

## 2023-07-02 ENCOUNTER — Ambulatory Visit (INDEPENDENT_AMBULATORY_CARE_PROVIDER_SITE_OTHER): Payer: Medicare Other | Admitting: General Surgery

## 2023-07-02 ENCOUNTER — Encounter: Payer: Self-pay | Admitting: General Surgery

## 2023-07-02 VITALS — BP 112/74 | HR 99 | Temp 98.2°F | Ht 64.5 in | Wt 163.0 lb

## 2023-07-02 DIAGNOSIS — Z09 Encounter for follow-up examination after completed treatment for conditions other than malignant neoplasm: Secondary | ICD-10-CM

## 2023-07-02 DIAGNOSIS — K812 Acute cholecystitis with chronic cholecystitis: Secondary | ICD-10-CM

## 2023-07-02 DIAGNOSIS — K8000 Calculus of gallbladder with acute cholecystitis without obstruction: Secondary | ICD-10-CM

## 2023-07-02 NOTE — Patient Instructions (Signed)

## 2023-07-02 NOTE — Progress Notes (Signed)
Outpatient Surgical Follow Up  07/02/2023  Regina Nash is an 72 y.o. female.   Chief Complaint  Patient presents with   Routine Post Op    Lap choley 06/15/2023    HPI: Patient is seen status post robotic assisted cholecystectomy for acute on chronic cholecystitis.  She reports that she is doing okay.  She says that she has a lot of pain in her back and her posterior hips.  She says that she had this pain prior to surgery as well.  She also has some intermittent pain in her right hemiabdomen associated with diarrhea.  This was worse when she has had foods high in fat.  Otherwise, she is tolerating a diet and has had no episodes of nausea or vomiting.  She is having bowel function and denies any fevers or chills.  She says that her incisions are healing well without any drainage.  Past Medical History:  Diagnosis Date   Allergy    Anxiety    Arrhythmia    Arthritis    Asthma    Chronic bronchitis (HCC)    Chronic kidney disease    Depression    Fatty liver    Gallstones    Genital warts    GERD (gastroesophageal reflux disease)    History of recurrent UTIs    Hyperglycemia    IBS (irritable bowel syndrome)    Migraine    PTSD (post-traumatic stress disorder)    Sleep apnea    Substance abuse (HCC)    Urine incontinence     Past Surgical History:  Procedure Laterality Date   TUBAL LIGATION      Family History  Problem Relation Age of Onset   Alcoholism Other        Parent   Arthritis Other        Parent   Cancer Other        Breast, ovarian/uterine   Breast cancer Paternal Aunt        6's   Alcohol abuse Mother    Anxiety disorder Mother    Alcohol abuse Father    Anxiety disorder Sister    Anxiety disorder Sister    Depression Sister    Rheum arthritis Maternal Grandfather    Colon cancer Neg Hx     Social History:  reports that she has been smoking cigarettes. She has never used smokeless tobacco. She reports that she does not drink alcohol and does  not use drugs.  Allergies:  Allergies  Allergen Reactions   Codeine Other (See Comments)   Erythromycin Other (See Comments)    abdominal pain   Penicillins     Medications reviewed.    ROS Full ROS performed and is otherwise negative other than what is stated in HPI   BP 112/74   Pulse 99   Temp 98.2 F (36.8 C) (Oral)   Ht 5' 4.5" (1.638 m)   Wt 163 lb (73.9 kg)   LMP 02/16/2010 (Exact Date)   SpO2 95%   BMI 27.55 kg/m   Physical Exam Abdomen soft, nontender nondistended.  The robotic port sites are healing well.  The glue is starting to peel off her umbilical incision and right most abdominal incision.  There is no surrounding erythema or drainage.  Pathology consistent with chronic, focally active cholecystitis.    No results found for this or any previous visit (from the past 48 hours). No results found.  Assessment/Plan:  The patient is status post robotic cholecystectomy 2 weeks ago.  She reports improving and tolerating a diet.  She did have some diarrhea with fatty foods.  I discussed with her that this should get better over time as her body adapts.  She can continue use ibuprofen as needed for her back and hip pain.  I discussed with her lifting restrictions for 2 more weeks.  She can follow-up with [place] as needed  Baker Pierini, M.D. [facility]

## 2023-07-04 ENCOUNTER — Encounter: Payer: Medicare Other | Admitting: General Surgery

## 2023-07-21 ENCOUNTER — Encounter: Payer: Self-pay | Admitting: Family Medicine

## 2023-07-21 ENCOUNTER — Encounter: Payer: Self-pay | Admitting: General Surgery

## 2023-07-22 NOTE — Telephone Encounter (Signed)
 This patient needs an appointment particularly if she is still feeling poorly.

## 2023-07-23 ENCOUNTER — Telehealth: Payer: Self-pay

## 2023-07-23 ENCOUNTER — Ambulatory Visit (INDEPENDENT_AMBULATORY_CARE_PROVIDER_SITE_OTHER): Payer: Medicare Other | Admitting: Internal Medicine

## 2023-07-23 ENCOUNTER — Encounter: Payer: Self-pay | Admitting: Internal Medicine

## 2023-07-23 VITALS — BP 122/74 | HR 80 | Ht 64.5 in | Wt 163.6 lb

## 2023-07-23 DIAGNOSIS — E538 Deficiency of other specified B group vitamins: Secondary | ICD-10-CM

## 2023-07-23 DIAGNOSIS — E559 Vitamin D deficiency, unspecified: Secondary | ICD-10-CM | POA: Diagnosis not present

## 2023-07-23 DIAGNOSIS — R5383 Other fatigue: Secondary | ICD-10-CM | POA: Diagnosis not present

## 2023-07-23 DIAGNOSIS — R5381 Other malaise: Secondary | ICD-10-CM

## 2023-07-23 DIAGNOSIS — R252 Cramp and spasm: Secondary | ICD-10-CM | POA: Diagnosis not present

## 2023-07-23 NOTE — Telephone Encounter (Signed)
 Pt was seen by Dr. Darrick Huntsman this evening and has accepted pt as a TOC from Dr. Birdie Sons. Can we get her scheduled for a TOC appt with Dr. Darrick Huntsman.

## 2023-07-23 NOTE — Progress Notes (Signed)
 Subjective:  Patient ID: Regina Nash, female    DOB: July 05, 1951  Age: 73 y.o. MRN: [id number]  CC: The primary encounter diagnosis was Other fatigue. Diagnoses of Vitamin D  deficiency, Muscle cramps, and Malaise and fatigue were also pertinent to this visit.   HPI Regina Nash presents for  Chief Complaint  Patient presents with   Fatigue   73 yr old female with recent history of cholecystectomy Jun 16 2023 presents with fatigue following an 8 hour episode of recurrent nausea and vomiting on Jan 2 after eating at a [employer]  Has been having profound fatugie   Outpatient Medications Prior to Visit  Medication Sig Dispense Refill   ibuprofen  (ADVIL ) 800 MG tablet Take 1 tablet (800 mg total) by mouth every 8 (eight) hours as needed. 30 tablet 0   omeprazole (PRILOSEC) 20 MG capsule Take 20 mg by mouth daily.     propranolol  (INDERAL ) 10 MG tablet Take 1 tablet (10 mg total) by mouth 3 (three) times daily as needed. 60 tablet 3   No facility-administered medications prior to visit.    Review of Systems;  Patient denies headache, fevers, malaise, unintentional weight loss, skin rash, eye pain, sinus congestion and sinus pain, sore throat, dysphagia,  hemoptysis , cough, dyspnea, wheezing, chest pain, palpitations, orthopnea, edema, abdominal pain, nausea, melena, diarrhea, constipation, flank pain, dysuria, hematuria, urinary  Frequency, nocturia, numbness, tingling, seizures,  Focal weakness, Loss of consciousness,  Tremor, insomnia, depression, anxiety, and suicidal ideation.      Objective:  BP 122/74   Pulse 80   Ht 5' 4.5 (1.638 m)   Wt 163 lb 9.6 oz (74.2 kg)   LMP 02/16/2010 (Exact Date)   SpO2 98%   BMI 27.65 kg/m   BP Readings from Last 3 Encounters:  07/23/23 122/74  07/02/23 112/74  06/16/23 106/60    Wt Readings from Last 3 Encounters:  07/23/23 163 lb 9.6 oz (74.2 kg)  07/02/23 163 lb (73.9 kg)  06/15/23 165 lb (74.8 kg)    Physical Exam Vitals  reviewed.  Constitutional:      General: She is not in acute distress.    Appearance: Normal appearance. She is normal weight. She is not ill-appearing, toxic-appearing or diaphoretic.  HENT:     Head: Normocephalic.  Eyes:     General: No scleral icterus.       Right eye: No discharge.        Left eye: No discharge.     Conjunctiva/sclera: Conjunctivae normal.  Cardiovascular:     Rate and Rhythm: Normal rate and regular rhythm.     Heart sounds: Normal heart sounds.  Pulmonary:     Effort: Pulmonary effort is normal. No respiratory distress.     Breath sounds: Normal breath sounds.  Musculoskeletal:        General: Normal range of motion.  Skin:    General: Skin is warm and dry.     Coloration: Skin is pale.     Comments: Tenting noted   Neurological:     General: No focal deficit present.     Mental Status: She is alert and oriented to person, place, and time. Mental status is at baseline.  Psychiatric:        Mood and Affect: Mood normal.        Behavior: Behavior normal.        Thought Content: Thought content normal.        Judgment: Judgment normal.    Lab  Results  Component Value Date   HGBA1C 6.0 04/12/2021   HGBA1C 5.8 03/18/2018   HGBA1C 5.8 12/12/2017    Lab Results  Component Value Date   CREATININE 0.94 06/15/2023   CREATININE 0.83 01/04/2023   CREATININE 0.93 12/08/2022    Lab Results  Component Value Date   WBC 7.9 06/15/2023   HGB 14.0 06/15/2023   HCT 42.8 06/15/2023   PLT 230 06/15/2023   GLUCOSE 135 (H) 06/15/2023   CHOL 219 (H) 01/04/2023   TRIG 193 (H) 01/04/2023   HDL 39 (L) 01/04/2023   LDLDIRECT 147 (H) 01/04/2023   LDLCALC 141 (H) 01/04/2023   ALT 20 06/15/2023   AST 23 06/15/2023   NA 136 06/15/2023   K 3.7 06/15/2023   CL 101 06/15/2023   CREATININE 0.94 06/15/2023   BUN 10 06/15/2023   CO2 26 06/15/2023   TSH 1.851 01/04/2023   HGBA1C 6.0 04/12/2021    US  ABDOMEN LIMITED RUQ (LIVER/GB) Result Date:  06/15/2023 CLINICAL DATA:  Right upper quadrant pain. EXAM: ULTRASOUND ABDOMEN LIMITED RIGHT UPPER QUADRANT COMPARISON:  None Available. FINDINGS: Gallbladder: Multiple shadowing echogenic gallstones are seen within the gallbladder lumen. The largest measures approximately 9.4 mm. The gallbladder wall measures 4.4 mm in thickness. A positive sonographic Beverley sign is noted by sonographer. Common bile duct: Diameter: 6.1 mm Liver: No focal lesion identified. Diffusely increased echogenicity of the liver parenchyma is noted. Portal vein is patent on color Doppler imaging with normal direction of blood flow towards the liver. Other: Limited study secondary to overlying bowel gas. IMPRESSION: Cholelithiasis, in the setting of a positive sonographic Murphy's sign, consistent with acute cholecystitis. Electronically Signed   By: Suzen Dials M.D.   On: 06/15/2023 01:22    Assessment & Plan:  .Other fatigue -     CBC with Differential/Platelet; Future -     Comprehensive metabolic panel; Future -     B12 and Folate Panel; Future -     IBC + Ferritin; Future  Vitamin D  deficiency -     VITAMIN D  25 Hydroxy (Vit-D Deficiency, Fractures); Future  Muscle cramps -     Magnesium; Future  Malaise and fatigue Assessment & Plan: Following an epiosde of gastroenteritis accompanied by altered sense of smell and taste.  Suspect COVID infection  and dehdyration following cholecystectomy  . Increase hydration,  rest and return for labs      I provided 30 minutes of face-to-face time during this encounter reviewing patient's last visit with me, patient's  most recent visit with general surgery .   nephrology,  and neurology,  recent surgical and non surgical procedures, previous  labs and imaging studies, counseling on currently addressed issues,  and post visit ordering to diagnostics and therapeutics .   Follow-up: No follow-ups on file.   Verneita LITTIE Kettering, MD

## 2023-07-23 NOTE — Assessment & Plan Note (Signed)
 Following an epiosde of gastroenteritis accompanied by altered sense of smell and taste.  Suspect COVID infection  and dehdyration following cholecystectomy  . Increase hydration,  rest and return for labs

## 2023-07-23 NOTE — Patient Instructions (Signed)
 Gatorade ok   to use to hydrate    Simplify diet,  no fats .    Try benefiber to regulate stools   You probably had COVID complicating your recover from surgery

## 2023-07-24 ENCOUNTER — Telehealth: Payer: Self-pay

## 2023-07-24 ENCOUNTER — Encounter: Payer: Self-pay | Admitting: Internal Medicine

## 2023-07-24 ENCOUNTER — Other Ambulatory Visit: Payer: Medicare Other

## 2023-07-24 NOTE — Telephone Encounter (Signed)
 Patient is scheduled for lab on 07/25/23.

## 2023-07-24 NOTE — Telephone Encounter (Signed)
 Copied from CRM [id number]. Topic: Appointments - Appointment Cancel/Reschedule >> Jul 24, 2023  9:22 AM Rosina BIRCH wrote: Patient/patient representative is calling to cancel or reschedule an appointment. Refer to attachments for appointment information. Pt called stating her car broke down and she had to reschedule her lab appointment for today

## 2023-07-25 ENCOUNTER — Other Ambulatory Visit (INDEPENDENT_AMBULATORY_CARE_PROVIDER_SITE_OTHER): Payer: Medicare Other

## 2023-07-25 DIAGNOSIS — R5383 Other fatigue: Secondary | ICD-10-CM | POA: Diagnosis not present

## 2023-07-25 DIAGNOSIS — E559 Vitamin D deficiency, unspecified: Secondary | ICD-10-CM | POA: Diagnosis not present

## 2023-07-25 DIAGNOSIS — R252 Cramp and spasm: Secondary | ICD-10-CM | POA: Diagnosis not present

## 2023-07-26 ENCOUNTER — Encounter: Payer: Self-pay | Admitting: Internal Medicine

## 2023-07-26 DIAGNOSIS — E538 Deficiency of other specified B group vitamins: Secondary | ICD-10-CM | POA: Insufficient documentation

## 2023-07-26 LAB — CBC WITH DIFFERENTIAL/PLATELET
Basophils Absolute: 0.1 10*3/uL (ref 0.0–0.1)
Basophils Relative: 1.2 % (ref 0.0–3.0)
Eosinophils Absolute: 0.2 10*3/uL (ref 0.0–0.7)
Eosinophils Relative: 3.2 % (ref 0.0–5.0)
HCT: 41.8 % (ref 36.0–46.0)
Hemoglobin: 13.7 g/dL (ref 12.0–15.0)
Lymphocytes Relative: 39 % (ref 12.0–46.0)
Lymphs Abs: 2.7 10*3/uL (ref 0.7–4.0)
MCHC: 32.8 g/dL (ref 30.0–36.0)
MCV: 93 fL (ref 78.0–100.0)
Monocytes Absolute: 0.6 10*3/uL (ref 0.1–1.0)
Monocytes Relative: 8.3 % (ref 3.0–12.0)
Neutro Abs: 3.3 10*3/uL (ref 1.4–7.7)
Neutrophils Relative %: 48.3 % (ref 43.0–77.0)
Platelets: 237 10*3/uL (ref 150.0–400.0)
RBC: 4.5 Mil/uL (ref 3.87–5.11)
RDW: 12.6 % (ref 11.5–15.5)
WBC: 6.8 10*3/uL (ref 4.0–10.5)

## 2023-07-26 LAB — COMPREHENSIVE METABOLIC PANEL
ALT: 18 U/L (ref 0–35)
AST: 17 U/L (ref 0–37)
Albumin: 4 g/dL (ref 3.5–5.2)
Alkaline Phosphatase: 78 U/L (ref 39–117)
BUN: 8 mg/dL (ref 6–23)
CO2: 31 meq/L (ref 19–32)
Calcium: 9.2 mg/dL (ref 8.4–10.5)
Chloride: 103 meq/L (ref 96–112)
Creatinine, Ser: 0.8 mg/dL (ref 0.40–1.20)
GFR: 74.62 mL/min (ref >60.00)
Glucose, Bld: 82 mg/dL (ref 70–99)
Potassium: 4 meq/L (ref 3.5–5.1)
Sodium: 141 meq/L (ref 135–145)
Total Bilirubin: 0.4 mg/dL (ref 0.2–1.2)
Total Protein: 6.8 g/dL (ref 6.0–8.3)

## 2023-07-26 LAB — B12 AND FOLATE PANEL
Folate: >25.2 ng/mL (ref >5.9)
Vitamin B-12: 293 pg/mL (ref 211–911)

## 2023-07-26 LAB — IBC + FERRITIN
Ferritin: 133.7 ng/mL (ref 10.0–291.0)
Iron: 105 ug/dL (ref 42–145)
Saturation Ratios: 37.3 % (ref 20.0–50.0)
TIBC: 281.4 ug/dL (ref 250.0–450.0)
Transferrin: 201 mg/dL — ABNORMAL LOW (ref 212.0–360.0)

## 2023-07-26 LAB — MAGNESIUM: Magnesium: 2 mg/dL (ref 1.5–2.5)

## 2023-07-26 LAB — VITAMIN D 25 HYDROXY (VIT D DEFICIENCY, FRACTURES): VITD: 11.57 ng/mL — ABNORMAL LOW (ref 30.00–100.00)

## 2023-07-26 MED ORDER — ERGOCALCIFEROL 1.25 MG (50000 UT) PO CAPS: 50000.0000 [IU] | ORAL_CAPSULE | ORAL | 0 refills | Status: DC

## 2023-07-26 NOTE — Assessment & Plan Note (Signed)
 RECURRENT,  SEVERE. WEEKLY DOSES OF 50 000 IUS RECOMMENDED X 3 MONTHS

## 2023-07-26 NOTE — Addendum Note (Signed)
 Addended by: Sherlene Shams on: 07/26/2023 05:53 PM   Modules accepted: Orders

## 2023-07-29 ENCOUNTER — Encounter: Payer: Self-pay | Admitting: Internal Medicine

## 2023-07-30 NOTE — Telephone Encounter (Signed)
 Pt has an appointment 08/19/23 with pulmonology.  Will work with pulmonologist to get CPAP locally.

## 2023-07-31 ENCOUNTER — Ambulatory Visit: Payer: Medicare Other

## 2023-08-15 ENCOUNTER — Ambulatory Visit: Payer: Self-pay | Admitting: Family Medicine

## 2023-08-15 NOTE — Telephone Encounter (Signed)
Copied from CRM [id number]. Topic: Clinical - Red Word Triage >> Aug 15, 2023  3:48 PM Adele Barthel wrote: Red Word that prompted transfer to Nurse Triage: Patient having symptoms for at least 5 days. Has had fever, highest 100. 8. Temperature at 3:15 PM was 99. Severe cough, congestion/fluid, headache from coughing. Daughter was diagnosed with a viral illness last week. Daughter is now taking an antibiotic.   Chief Complaint:  Patient reports  Respiratory symptoms for last 5 days.   Also, fever x 2 days  with highest 100. 8. Temperature at 3:15 PM was 99.8  Patient  has a  ratty cough  with no wheezing or stridor noted. Patient reports  her sputum is white but very thick.  Also reports  Headache from coughing and congestion. Daughter was diagnosed with a viral illness last week. Daughter is now taking an antibiotic.  Symptoms:  Fever  x 2 days  Cough,Chest Congestion and nasal congestion x 5 days/  Pertinent Negatives: Patient denies difficulty breathing. Disposition: [] ED /[] Urgent Care (no appt availability in office) / [x] Appointment(In office/virtual)/ []  [place] [facility]/ [] Home Care/ [] Refused Recommended Disposition /[] [place] [facility]/ []  Follow-up with PCP Additional Notes: Scheduled n the office for 08/16/2023. Advised to call back if she begins to feel worst before her appointment Patient verbalized understanding. Reason for Disposition  SEVERE coughing spells (e.g., whooping sound after coughing, vomiting after coughing)  Answer Assessment - Initial Assessment Questions 1. TEMPERATURE: "What is the most recent temperature?"  "How was it measured?"       Fever started on yesterday  High was 100.8 and currently  99.8 2. ONSET: "When did the fever start?"    day 2  3. CHILLS: "Do you have chills?" If yes: "How bad are they?"  (e.g., none, mild, moderate, severe)   - NONE: no chills   - MILD: feeling cold   4. OTHER SYMPTOMS: "Do you have any other symptoms besides the fever?"   (e.g., abdomen pain, cough, diarrhea, earache, headache, sore throat, urination pain)      Cough  and Congestion in her head neck and chest 5. CAUSE: If there are no symptoms, ask: "What do you think is causing the fever?"          Sputum is clear  but very thick 6. CONTACTS: "Does anyone else in the family have an infection?"      Daughter has been sick with a viral 7. TREATMENT: "What have you done so far to treat this fever?" (e.g., medications)      Nothing 8. IMMUNOCOMPROMISE: "Do you have of the following: diabetes, HIV positive, splenectomy, cancer chemotherapy, chronic steroid treatment, transplant patient, etc."      Denies 9. PREGNANCY: "Is there any chance you are pregnant?" "When was your last menstrual period?"    NA 10. TRAVEL: "Have you traveled out of the country in the last month?" (e.g., travel history, exposures)        Denies.  Protocols used: Fever-A-AH, Cough - Acute Productive-A-AH

## 2023-08-15 NOTE — Telephone Encounter (Signed)
Scheduled to see you tomorrow.

## 2023-08-16 ENCOUNTER — Ambulatory Visit (INDEPENDENT_AMBULATORY_CARE_PROVIDER_SITE_OTHER): Payer: Medicare Other | Admitting: Nurse Practitioner

## 2023-08-16 ENCOUNTER — Ambulatory Visit: Payer: Medicare Other

## 2023-08-16 ENCOUNTER — Encounter: Payer: Self-pay | Admitting: Nurse Practitioner

## 2023-08-16 VITALS — BP 122/72 | HR 91 | Temp 97.6°F | Ht 64.5 in | Wt 162.0 lb

## 2023-08-16 DIAGNOSIS — R509 Fever, unspecified: Secondary | ICD-10-CM | POA: Diagnosis not present

## 2023-08-16 DIAGNOSIS — J069 Acute upper respiratory infection, unspecified: Secondary | ICD-10-CM

## 2023-08-16 DIAGNOSIS — R5383 Other fatigue: Secondary | ICD-10-CM | POA: Diagnosis not present

## 2023-08-16 DIAGNOSIS — E538 Deficiency of other specified B group vitamins: Secondary | ICD-10-CM | POA: Diagnosis not present

## 2023-08-16 MED ORDER — DOXYCYCLINE HYCLATE 100 MG PO TABS: 100.0000 mg | ORAL_TABLET | Freq: Two times a day (BID) | ORAL | 0 refills | Status: AC

## 2023-08-16 NOTE — Telephone Encounter (Signed)
Pt had labs drawn, was explained about additional testing being needed in regards to vit b12 results
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
## 2023-08-19 ENCOUNTER — Institutional Professional Consult (permissible substitution): Payer: Medicare Other | Admitting: Internal Medicine

## 2023-08-19 DIAGNOSIS — J069 Acute upper respiratory infection, unspecified: Secondary | ICD-10-CM | POA: Insufficient documentation

## 2023-08-19 LAB — INTRINSIC FACTOR ANTIBODIES: Intrinsic Factor: NEGATIVE

## 2023-08-19 LAB — METHYLMALONIC ACID, SERUM: Methylmalonic Acid, Quant: 145 nmol/L (ref 69–390)

## 2023-08-19 NOTE — Assessment & Plan Note (Signed)
Symptoms of cough, congestion, and fatigue for one week with intermittent low-grade fever. No improvement with conservative management. -Will treat with Doxcycline. -Encourage increased fluid intake to help with hydration and thinning of secretions. -Chest x-ray pending. -Reevaluate if symptoms persist or worsen.

## 2023-08-20 ENCOUNTER — Encounter: Payer: Medicare Other | Admitting: Internal Medicine

## 2023-08-21 ENCOUNTER — Encounter: Payer: Self-pay | Admitting: Internal Medicine

## 2023-08-29 ENCOUNTER — Encounter: Payer: Self-pay | Admitting: Internal Medicine

## 2023-08-29 ENCOUNTER — Ambulatory Visit: Payer: Medicare Other | Admitting: Internal Medicine

## 2023-08-29 VITALS — BP 116/86 | HR 84 | Temp 97.6°F | Ht 64.5 in | Wt 166.6 lb

## 2023-08-29 DIAGNOSIS — Z720 Tobacco use: Secondary | ICD-10-CM | POA: Diagnosis not present

## 2023-08-29 DIAGNOSIS — G4733 Obstructive sleep apnea (adult) (pediatric): Secondary | ICD-10-CM | POA: Diagnosis not present

## 2023-08-29 NOTE — Patient Instructions (Addendum)
Referral to DME company [id number]  Start AUTO CPAP 4-8 cm h20  DME for new machine  Avoid Allergens and Irritants Avoid secondhand smoke Avoid SICK contacts Recommend  Masking  when appropriate Recommend Keep up-to-date with vaccinations  Recommend lung cancer screening referral program   Be aware of reduced alertness and do not drive or operate heavy machinery if experiencing this or drowsiness.  Exercise encouraged, as tolerated. Encouraged proper weight management.  Important to get eight or more hours of sleep  Limiting the use of the computer and television before bedtime.  Decrease naps during the day, so night time sleep will become enhanced.  Limit caffeine, and sleep deprivation.

## 2023-08-29 NOTE — Progress Notes (Signed)
Name: Regina Nash MRN: [id number] DOB: [birth year]    CHIEF COMPLAINT:  EXCESSIVE DAYTIME SLEEPINESS   HISTORY OF PRESENT ILLNESS: Patient is seen today for problems and issues with sleep related to excessive daytime sleepiness Patient  has been having sleep problems for many years Patient has been having excessive daytime sleepiness for a long time Patient has been having extreme fatigue and tiredness, lack of energy +  very Loud snoring every night + struggling breathe at night and gasps for air + morning headaches + Nonrefreshing sleep Patient diagnosed with sleep apnea 5 years ago Patient did not tolerate mask or pressures  I have discussed alternative therapy with inspire and oral device At this time patient is going to try  CPAP with different mask  Discussed sleep data and reviewed with patient.  Encouraged proper weight management.  Discussed driving precautions and its relationship with hypersomnolence.  Discussed operating dangerous equipment and its relationship with hypersomnolence.  Discussed sleep hygiene, and benefits of a fixed sleep waked time.  The importance of getting eight or more hours of sleep discussed with patient.  Discussed limiting the use of the computer and television before bedtime.  Decrease naps during the day, so night time sleep will become enhanced.  Limit caffeine, and sleep deprivation.  HTN, stroke, and heart failure are potential risk factors.   HST August 2024 shows moderate OSA AHI 17  No evidence of heart failure at this time No evidence or signs of infection at this time No respiratory distress No fevers, chills, nausea, vomiting, diarrhea No evidence of lower extremity edema No evidence hemoptysis  Patient is a smoker Smoking cessation strongly advised She is going through a lot of stress in her life right now  Smoking Assessment and Cessation Counseling Upon further questioning, Patient smokes 1 pp week I have advised  patient to quit/stop smoking as soon as possible due to high risk for multiple medical problems  Patient is willing to quit smoking   I have advised patient that we can assist and have options of Nicotine replacement therapy. I also advised patient on behavioral therapy and can provide oral medication therapy in conjunction with the other therapies Follow up next Office visit  for assessment of smoking cessation Smoking cessation counseling advised for >10 minutes   PAST MEDICAL HISTORY :   has a past medical history of Allergy, Anxiety, Arrhythmia, Arthritis, Asthma, Chronic bronchitis (HCC), Chronic kidney disease, Depression, Fatty liver, Gallstones, Genital warts, GERD (gastroesophageal reflux disease), History of recurrent UTIs, Hyperglycemia, IBS (irritable bowel syndrome), Migraine, PTSD (post-traumatic stress disorder), Sleep apnea, Substance abuse (HCC), and Urine incontinence.  has a past surgical history that includes Tubal ligation. Prior to Admission medications   Medication Sig Start Date End Date Taking? Authorizing Provider  ergocalciferol (DRISDOL) 1.25 MG (50000 UT) capsule Take 1 capsule (50,000 Units total) by mouth once a week. 07/26/23  Yes Sherlene Shams, MD  ibuprofen (ADVIL) 800 MG tablet Take 1 tablet (800 mg total) by mouth every 8 (eight) hours as needed. 06/16/23  Yes Kandis Cocking, MD  omeprazole (PRILOSEC) 20 MG capsule Take 20 mg by mouth daily.   Yes [provider]  propranolol (INDERAL) 10 MG tablet Take 1 tablet (10 mg total) by mouth 3 (three) times daily as needed. Patient not taking: Reported on 08/29/2023 01/04/23   Antonieta Iba, MD   Allergies  Allergen Reactions   Codeine Other (See Comments)   Erythromycin Other (See Comments)  abdominal pain   Penicillins     FAMILY HISTORY:  family history includes Alcohol abuse in her father and mother; Alcoholism in an other family member; Anxiety disorder in her mother, sister, and sister;  Arthritis in an other family member; Breast cancer in her paternal aunt; Cancer in an other family member; Depression in her sister; Rheum arthritis in her maternal grandfather. SOCIAL HISTORY:  reports that she has been smoking cigarettes. She has never used smokeless tobacco. She reports that she does not drink alcohol and does not use drugs.   Review of Systems:  Gen:  Denies  fever, sweats, chills weight loss  HEENT: Denies blurred vision, double vision, ear pain, eye pain, hearing loss, nose bleeds, sore throat Cardiac:  No dizziness, chest pain or heaviness, chest tightness,edema, No JVD Resp:   No cough, -sputum production, -shortness of breath,-wheezing, -hemoptysis,  Gi: Denies swallowing difficulty, stomach pain, nausea or vomiting, diarrhea, constipation, bowel incontinence Gu:  Denies bladder incontinence, burning urine Ext:   Denies Joint pain, stiffness or swelling Skin: Denies  skin rash, easy bruising or bleeding or hives Endoc:  Denies polyuria, polydipsia , polyphagia or weight change Psych:   Denies depression, insomnia or hallucinations  Other:  All other systems negative   ALL OTHER ROS ARE NEGATIVE  BP 116/86 (BP Location: Left Arm, Patient Position: Sitting, Cuff Size: Normal)   Pulse 84   Temp 97.6 F (36.4 C) (Temporal)   Ht 5' 4.5" (1.638 m)   Wt 166 lb 9.6 oz (75.6 kg)   LMP 02/16/2010 (Exact Date)   SpO2 98%   BMI 28.16 kg/m     Physical Examination:   General Appearance: No distress  EYES PERRLA, EOM intact.   NECK Supple, No JVD ORAL CAVITY MALLAMPATI 4 Pulmonary: normal breath sounds, No wheezing.  CardiovascularNormal S1,S2.  No m/r/g.   Abdomen: Benign, Soft, non-tender. Skin:   warm, no rashes, no ecchymosis  Extremities: normal, no cyanosis, clubbing. Neuro:without focal findings,  speech normal  PSYCHIATRIC: Mood, affect within normal limits.   ALL OTHER ROS ARE NEGATIVE    ASSESSMENT AND PLAN SYNOPSIS  73 year old pleasant  patient with signs and symptoms of excessive daytime sleepiness with  underlying diagnosis of moderate obstructive sleep apnea with AHI 17 in the setting  deconditioned state  Assessment of OSA Moderate AHI 17 Referral to DME company [employer] MASK Start AUTO CPAP 4-8 cm h20 DME for new machine Be aware of reduced alertness and do not drive or operate heavy machinery if experiencing this or drowsiness.  Exercise encouraged, as tolerated. Encouraged proper weight management.  Important to get eight or more hours of sleep  Limiting the use of the computer and television before bedtime.  Decrease naps during the day, so night time sleep will become enhanced.  Limit caffeine, and sleep deprivation.    Overweight -recommend significant weight loss -recommend changing diet  Deconditioned state -Recommend increased daily activity and exercise  Tobacco abuse Smoking cessation counseling Recommend lung cancer screening program   MEDICATION ADJUSTMENTS/LABS AND TESTS ORDERED: Referral to DME company [employer] MASK Start AUTO CPAP 4-8 cm h20 DME for new machine Avoid Allergens and Irritants Avoid secondhand smoke Avoid SICK contacts Recommend  Masking  when appropriate Recommend Keep up-to-date with vaccinations Recommend lung cancer screening referral program  CURRENT MEDICATIONS REVIEWED AT LENGTH WITH PATIENT TODAY   Patient  satisfied with Plan of action and management. All questions answered  Follow up  3  months   I spent a total of  62 minutes reviewing chart data, face-to-face evaluation with the patient, counseling and coordination of care as detailed above.    Lucie Leather, M.D.  [facility]  Medical Director [facility] Medical Director [facility] Cardio-Pulmonary Department

## 2023-09-02 ENCOUNTER — Encounter: Payer: Self-pay | Admitting: Nurse Practitioner

## 2023-09-03 ENCOUNTER — Encounter: Payer: Medicare Other | Admitting: Nurse Practitioner

## 2023-10-02 ENCOUNTER — Telehealth: Payer: Self-pay

## 2023-10-02 DIAGNOSIS — E559 Vitamin D deficiency, unspecified: Secondary | ICD-10-CM

## 2023-10-02 NOTE — Telephone Encounter (Signed)
 Copied from CRM [id number]. Topic: Clinical - Request for Lab/Test Order >> Oct 02, 2023  4:08 PM Dimitri Ped wrote: Reason for CRM: Dr Darrick Huntsman requested patient get follow up labs on vitamin d . Dr Darrick Huntsman said to do labs after  12 weeks of using the vitamin d and patient says she has 2 weeks left of the vitamin d . Was prescribed on 07/26/2023. So please put labs in around that time she will be finished

## 2023-10-03 ENCOUNTER — Telehealth: Payer: Self-pay

## 2023-10-03 MED ORDER — ERGOCALCIFEROL 1.25 MG (50000 UT) PO CAPS: 50000.0000 [IU] | ORAL_CAPSULE | ORAL | 0 refills | Status: DC

## 2023-10-03 NOTE — Addendum Note (Signed)
 Addended by: Sandy Salaam on: 10/03/2023 03:18 PM   Modules accepted: Orders

## 2023-10-03 NOTE — Telephone Encounter (Signed)
 Error

## 2023-10-03 NOTE — Telephone Encounter (Signed)
 Pt is aware and gave a verbal understanding.

## 2023-10-03 NOTE — Telephone Encounter (Signed)
 Copied from CRM [id number]. Topic: Appointments - Transfer of Care >> Oct 02, 2023  4:05 PM Whitney O wrote: Pt is requesting to transfer FROM: Dr Birdie Sons Pt is requesting to transfer TO: Dr Darrick Huntsman Reason for requested transfer: doctor is leaving  It is the responsibility of the team the patient would like to transfer to (Dr. Darrick Huntsman) to reach out to the patient if for any reason this transfer is not acceptable. Patient says Dr Darrick Huntsman will accept her as a new patient but I'm not able to schedule her because it shows Dr Darrick Huntsman is not accepting new patients . Please reach out to patient to get her scheduled [phone]  I spoke with patient and scheduled her for a TOC with Dr. Duncan Dull.  I also scheduled patient for a lab visit - orders are already in system.

## 2023-10-03 NOTE — Telephone Encounter (Signed)
 noted

## 2023-10-03 NOTE — Telephone Encounter (Signed)
 I don't see where you recommended her to have her vitamin d level checked again once completing the mega dose. Does she need to have it rechecked?

## 2023-10-14 DIAGNOSIS — R0602 Shortness of breath: Secondary | ICD-10-CM | POA: Diagnosis not present

## 2023-10-18 ENCOUNTER — Other Ambulatory Visit

## 2023-11-08 ENCOUNTER — Encounter: Payer: Self-pay | Admitting: Internal Medicine

## 2023-11-08 ENCOUNTER — Ambulatory Visit (INDEPENDENT_AMBULATORY_CARE_PROVIDER_SITE_OTHER): Admitting: Internal Medicine

## 2023-11-08 VITALS — BP 120/70 | HR 83 | Ht 64.5 in | Wt 169.8 lb

## 2023-11-08 DIAGNOSIS — Z780 Asymptomatic menopausal state: Secondary | ICD-10-CM | POA: Diagnosis not present

## 2023-11-08 DIAGNOSIS — E538 Deficiency of other specified B group vitamins: Secondary | ICD-10-CM

## 2023-11-08 DIAGNOSIS — N889 Noninflammatory disorder of cervix uteri, unspecified: Secondary | ICD-10-CM

## 2023-11-08 DIAGNOSIS — G4730 Sleep apnea, unspecified: Secondary | ICD-10-CM | POA: Diagnosis not present

## 2023-11-08 DIAGNOSIS — Z1211 Encounter for screening for malignant neoplasm of colon: Secondary | ICD-10-CM

## 2023-11-08 DIAGNOSIS — R195 Other fecal abnormalities: Secondary | ICD-10-CM

## 2023-11-08 DIAGNOSIS — N183 Chronic kidney disease, stage 3 unspecified: Secondary | ICD-10-CM

## 2023-11-08 DIAGNOSIS — E559 Vitamin D deficiency, unspecified: Secondary | ICD-10-CM

## 2023-11-08 DIAGNOSIS — S32010S Wedge compression fracture of first lumbar vertebra, sequela: Secondary | ICD-10-CM

## 2023-11-08 DIAGNOSIS — Z1231 Encounter for screening mammogram for malignant neoplasm of breast: Secondary | ICD-10-CM

## 2023-11-08 DIAGNOSIS — Z720 Tobacco use: Secondary | ICD-10-CM

## 2023-11-08 MED ORDER — ERGOCALCIFEROL 1.25 MG (50000 UT) PO CAPS: 50000.0000 [IU] | ORAL_CAPSULE | ORAL | 3 refills | Status: AC

## 2023-11-08 NOTE — Assessment & Plan Note (Addendum)
 Reduced form 1.5 packs to 2.5 cigs daily . It's a crutch" History of alcohol abuse, quit 4 years ago  ,  husband  I a veteran agent orange,  then had TBI in 2022  and now in  a nursing home bc totally disabled.  Still dealing with workmen's comp, guardianship  . Also caring for 71 yr old daughter who is unemployable due to mutiple psychiatric and physical issues but can't get disability

## 2023-11-08 NOTE — Assessment & Plan Note (Signed)
 Trouble tolerating nasal pillows  cannot keep it on.   Gets a headache from wearing it ,  then takes it off .  Felt smothered with the full face mask.

## 2023-11-08 NOTE — Progress Notes (Addendum)
 Subjective:  Patient ID: Regina Nash, female    DOB: 11-02-1950  Age: 73 y.o. MRN: [id number]  CC: The primary encounter diagnosis was Colon cancer screening. Diagnoses of Sleep apnea, unspecified type, Tobacco abuse, Breast cancer screening by mammogram, Postmenopausal estrogen deficiency, Vitamin D  deficiency, Compression fracture of L1 vertebra, sequela, Stage 3 chronic kidney disease, unspecified whether stage 3a or 3b CKD (HCC), B12 deficiency, Abnormal appearance of cervix, and Positive colorectal cancer screening using Cologuard test were also pertinent to this visit.   HPI Emanuelle Hammerstrom presents for  Chief Complaint  Patient presents with   Transfer of Care   OSA:  repeat study  was done last Fall and CPAP with nasal pillows was prescribed  However, .she has been unable to tolerate it.  Has an appt with Dr Auston Left in 3 weeks for follow  up  Tobacco abuse: has reduced her smoking but not quit completely due to family stressors   Arrhythmia:  uses propranolol  prn tachycardia   Caregiver fatigue:  husband is disabled from TBI and in a skilled nursing facility.  Daughter is unemployable due to medical/psychiatric issues.    HM:  she has deferred colonoscopy despite positive COLOGUARD test in 2019.  Last PAP smear was normal in 2020. Mammogram  normal 2023.  Defers chest CT for lung CA screening    Outpatient Medications Prior to Visit  Medication Sig Dispense Refill   omeprazole (PRILOSEC) 20 MG capsule Take 20 mg by mouth daily.     ergocalciferol  (DRISDOL ) 1.25 MG (50000 UT) capsule Take 1 capsule (50,000 Units total) by mouth once a week. 12 capsule 0   ibuprofen  (ADVIL ) 800 MG tablet Take 1 tablet (800 mg total) by mouth every 8 (eight) hours as needed. (Patient not taking: Reported on 11/21/2023) 30 tablet 0   propranolol  (INDERAL ) 10 MG tablet Take 1 tablet (10 mg total) by mouth 3 (three) times daily as needed. (Patient not taking: Reported on 11/21/2023) 60 tablet 3   No  facility-administered medications prior to visit.    Review of Systems;  Patient denies headache, fevers, malaise, unintentional weight loss, skin rash, eye pain, sinus congestion and sinus pain, sore throat, dysphagia,  hemoptysis , cough, dyspnea, wheezing, chest pain, palpitations, orthopnea, edema, abdominal pain, nausea, melena, diarrhea, constipation, flank pain, dysuria, hematuria, urinary  Frequency, nocturia, numbness, tingling, seizures,  Focal weakness, Loss of consciousness,  Tremor, insomnia, depression, anxiety, and suicidal ideation.      Objective:  BP 120/70   Pulse 83   Ht 5' 4.5" (1.638 m)   Wt 169 lb 12.8 oz (77 kg)   LMP 02/16/2010 (Exact Date)   SpO2 96%   BMI 28.70 kg/m   BP Readings from Last 3 Encounters:  11/21/23 110/70  11/08/23 120/70  08/29/23 116/86    Wt Readings from Last 3 Encounters:  11/21/23 170 lb 9.6 oz (77.4 kg)  11/08/23 169 lb 12.8 oz (77 kg)  08/29/23 166 lb 9.6 oz (75.6 kg)    Physical Exam Vitals reviewed.  Constitutional:      General: She is not in acute distress.    Appearance: Normal appearance. She is normal weight. She is not ill-appearing, toxic-appearing or diaphoretic.  HENT:     Head: Normocephalic.  Eyes:     General: No scleral icterus.       Right eye: No discharge.        Left eye: No discharge.     Conjunctiva/sclera: Conjunctivae normal.  Cardiovascular:  Rate and Rhythm: Normal rate and regular rhythm.     Heart sounds: Normal heart sounds.  Pulmonary:     Effort: Pulmonary effort is normal. No respiratory distress.     Breath sounds: Normal breath sounds.  Musculoskeletal:        General: Normal range of motion.  Skin:    General: Skin is warm and dry.  Neurological:     General: No focal deficit present.     Mental Status: She is alert and oriented to person, place, and time. Mental status is at baseline.  Psychiatric:        Mood and Affect: Mood normal.        Behavior: Behavior normal.         Thought Content: Thought content normal.        Judgment: Judgment normal.    Lab Results  Component Value Date   HGBA1C 6.0 04/12/2021   HGBA1C 5.8 03/18/2018   HGBA1C 5.8 12/12/2017    Lab Results  Component Value Date   CREATININE 0.80 07/25/2023   CREATININE 0.94 06/15/2023   CREATININE 0.83 01/04/2023    Lab Results  Component Value Date   WBC 6.8 07/25/2023   HGB 13.7 07/25/2023   HCT 41.8 07/25/2023   PLT 237.0 07/25/2023   GLUCOSE 82 07/25/2023   CHOL 219 (H) 01/04/2023   TRIG 193 (H) 01/04/2023   HDL 39 (L) 01/04/2023   LDLDIRECT 147 (H) 01/04/2023   LDLCALC 141 (H) 01/04/2023   ALT 18 07/25/2023   AST 17 07/25/2023   NA 141 07/25/2023   K 4.0 07/25/2023   CL 103 07/25/2023   CREATININE 0.80 07/25/2023   BUN 8 07/25/2023   CO2 31 07/25/2023   TSH 1.851 01/04/2023   HGBA1C 6.0 04/12/2021    US  ABDOMEN LIMITED RUQ (LIVER/GB) Result Date: 06/15/2023 CLINICAL DATA:  Right upper quadrant pain. EXAM: ULTRASOUND ABDOMEN LIMITED RIGHT UPPER QUADRANT COMPARISON:  None Available. FINDINGS: Gallbladder: Multiple shadowing echogenic gallstones are seen within the gallbladder lumen. The largest measures approximately 9.4 mm. The gallbladder wall measures 4.4 mm in thickness. A positive sonographic Abigail Abler sign is noted by sonographer. Common bile duct: Diameter: 6.1 mm Liver: No focal lesion identified. Diffusely increased echogenicity of the liver parenchyma is noted. Portal vein is patent on color Doppler imaging with normal direction of blood flow towards the liver. Other: Limited study secondary to overlying bowel gas. IMPRESSION: Cholelithiasis, in the setting of a positive sonographic Murphy's sign, consistent with acute cholecystitis. Electronically Signed   By: Virgle Grime M.D.   On: 06/15/2023 01:22    Assessment & Plan:  .Colon cancer screening -     Cologuard  Sleep apnea, unspecified type Assessment & Plan: Trouble tolerating nasal pillows   cannot keep it on.   Gets a headache from wearing it ,  then takes it off .  Felt smothered with the full face mask.    Tobacco abuse Assessment & Plan: Reduced form 1.5 packs to 2.5 cigs daily . It's a crutch" History of alcohol abuse, quit 4 years ago  ,  husband  I a veteran agent orange,  then had TBI in 2022  and now in  a nursing home bc totally disabled.  Still dealing with workmen's comp, guardianship  . Also caring for 70 yr old daughter who is unemployable due to mutiple psychiatric and physical issues but can't get disability     Breast cancer screening by mammogram  Postmenopausal estrogen deficiency -  DG Bone Density; Future  Vitamin D  deficiency -     Ergocalciferol ; Take 1 capsule (50,000 Units total) by mouth once a week.  Dispense: 12 capsule; Refill: 3  Compression fracture of L1 vertebra, sequela Assessment & Plan: Occurred during a fall.  DEXA was normal in 2020. Repeat DEXA has been ordered    Stage 3 chronic kidney disease, unspecified whether stage 3a or 3b CKD (HCC) Assessment & Plan: She will continue to avoid NSAIDs. GFR is stable  Lab Results  Component Value Date   CREATININE 0.80 07/25/2023      B12 deficiency Assessment & Plan: She has not been supplementing due to misinformation. Clarified the effect  of PPI on B12 and advised that she resume oral supplements    Abnormal appearance of cervix Assessment & Plan: PAP smear was normal   I 2020 but no endocervical zone was sampled.  However she reported pinkish discharge and  was referred to [facility]  for PMB.  Notes reviewed;  She deferred pelvic exam by Dr Luster Salters.  Transvaginal US  was  done Sept 2020 and reported as upper limits of normal.  She did not return to Dr Luster Salters.    Positive colorectal cancer screening using Cologuard test -     Ambulatory referral to Gastroenterology     I spent 44 minutes on the day of this face to face encounter reviewing patient's  most recent visit with  cardiology,  nephrology,   prior relevant surgical and non surgical procedures, recent  labs and imaging studies, counseling on weight management,  reviewing the assessment and plan with patient, and post visit ordering and reviewing of  diagnostics and therapeutics with patient  .   Follow-up: Return in about 4 weeks (around 12/06/2023) for physical.   Thersia Flax, MD

## 2023-11-08 NOTE — Patient Instructions (Addendum)
 Your annual mammogram AND  DEXA  SCAN have been ordered.  Please call [place] to call to make your appointments  .  The phone number for Tony Frederickson is  [phone]     Hair skin and nails multivitamin s are worth trying  but watch out for excess D since you are taking a separate D vitamin  B12 does NOT interfere with omeprazole.  IT'S VICE VERSA.  Please start a b12 supplement 1000 mcg daily

## 2023-11-09 NOTE — Assessment & Plan Note (Signed)
 She will continue to avoid NSAIDs. GFR is stable  Lab Results  Component Value Date   CREATININE 0.80 07/25/2023

## 2023-11-09 NOTE — Assessment & Plan Note (Addendum)
 Occurred during a fall.  DEXA was normal in 2020. Repeat DEXA has been ordered

## 2023-11-09 NOTE — Assessment & Plan Note (Addendum)
 PAP smear was normal   I 2020 but no endocervical zone was sampled.  However she reported pinkish discharge and  was referred to [facility]  for PMB.  Notes reviewed;  She deferred pelvic exam by Dr Luster Salters.  Transvaginal US  was  done Sept 2020 and reported as upper limits of normal.  She did not return to Dr Luster Salters.

## 2023-11-09 NOTE — Assessment & Plan Note (Signed)
 She has not been supplementing due to misinformation. Clarified the effect  of PPI on B12 and advised that she resume oral supplements

## 2023-11-11 ENCOUNTER — Other Ambulatory Visit: Payer: Self-pay | Admitting: Internal Medicine

## 2023-11-11 DIAGNOSIS — Z1231 Encounter for screening mammogram for malignant neoplasm of breast: Secondary | ICD-10-CM

## 2023-11-21 ENCOUNTER — Ambulatory Visit: Admitting: Internal Medicine

## 2023-11-21 ENCOUNTER — Encounter: Payer: Self-pay | Admitting: Internal Medicine

## 2023-11-21 VITALS — BP 110/70 | HR 75 | Temp 98.1°F | Ht 64.5 in | Wt 170.6 lb

## 2023-11-21 DIAGNOSIS — G4733 Obstructive sleep apnea (adult) (pediatric): Secondary | ICD-10-CM

## 2023-11-21 DIAGNOSIS — Z1211 Encounter for screening for malignant neoplasm of colon: Secondary | ICD-10-CM | POA: Diagnosis not present

## 2023-11-21 DIAGNOSIS — F17200 Nicotine dependence, unspecified, uncomplicated: Secondary | ICD-10-CM | POA: Diagnosis not present

## 2023-11-21 NOTE — Patient Instructions (Addendum)
 Recommend MASK FITTING ASSESSMENT IN [place] Recommend 10 pound weight loss by next visit  Avoid Allergens and Irritants Avoid secondhand smoke Avoid SICK contacts Recommend  Masking  when appropriate Recommend Keep up-to-date with vaccinations  Recommend Referral for Lung Cancer screening program

## 2023-11-21 NOTE — Progress Notes (Signed)
 Name: Regina Nash MRN: [id number] DOB: 11/12/1950    CHIEF COMPLAINT:  Follow-up assessment for OSA   HISTORY OF PRESENT ILLNESS: Discussed sleep data and reviewed with patient.  Encouraged proper weight management.  HST August 2024 shows moderate OSA AHI 17  No exacerbation at this time No evidence of heart failure at this time No evidence or signs of infection at this time No respiratory distress No fevers, chills, nausea, vomiting, diarrhea No evidence of lower extremity edema No evidence hemoptysis  Patient is having significant issues with her mask Has headaches Recommend mask fitting services   I have discussed all options with patient at this time Assess for hypoglossal nerve stimulator after trying CPAP for 30 days Referral to dental office for oral appliance and repeat sleep study test Aggressive weight loss and repeat HST   Patient has chosen Plan to reassess mask and CPAP compliance Plan to lose weight 10 pounds at next visit  PAST MEDICAL HISTORY :   has a past medical history of Allergy, Anxiety, Arrhythmia, Arthritis, Asthma, Chronic bronchitis (HCC), Chronic kidney disease, Depression, Fatty liver, Gallstones, Genital warts, GERD (gastroesophageal reflux disease), History of recurrent UTIs, Hyperglycemia, IBS (irritable bowel syndrome), Migraine, PTSD (post-traumatic stress disorder), Sleep apnea, Substance abuse (HCC), and Urine incontinence.  has a past surgical history that includes Tubal ligation. Prior to Admission medications   Medication Sig Start Date End Date Taking? Authorizing Provider  ergocalciferol  (DRISDOL ) 1.25 MG (50000 UT) capsule Take 1 capsule (50,000 Units total) by mouth once a week. 07/26/23  Yes Thersia Flax, MD  ibuprofen  (ADVIL ) 800 MG tablet Take 1 tablet (800 mg total) by mouth every 8 (eight) hours as needed. 06/16/23  Yes Barrett Lick, MD  omeprazole (PRILOSEC) 20 MG capsule Take 20 mg by mouth daily.   Yes [provider]  propranolol  (INDERAL ) 10 MG tablet Take 1 tablet (10 mg total) by mouth 3 (three) times daily as needed. Patient not taking: Reported on 08/29/2023 01/04/23   Gollan, Timothy J, MD   Allergies  Allergen Reactions   Codeine Other (See Comments)   Erythromycin Other (See Comments)    abdominal pain   Penicillins     FAMILY HISTORY:  family history includes Alcohol abuse in her father and mother; Alcoholism in an other family member; Anxiety disorder in her mother, sister, and sister; Arthritis in an other family member; Breast cancer in her paternal aunt; Cancer in an other family member; Depression in her sister; Rheum arthritis in her maternal grandfather. SOCIAL HISTORY:  reports that she has been smoking cigarettes. She has never used smokeless tobacco. She reports that she does not drink alcohol and does not use drugs.  BP 110/70 (BP Location: Right Arm, Patient Position: Sitting, Cuff Size: Normal)   Pulse 75   Temp 98.1 F (36.7 C) (Oral)   Ht 5' 4.5" (1.638 m)   Wt 170 lb 9.6 oz (77.4 kg)   LMP 02/16/2010 (Exact Date)   SpO2 96%   BMI 28.83 kg/m   Review of Systems: Gen:  Denies  fever, sweats, chills weight loss  HEENT: Denies blurred vision, double vision, ear pain, eye pain, hearing loss, nose bleeds, sore throat Cardiac:  No dizziness, chest pain or heaviness, chest tightness,edema, No JVD Resp:   No cough, -sputum production, -shortness of breath,-wheezing, -hemoptysis,  Other:  All other systems negative   Physical Examination:   General Appearance: No distress  EYES PERRLA, EOM intact.   NECK Supple, No  JVD Pulmonary: normal breath sounds, No wheezing.  CardiovascularNormal S1,S2.  No m/r/g.   Abdomen: Benign, Soft, non-tender. Neurology UE/LE 5/5 strength, no focal deficits Ext pulses intact, cap refill intact ALL OTHER ROS ARE NEGATIVE    ASSESSMENT AND PLAN SYNOPSIS  73 year old pleasant patient with signs and symptoms of excessive  daytime sleepiness with  underlying diagnosis of moderate obstructive sleep apnea with AHI 17 in the setting  deconditioned state  Assessment of OSA Moderate AHI 17 Be aware of reduced alertness and do not drive or operate heavy machinery if experiencing this or drowsiness.  Exercise encouraged, as tolerated. Encouraged proper weight management.  Recommend mask fitting assessment in [place]   Overweight -recommend significant weight loss -recommend changing diet Weight loss goal of 10 pounds on next visit  Deconditioned state -Recommend increased daily activity and exercise  Tobacco abuse Smoking cessation counseling Recommend lung cancer screening program   MEDICATION ADJUSTMENTS/LABS AND TESTS ORDERED: Mask fitting services in [place] We referral to lung cancer screening program Avoid Allergens and Irritants Avoid secondhand smoke Avoid SICK contacts Recommend  Masking  when appropriate Recommend Keep up-to-date with vaccinations   CURRENT MEDICATIONS REVIEWED AT LENGTH WITH PATIENT TODAY   Patient  satisfied with Plan of action and management. All questions answered  Follow up  3 months   I spent a total of  41 minutes reviewing chart data, face-to-face evaluation with the patient, counseling and coordination of care as detailed above.    Lady Pier, M.D.  [facility]  Medical Director [facility] Medical Director [facility] Cardio-Pulmonary Department

## 2023-11-29 LAB — COLOGUARD: COLOGUARD: POSITIVE — AB

## 2023-11-30 ENCOUNTER — Encounter: Payer: Self-pay | Admitting: Internal Medicine

## 2023-11-30 ENCOUNTER — Ambulatory Visit: Payer: Self-pay | Admitting: Internal Medicine

## 2023-11-30 NOTE — Addendum Note (Signed)
 Addended by: Thersia Flax on: 11/30/2023 10:53 AM   Modules accepted: Orders

## 2023-12-05 ENCOUNTER — Encounter: Payer: Self-pay | Admitting: Dermatology

## 2023-12-05 ENCOUNTER — Ambulatory Visit (INDEPENDENT_AMBULATORY_CARE_PROVIDER_SITE_OTHER): Payer: Medicare Other | Admitting: Dermatology

## 2023-12-05 DIAGNOSIS — L918 Other hypertrophic disorders of the skin: Secondary | ICD-10-CM

## 2023-12-05 DIAGNOSIS — D1801 Hemangioma of skin and subcutaneous tissue: Secondary | ICD-10-CM

## 2023-12-05 DIAGNOSIS — W908XXA Exposure to other nonionizing radiation, initial encounter: Secondary | ICD-10-CM

## 2023-12-05 DIAGNOSIS — L578 Other skin changes due to chronic exposure to nonionizing radiation: Secondary | ICD-10-CM

## 2023-12-05 DIAGNOSIS — L814 Other melanin hyperpigmentation: Secondary | ICD-10-CM

## 2023-12-05 DIAGNOSIS — Z1283 Encounter for screening for malignant neoplasm of skin: Secondary | ICD-10-CM | POA: Diagnosis not present

## 2023-12-05 DIAGNOSIS — D492 Neoplasm of unspecified behavior of bone, soft tissue, and skin: Secondary | ICD-10-CM

## 2023-12-05 DIAGNOSIS — L821 Other seborrheic keratosis: Secondary | ICD-10-CM

## 2023-12-05 DIAGNOSIS — L570 Actinic keratosis: Secondary | ICD-10-CM

## 2023-12-05 DIAGNOSIS — D229 Melanocytic nevi, unspecified: Secondary | ICD-10-CM

## 2023-12-05 NOTE — Progress Notes (Signed)
 Follow-Up Visit   Subjective  Regina Nash is a 73 y.o. female who presents for the following: Skin Cancer Screening and Full Body Skin Exam. No personal Hx of skin cancer or dysplastic nevi.   The patient presents for Total-Body Skin Exam (TBSE) for skin cancer screening and mole check. The patient has spots, moles and lesions to be evaluated, some may be new or changing and the patient may have concern these could be cancer.    The following portions of the chart were reviewed this encounter and updated as appropriate: medications, allergies, medical history  Review of Systems:  No other skin or systemic complaints except as noted in HPI or Assessment and Plan.  Objective  Well appearing patient in no apparent distress; mood and affect are within normal limits.  A full examination was performed including scalp, head, eyes, ears, nose, lips, neck, chest, axillae, abdomen, back, buttocks, bilateral upper extremities, bilateral lower extremities, hands, feet, fingers, toes, fingernails, and toenails. All findings within normal limits unless otherwise noted below.   Relevant physical exam findings are noted in the Assessment and Plan.  Nasal bridge x1 Erythematous thin papules/macules with gritty scale.        Assessment & Plan   SKIN CANCER SCREENING PERFORMED TODAY.  ACTINIC DAMAGE - Chronic condition, secondary to cumulative UV/sun exposure - diffuse scaly erythematous macules with underlying dyspigmentation - Recommend daily broad spectrum sunscreen SPF 30+ to sun-exposed areas, reapply every 2 hours as needed.  - Staying in the shade or wearing long sleeves, sun glasses (UVA+UVB protection) and wide brim hats (4-inch brim around the entire circumference of the hat) are also recommended for sun protection.  - Call for new or changing lesions.  LENTIGINES, SEBORRHEIC KERATOSES, HEMANGIOMAS - Benign normal skin lesions - Benign-appearing - Call for any  changes  MELANOCYTIC NEVI - Tan-brown and/or pink-flesh-colored symmetric macules and papules - Benign appearing on exam today - Observation - Call clinic for new or changing moles - Recommend daily use of broad spectrum spf 30+ sunscreen to sun-exposed areas.    STUCCO KERATOSES - Stuck-on, rough papules at B/L legs and ankles  - Benign-appearing - Discussed benign etiology and prognosis. - Observe - Call for any changes  Acrochordons (Skin Tags) - Fleshy, skin-colored pedunculated papules at neck - Benign appearing.  - Observe. - If desired, they can be removed with an in office procedure that is not covered by insurance. - Please call the clinic if you notice any new or changing lesions.   Neoplasm of skin  Exam:  Violaceous scaly papule with dilated ostia and telangiectasias, yellowish center, at left lateral leg below knee  Treatment:  Recommend biopsy, patient deferred biopsy at this time. Return in 3 months for recheck and possible biopsy. Do not pick or scratch area before returning to office.      AK (ACTINIC KERATOSIS) Nasal bridge x1 Actinic keratoses are precancerous spots that appear secondary to cumulative UV radiation exposure/sun exposure over time. They are chronic with expected duration over 1 year. A portion of actinic keratoses will progress to squamous cell carcinoma of the skin. It is not possible to reliably predict which spots will progress to skin cancer and so treatment is recommended to prevent development of skin cancer.  Recommend daily broad spectrum sunscreen SPF 30+ to sun-exposed areas, reapply every 2 hours as needed.  Recommend staying in the shade or wearing long sleeves, sun glasses (UVA+UVB protection) and wide brim hats (4-inch brim around the entire  circumference of the hat). Call for new or changing lesions. Destruction of lesion - Nasal bridge x1 Complexity: simple   Destruction method: cryotherapy   Informed consent: discussed  and consent obtained   Timeout:  patient name, date of birth, surgical site, and procedure verified Lesion destroyed using liquid nitrogen: Yes   Region frozen until ice ball extended beyond lesion: Yes   Cryo cycles: 1 or 2. Outcome: patient tolerated procedure well with no complications   Post-procedure details: wound care instructions given   Additional details:  Prior to procedure, discussed risks of blister formation, small wound, skin dyspigmentation, or rare scar following cryotherapy. Recommend Vaseline ointment to treated areas while healing.  MULTIPLE BENIGN NEVI   LENTIGINES   ACTINIC ELASTOSIS   SEBORRHEIC KERATOSES   CHERRY ANGIOMA   ACROCHORDON   Return in about 1 year (around 12/04/2024) for TBSE, 3 months recheck spot on leg.  I, Jill Parcell, CMA, am acting as scribe for Harris Liming, MD.   Documentation: I have reviewed the above documentation for accuracy and completeness, and I agree with the above.  Harris Liming, MD

## 2023-12-05 NOTE — Patient Instructions (Addendum)

## 2023-12-06 ENCOUNTER — Encounter: Admitting: Nurse Practitioner

## 2023-12-06 ENCOUNTER — Other Ambulatory Visit: Payer: Self-pay

## 2023-12-06 ENCOUNTER — Telehealth: Payer: Self-pay | Admitting: Acute Care

## 2023-12-06 DIAGNOSIS — F17210 Nicotine dependence, cigarettes, uncomplicated: Secondary | ICD-10-CM

## 2023-12-06 DIAGNOSIS — Z122 Encounter for screening for malignant neoplasm of respiratory organs: Secondary | ICD-10-CM

## 2023-12-06 DIAGNOSIS — Z87891 Personal history of nicotine dependence: Secondary | ICD-10-CM

## 2023-12-06 NOTE — Telephone Encounter (Signed)
 Lung Cancer Screening Narrative/Criteria Questionnaire (Cigarette Smokers Only- No Cigars/Pipes/vapes)   Regina Nash   SDMV:01/01/24 at 230pm/[facility]                                           21-Mar-1951              LDCT: 01/02/24 at 330p/[facility]    73 y.o.   Phone: [phone]  Lung Screening Narrative (confirm age 9-77 yrs Medicare / 50-80 yrs Private pay insurance)   Insurance information:[employer] medicare   Referring Provider:K[facility]   This screening involves an initial phone call with a team member from our program. It is called a shared decision making visit. The initial meeting is required by insurance and Medicare to make sure you understand the program. This appointment takes about 15-20 minutes to complete. The CT scan will completed at a separate date/time. This scan takes about 5-10 minutes to complete and you may eat and drink before and after the scan.  Criteria questions for Lung Cancer Screening:   Are you a current or former smoker? Current Age began smoking: 73 yo   If you are a former smoker, what year did you quit smoking? Quit for 10 years once   To calculate your smoking history, I need an accurate estimate of how many packs of cigarettes you smoked per day and for how many years. (Not just the number of PPD you are now smoking)   Years smoking 38 x Packs per day 1 to 1.5 = Pack years 47   (at least 20 pack yrs) *recently cut back to 3 cigs per day  (Make sure they understand that we need to know how much they have smoked in the past, not just the number of PPD they are smoking now)  Do you have a personal history of cancer?  No    Do you have a family history of cancer? No  Are you coughing up blood?  No  Have you had unexplained weight loss of 15 lbs or more in the last 6 months? No  It looks like you meet all criteria.     Additional information: N/A

## 2023-12-16 NOTE — Telephone Encounter (Signed)
 Pt would like update on referral

## 2023-12-17 ENCOUNTER — Encounter: Admitting: Family

## 2023-12-24 ENCOUNTER — Encounter: Payer: Self-pay | Admitting: Internal Medicine

## 2023-12-25 ENCOUNTER — Ambulatory Visit
Admission: RE | Admit: 2023-12-25 | Discharge: 2023-12-25 | Disposition: A | Discharge status: Home / Self Care | Source: Ambulatory Visit | Attending: Internal Medicine | Admitting: Internal Medicine

## 2023-12-25 DIAGNOSIS — Z1231 Encounter for screening mammogram for malignant neoplasm of breast: Secondary | ICD-10-CM | POA: Insufficient documentation

## 2023-12-25 DIAGNOSIS — Z780 Asymptomatic menopausal state: Secondary | ICD-10-CM | POA: Diagnosis not present

## 2024-01-01 ENCOUNTER — Encounter: Admitting: *Deleted

## 2024-01-01 ENCOUNTER — Encounter: Payer: Self-pay | Admitting: Emergency Medicine

## 2024-01-01 NOTE — Progress Notes (Signed)
 Attempted to reach pt three times and unable to.  Left message to call office and ask to speak with Regina Nash or Regina Nash Failing to get [facility] call rescheduled

## 2024-01-01 NOTE — Patient Instructions (Signed)

## 2024-01-02 ENCOUNTER — Ambulatory Visit: Admission: RE | Admit: 2024-01-02 | Source: Ambulatory Visit

## 2024-01-24 ENCOUNTER — Ambulatory Visit: Admitting: *Deleted

## 2024-01-24 DIAGNOSIS — F17210 Nicotine dependence, cigarettes, uncomplicated: Secondary | ICD-10-CM | POA: Diagnosis not present

## 2024-01-24 NOTE — Progress Notes (Signed)
 Virtual Visit via Telephone Note  I connected with Glendale Duet on 01/24/24 at  3:00 PM EDT by telephone and verified that I am speaking with the correct person using two identifiers.  Location: Patient: in home Provider: [address] [place], [place], [address]     Shared Decision Making Visit Lung Cancer Screening Program [phone])   Eligibility: Age 73 y.o. Pack Years Smoking History Calculation 47 (# packs/per year x # years smoked) Recent History of coughing up blood  no Unexplained weight loss? no ( >Than 15 pounds within the last 6 months ) Prior History Lung / other cancer no (Diagnosis within the last 5 years already requiring surveillance chest CT Scans). Smoking Status Current Smoker Former Smokers: Years since quit:  NA  Quit Date: NA  Visit Components: Discussion included one or more decision making aids. yes Discussion included risk/benefits of screening. yes Discussion included potential follow up diagnostic testing for abnormal scans. yes Discussion included meaning and risk of over diagnosis. yes Discussion included meaning and risk of False Positives. yes Discussion included meaning of total radiation exposure. yes  Counseling Included: Importance of adherence to annual lung cancer LDCT screening. yes Impact of comorbidities on ability to participate in the program. yes Ability and willingness to under diagnostic treatment. yes  Smoking Cessation Counseling: Current Smokers:  Discussed importance of smoking cessation. yes Information about tobacco cessation classes and interventions provided to patient. yes Patient provided with ticket for LDCT Scan. yes Symptomatic Patient.  NA  Counseling NA Diagnosis Code: Tobacco Use Z72.0 Asymptomatic Patient yes  Counseling (Intermediate counseling: > three minutes counseling) [id number] Former Smokers:  Discussed the importance of maintaining cigarette abstinence. yes Diagnosis Code: Personal History of  Nicotine Dependence. S12.108 Information about tobacco cessation classes and interventions provided to patient. Yes Patient provided with ticket for LDCT Scan. yes Written Order for Lung Cancer Screening with LDCT placed in Epic. Yes (CT Chest Lung Cancer Screening Low Dose W/O CM) [id number] Z12.2-Screening of respiratory organs Z87.891-Personal history of nicotine dependence   Josette Ranger, RN 01/24/24

## 2024-01-24 NOTE — Patient Instructions (Signed)

## 2024-01-27 ENCOUNTER — Ambulatory Visit
Admission: RE | Admit: 2024-01-27 | Discharge: 2024-01-27 | Disposition: A | Discharge status: Home / Self Care | Source: Ambulatory Visit | Attending: Acute Care | Admitting: Acute Care

## 2024-01-27 ENCOUNTER — Telehealth: Payer: Self-pay

## 2024-01-27 DIAGNOSIS — Z122 Encounter for screening for malignant neoplasm of respiratory organs: Secondary | ICD-10-CM | POA: Insufficient documentation

## 2024-01-27 DIAGNOSIS — Z87891 Personal history of nicotine dependence: Secondary | ICD-10-CM | POA: Insufficient documentation

## 2024-01-27 DIAGNOSIS — F17210 Nicotine dependence, cigarettes, uncomplicated: Secondary | ICD-10-CM | POA: Insufficient documentation

## 2024-01-27 NOTE — Telephone Encounter (Signed)
 Copied from CRM [id number]. Topic: General - Billing Inquiry >> Jan 27, 2024  4:21 PM Regina Nash wrote: Patient called says her insurance denied payment for bone density from 12/25/23 due to services submitted not following CMS coding guidelines - she didn't know who to call so she started with the office since Dr. Marylynn put order in.Please call patient with an update at [phone] (M)

## 2024-01-28 NOTE — Telephone Encounter (Signed)
 noted

## 2024-01-28 NOTE — Telephone Encounter (Signed)
 Not sure why her insurance is not wanting to cover the dexa scan cause we used the dx code that we always use for screening for osteoporosis. Who does this need to be sent to or does pt need to call billing or her insurance company?

## 2024-01-31 ENCOUNTER — Ambulatory Visit: Admitting: Medical

## 2024-02-04 NOTE — Telephone Encounter (Signed)
 Called Pharmacy and the phone kept ringing.

## 2024-02-06 ENCOUNTER — Other Ambulatory Visit: Payer: Self-pay

## 2024-02-06 ENCOUNTER — Ambulatory Visit (INDEPENDENT_AMBULATORY_CARE_PROVIDER_SITE_OTHER): Admitting: Nurse Practitioner

## 2024-02-06 VITALS — BP 118/76 | HR 73 | Temp 97.4°F | Ht 64.5 in | Wt 169.4 lb

## 2024-02-06 DIAGNOSIS — Z87891 Personal history of nicotine dependence: Secondary | ICD-10-CM

## 2024-02-06 DIAGNOSIS — Z0000 Encounter for general adult medical examination without abnormal findings: Secondary | ICD-10-CM

## 2024-02-06 DIAGNOSIS — R1011 Right upper quadrant pain: Secondary | ICD-10-CM | POA: Diagnosis not present

## 2024-02-06 DIAGNOSIS — R195 Other fecal abnormalities: Secondary | ICD-10-CM

## 2024-02-06 DIAGNOSIS — F17210 Nicotine dependence, cigarettes, uncomplicated: Secondary | ICD-10-CM

## 2024-02-06 DIAGNOSIS — Z122 Encounter for screening for malignant neoplasm of respiratory organs: Secondary | ICD-10-CM
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
## 2024-02-06 NOTE — Patient Instructions (Signed)
 Protein in Different Foods Protein is a nutrient that you get through your diet. Depending on your health, you may need more or less protein in your diet. You should eat a variety of protein foods to make sure that you get all the nutrients you need. Talk with your health care provider about how much protein you need each day. Protein helps your body: Fix and make cells and tissues. Fight infection. Have energy. Grow and develop. What are tips for getting more protein in your diet? Try to replace processed carbohydrates with high-quality protein. Snack on nuts and seeds instead of chips. Replace baked desserts with [place] yogurt. Eat protein foods from both plant and animal sources. Add beans and peas to salads, soups, and side dishes. Include a protein food with each meal and snack. Eat more whole grains. Add powdered milk or protein powder to hot cereals. Add peanut butter to toast or crackers instead of butter. Reading food labels You can find the amount of protein in a food item by looking at the nutrition facts label. Use the total grams listed to help you reach your daily goal. What foods are high in protein?  High-protein foods contain 4 grams (g) or more of protein per serving. They include: Grains Quinoa (cooked) -- 1 cup (185 g) has 8 g of protein. Whole wheat pasta (cooked) -- 1 cup (140 g) has 6 g of protein. Dairy Cottage cheese --  cup (114 g) has 13.4 g of protein. Milk -- 1 cup (237 mL) has 8 g of protein. Cheese (hard) -- 1 oz (28 g) has 7 g of protein. Yogurt, regular -- 6 oz (170 g) has 8 g of protein. Greek yogurt -- 6 oz (200 g) has 18 g protein. Meat Beef, ground sirloin (cooked) -- 3 oz (85 g) has 24 g of protein. Chicken breast, boneless and skinless (cooked) -- 3 oz (85 g) has 25 g of protein. Egg -- 1 egg has 6 g of protein. Fish, filet (cooked) -- 3 oz (85g ) has 21-23 g of protein. Lamb (cooked) -- 3 oz (85 g) has 24 g of protein. Pork tenderloin  (cooked) -- 3 oz (85 g) has 23 g of protein. Tuna (canned in water) -- 3 oz (85 g) has 20 g of protein. Plant protein Garbanzo beans (canned or cooked) --  cup (130 g) has 6-7 g of protein. Kidney beans (canned or cooked) --  cup (130 g) has 6-7 g of protein. Nuts (peanuts, pistachios, almonds) -- 1 oz (28 g) has 6 g of protein. Peanut butter -- 1 oz (32 g) has 7-8 g of protein. Pumpkin seeds -- 1 oz (28 g) has 8.5 g of protein. Soybeans (roasted) -- 1 oz (28 g) has 8 g of protein. Soybeans (cooked) --  cup (90 g) has 11 g of protein. Soy milk -- 1 cup (250 mL) has 5-10 g of protein. Soy or vegetable patty -- 1 patty has 11 g of protein. Sunflower seeds -- 1 oz (28 g) has 5.5 g of protein. Buckwheat -- 1 oz (33 g) has 4.3 g of protein. Tofu (firm) --  cup (124 g) has 20 g of protein. Tempeh --  cup (83 g) has 16 g of protein. The items listed above may not be a complete list of foods that are high in protein. Actual amounts of protein may be different depending on processing. Talk with an expert in healthy eating called a dietitian to learn more. What foods  are low in protein?  Low-protein foods contain 3 grams (g) or less of protein per serving. They include: Fruits Fruit or vegetable juice --  cup (125 mL) has 1 g of protein. Vegetables Beets (raw or cooked) --  cup (68 g) has 1.5 g of protein. Broccoli (raw or cooked) --  cup (44 g) has 2 g of protein. Collard greens (raw or cooked) --  cup (42 g) has 2 g of protein. Green beans (raw or cooked) --  cup (83 g) has 1 g of protein. Green peas (canned) --  cup (80 g) has 3.5 g of protein. Potato (baked with skin) -- 1 medium potato (173 g) has 3 g of protein. Spinach (cooked) --  cup (90 g) has 3 g of protein. Squash (cooked) --  cup (90 g) has 1.5 g of protein. Avocado -- 1 cup (146 g) has 2.7 g of protein. Grains Bran cereal --  cup (30 g) has 3 g of protein. Whole wheat bread -- 1 slice has 3 g of protein. Corn  (fresh or cooked) --  cup (77 g) has 2 g of protein. Flour tortilla -- One 6-inch (15 cm) tortilla has 2.5 g of protein. Muffins -- 1 small muffin (2 oz or 57 g) has 3 g of protein. Oatmeal (cooked) --  cup (40 g) has 3 g of protein. Brown rice (cooked) --  cup (78 g) has 2.5 g of protein. Dairy Cream cheese -- 1 oz (29 g) has 2 g of protein. Creamer (half-and-half) -- 1 oz (29 mL) has 1 g of protein. Frozen yogurt --  cup (72 g) has 3 g of protein. Sour cream --  cup (75 g) has 2.5 g of protein. The items listed above may not be a complete list of foods that are low in protein. Actual amounts of protein may be different depending on processing. Talk with an expert in healthy eating to learn more. This information is not intended to replace advice given to you by your health care provider. Make sure you discuss any questions you have with your health care provider. Document Revised: 11/26/2022 Document Reviewed: 11/26/2022 Elsevier Patient Education  2024 [employer].

## 2024-02-07 ENCOUNTER — Encounter: Payer: Self-pay | Admitting: Nurse Practitioner

## 2024-02-10 ENCOUNTER — Telehealth: Payer: Self-pay | Admitting: Internal Medicine

## 2024-02-10 ENCOUNTER — Ambulatory Visit: Payer: Self-pay

## 2024-02-10 ENCOUNTER — Other Ambulatory Visit

## 2024-02-10 NOTE — Telephone Encounter (Signed)
 First attempt; left vm   Copied from CRM #[id number]. Topic: Clinical - Red Word Triage >> Feb 10, 2024  1:45 PM Regina Nash wrote: Reason for RMF:Ejupzwu called stating someone called her and she was returning a call, I told the patient that are labs were canceled today by her provider. The patient stated her labs are needed because she is about to fall out on the floor. The patient stated she is fatigue and something is not right. Patient blood pressure last night is 112/53. Patient stated she saw NP Vincente and she ordered the labs

## 2024-02-10 NOTE — Telephone Encounter (Signed)
 Bp 112/53 is a reading from yesterday and patient has no way to check it today at this time She states she just feels tired all the time---seen recently for this and following up because she was told Chelsea Aurora NP wanted updated labs but they ended up being cancelled today   FYI Only or Action Required?: Action required by provider: clinical question for provider and Question about Lab Work and PCP's recommendation on daily protein intake.  Patient was last seen in primary care on 02/06/2024 by Aurora Chelsea, NP.  Called Nurse Triage reporting Hypotension.  Symptoms began 1 week ago.  Interventions attempted: Rest, hydration, or home remedies.  Symptoms are: unchanged.  Triage Disposition: See PCP When Office is Open (Within 3 Days)  Patient/caregiver understands and will follow disposition?: No, wishes to speak with PCP                           Copied from CRM #[id number]. Topic: Clinical - Red Word Triage >> Feb 10, 2024  2:04 PM Aisha D wrote: Red Word that prompted transfer to Nurse Triage: BP is low, pass out  Pt stated that her BP is 112/53 and she feels like she is about to fall out. Pt stated that she has no energy and would like to speak with a nurse. Reason for Disposition  Patient wants doctor (or NP/PA) to measure BP  Answer Assessment - Initial Assessment Questions Patient states that she is confused because she saw Chelsea Aurora, NP who wanted to get some updated labs done per her visit 02/10/2024  but patient's PCP Dr Verneita Kettering cancelled her labwork that was supposed to be done today.   Patient wants to know what her goal for protein should be every day?   Patient is directed to the Protein List that was sent to her via MyChart  Eats soup mostly for her diet Patient rarely eats meat  Patient states that if the office needs to see her again she will come back  This RN didn't see any openings in the next few days so if patient  needs to be scheduled---please reach out to her She states that nothing has changed since she was last evaluated at the office She just wants to know what is going on with this lab work and what her protein goal should be every day Patient is also advised that if anything worsens to call us  back or go to the Emergency Room. Patient verbalized understanding       1. BLOOD PRESSURE: What is your blood pressure? Did you take at least two measurements 5 minutes apart?     Pt states 112/53 yesterday 2. ONSET: When did you take your blood pressure?     Pt states 112/53 yesterday 3. HOW: How did you take your blood pressure? (e.g., visiting nurse, automatic home BP monitor)     Yesterday with daughter 4. HISTORY: Do you have a history of low blood pressure? What is your blood pressure normally?     Not to my knowledge 5. MEDICINES: Are you taking any medicines for blood pressure? If Yes, ask: Have they been changed recently?     No 6. PULSE RATE: Do you know what your pulse rate is?      Around 70 7. OTHER SYMPTOMS: Have you been sick recently? Have you had a recent injury?     Just recent fatigue  Protocols used: Blood Pressure - Low-A-AH

## 2024-02-10 NOTE — Telephone Encounter (Signed)
 Patient need lab orders, on lab schedule for this morning. Thanks

## 2024-02-11 ENCOUNTER — Other Ambulatory Visit

## 2024-02-11 ENCOUNTER — Other Ambulatory Visit (INDEPENDENT_AMBULATORY_CARE_PROVIDER_SITE_OTHER)

## 2024-02-11 DIAGNOSIS — Z0000 Encounter for general adult medical examination without abnormal findings: Secondary | ICD-10-CM

## 2024-02-11 NOTE — Telephone Encounter (Signed)
 Patient is scheduled for fasting labs today at 2:30.

## 2024-02-11 NOTE — Telephone Encounter (Signed)
 Please call pt to schedule the fasting lab appointment. The labs are ordered. Please check how the pt is doing?

## 2024-02-11 NOTE — Telephone Encounter (Signed)
 Noted

## 2024-02-11 NOTE — Telephone Encounter (Signed)
 Patient states she is a little better today than 02/10/24.

## 2024-02-12 LAB — COMPREHENSIVE METABOLIC PANEL WITH GFR
ALT: 10 U/L (ref 0–35)
AST: 13 U/L (ref 0–37)
Albumin: 4 g/dL (ref 3.5–5.2)
Alkaline Phosphatase: 63 U/L (ref 39–117)
BUN: 8 mg/dL (ref 6–23)
CO2: 30 meq/L (ref 19–32)
Calcium: 9.3 mg/dL (ref 8.4–10.5)
Chloride: 103 meq/L (ref 96–112)
Creatinine, Ser: 0.8 mg/dL (ref 0.40–1.20)
GFR: 74.33 mL/min (ref >60.00)
Glucose, Bld: 90 mg/dL (ref 70–99)
Potassium: 3.9 meq/L (ref 3.5–5.1)
Sodium: 140 meq/L (ref 135–145)
Total Bilirubin: 0.4 mg/dL (ref 0.2–1.2)
Total Protein: 6.5 g/dL (ref 6.0–8.3)

## 2024-02-12 LAB — LIPID PANEL
Cholesterol: 216 mg/dL — ABNORMAL HIGH (ref 0–200)
HDL: 43.1 mg/dL (ref >39.00)
LDL Cholesterol: 140 mg/dL — ABNORMAL HIGH (ref 0–99)
NonHDL: 172.81
Total CHOL/HDL Ratio: 5
Triglycerides: 163 mg/dL — ABNORMAL HIGH (ref 0.0–149.0)
VLDL: 32.6 mg/dL (ref 0.0–40.0)

## 2024-02-12 LAB — VITAMIN D 25 HYDROXY (VIT D DEFICIENCY, FRACTURES): VITD: 60.33 ng/mL (ref 30.00–100.00)

## 2024-02-12 LAB — VITAMIN B12: Vitamin B-12: 698 pg/mL (ref 211–911)

## 2024-02-13 ENCOUNTER — Ambulatory Visit (INDEPENDENT_AMBULATORY_CARE_PROVIDER_SITE_OTHER): Admitting: Nurse Practitioner

## 2024-02-13 ENCOUNTER — Encounter: Payer: Self-pay | Admitting: Nurse Practitioner

## 2024-02-13 VITALS — BP 122/76 | HR 74 | Temp 97.7°F | Ht 64.5 in | Wt 169.8 lb

## 2024-02-13 DIAGNOSIS — R5383 Other fatigue: Secondary | ICD-10-CM

## 2024-02-13 DIAGNOSIS — E785 Hyperlipidemia, unspecified: Secondary | ICD-10-CM

## 2024-02-13 DIAGNOSIS — R5381 Other malaise: Secondary | ICD-10-CM | POA: Diagnosis not present
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
## 2024-02-16 ENCOUNTER — Encounter: Payer: Self-pay | Admitting: Nurse Practitioner

## 2024-02-16 DIAGNOSIS — Z0000 Encounter for general adult medical examination without abnormal findings: Secondary | ICD-10-CM | POA: Insufficient documentation

## 2024-02-16 NOTE — Assessment & Plan Note (Signed)
 Persistent pain at previous gallbladder surgery site, tender and affecting comfort. -Will check liver function.

## 2024-02-16 NOTE — Assessment & Plan Note (Signed)
 Encouraged patient to consume a balanced diet and regular exercise regimen. Advised to see an eye doctor and dentist annually.  Declined vaccines. Positive cologuard, will refer to GI for colonoscopy. Labs ordered items as outlined.

## 2024-02-17 ENCOUNTER — Ambulatory Visit: Attending: Medical | Admitting: Medical

## 2024-02-17 ENCOUNTER — Encounter: Payer: Self-pay | Admitting: Medical

## 2024-02-17 VITALS — BP 115/74 | HR 67 | Ht 64.0 in | Wt 171.8 lb

## 2024-02-17 DIAGNOSIS — I479 Paroxysmal tachycardia, unspecified: Secondary | ICD-10-CM

## 2024-02-17 DIAGNOSIS — E782 Mixed hyperlipidemia: Secondary | ICD-10-CM

## 2024-02-17 DIAGNOSIS — J438 Other emphysema: Secondary | ICD-10-CM

## 2024-02-17 DIAGNOSIS — I2510 Atherosclerotic heart disease of native coronary artery without angina pectoris: Secondary | ICD-10-CM

## 2024-02-17 DIAGNOSIS — Z720 Tobacco use: Secondary | ICD-10-CM

## 2024-02-17 DIAGNOSIS — G4733 Obstructive sleep apnea (adult) (pediatric): Secondary | ICD-10-CM | POA: Diagnosis not present

## 2024-02-17 DIAGNOSIS — Z79899 Other long term (current) drug therapy: Secondary | ICD-10-CM

## 2024-02-17 MED ORDER — PROPRANOLOL HCL 10 MG PO TABS: 10.0000 mg | ORAL_TABLET | Freq: Three times a day (TID) | ORAL | 3 refills | Status: AC | PRN

## 2024-02-17 NOTE — Patient Instructions (Signed)
 Medication Instructions:  Your physician recommends that you continue on your current medications as directed. Please refer to the Current Medication list given to you today.    *If you need a refill on your cardiac medications before your next appointment, please call your pharmacy*  Lab Work: No labs ordered today    Testing/Procedures: No test ordered today   Follow-Up: At [facility], you and your health needs are our priority.  As part of our continuing mission to provide you with exceptional heart care, our providers are all part of one team.  This team includes your primary Cardiologist (physician) and Advanced Practice Providers or APPs (Physician Assistants and Nurse Practitioners) who all work together to provide you with the care you need, when you need it.  Your next appointment:   1 year(s)  Provider:   You may see Timothy Gollan, MD or one of the following Advanced Practice Providers on your designated Care Team:   Lonni Meager, NP Lesley Maffucci, PA-C Bernardino Bring, PA-C Cadence Robstown, PA-C Tylene Lunch, NP Barnie Hila, NP

## 2024-02-17 NOTE — Progress Notes (Signed)
 Cardiology Office Note   Date:  02/17/2024  ID:  Regina Nash, Regina Nash Jul 15, 1951, MRN [id number] PCP: Marylynn Verneita CROME, MD  [facility] Providers Cardiologist:  Evalene Lunger, MD    History of Present Illness Regina Nash is a 73 y.o. female with a h/o f smoking, HLD, OSA not on CPAP, depression/PTSD, calcium score of 83 06/2018 who presents with 1 year follow-up.   The patient was last seen 12/2022 for pre-op evaluation. Patient wanted to be seen by cardiology prior to gall bladder surgery. No further work-up was recommended.   Today, the patient repots she had her gallbladder out but is still having the same pain. She will see GI for colonoscopy/EGD. She was recently diagnosed with emphysema. She denies chest pain. She has SOB from emphysema. She does not uses CPAP due to headache. She has occasional tachycardia, mostly due to lack of sleep. She has not needed propranolol  got tachycardia. She is smoking 1.5 cigarettes daily. She does no formal exercise.   Studies Reviewed EKG Interpretation Date/Time:  Monday February 17 2024 15:43:03 EDT Ventricular Rate:  67 PR Interval:  200 QRS Duration:  78 QT Interval:  420 QTC Calculation: 443 R Axis:   0  Text Interpretation: Normal sinus rhythm Inferior infarct , age undetermined No previous ECGs available Confirmed by Franc[place]ster Mail ([id number]) on 02/17/2024 4:04:39 PM    Cardiac CTA 2019 IMPRESSION: Coronary calcium score of 83. This was 39 th percentile for age and sex matched control.   Heart monitor 04/2018 Sinus rhythm avg HR of 84 bpm. 1 run of Supraventricular Tachycardia/atrial tachycardia occurred lasting 5 beats with a max rate of 118 bpm (avg 112 bpm).  Isolated SVEs were rare (<1.0%), SVE Couplets were rare (<1.0%), and no SVE Triplets were present. Isolated VEs were rare (<1.0%), VE Couplets were rare (<1.0%), and no VE Triplets were present. Ventricular Trigeminy was present.         Physical Exam VS:  BP 115/74 (BP  Location: Left Arm, Patient Position: Sitting, Cuff Size: Normal)   Pulse 67   Ht 5' 4 (1.626 m)   Wt 171 lb 12.8 oz (77.9 kg)   LMP 02/16/2010 (Exact Date)   SpO2 98%   BMI 29.49 kg/m        Wt Readings from Last 3 Encounters:  02/17/24 171 lb 12.8 oz (77.9 kg)  02/13/24 169 lb 12.8 oz (77 kg)  02/06/24 169 lb 6.4 oz (76.8 kg)    GEN: Well nourished, well developed in no acute distress NECK: No JVD; No carotid bruits CARDIAC: RRR, no murmurs, rubs, gallops RESPIRATORY:  diffusely diminished ABDOMEN: Soft, non-tender, non-distended EXTREMITIES:  No edema; No deformity   ASSESSMENT AND PLAN  pSVT Patient reports occasional palpitations that are worse with lack of sleep and certain foods. She has not needed propranolol  recently.  We will refill propranolol  10 mg to use as needed for palpitations.  OSA not on CPAP Patient reports severe headache with CPAP machine and therefore does not use it.  Coronary calcium on CT 2019 Coronary calcium score 83 in 2019.  Patient denies any chest pain.  She does have shortness of breath which is likely from emphysema.  I recommended walking program 20 minutes 3 times weekly.    Emphysema Tobacco use Patient is down to smoking 1-1/2 cigarettes daily, complete cessation encouraged.  HLD  LDL of 140, TG 163 and 216, HDL 43.  LDL goal less than 70.  She recently discussed with primary care  who recommended diet changes.  Patient will likely require statin as ASCVD risk 24.2% risk of CV event in the next 10 years, mod to intensity statin recommended. Can re-check cholesterol levels in 3-6 months.        Dispo: Follow-up in 1 year  Signed, Creta Dorame VEAR Fishman, PA-C

## 2024-02-23 NOTE — Assessment & Plan Note (Signed)
 Labs with no sign of anemia, Vit D and B12 WNL.  -Encouraged to increased fluid intake, sleep hygiene and exercise.

## 2024-02-23 NOTE — Assessment & Plan Note (Signed)
 Lab Results  Component Value Date   CHOL 216 (H) 02/11/2024   HDL 43.10 02/11/2024   LDLCALC 140 (H) 02/11/2024   LDLDIRECT 147 (H) 01/04/2023   TRIG 163.0 (H) 02/11/2024   CHOLHDL 5 02/11/2024  - Advised pt to start on statin therapy. - Pt declined medication at present and prefers lifestyle changes over medication due to statin concerns. - Encourage exercise and dietary modifications. - Reassess cholesterol levels in three months.

## 2024-02-26 NOTE — Progress Notes (Addendum)
 Counseled patient 4 minutes regarding tobacco use.

## 2024-03-02 ENCOUNTER — Ambulatory Visit: Admitting: Nurse Practitioner

## 2024-03-04 ENCOUNTER — Ambulatory Visit: Admitting: Nurse Practitioner

## 2024-03-05 ENCOUNTER — Encounter: Payer: Self-pay | Admitting: Internal Medicine

## 2024-03-05 ENCOUNTER — Ambulatory Visit: Admitting: Internal Medicine

## 2024-03-05 VITALS — BP 126/60 | HR 73 | Temp 98.4°F | Ht 67.0 in | Wt 171.6 lb

## 2024-03-05 DIAGNOSIS — Z122 Encounter for screening for malignant neoplasm of respiratory organs: Secondary | ICD-10-CM | POA: Diagnosis not present

## 2024-03-05 DIAGNOSIS — F17210 Nicotine dependence, cigarettes, uncomplicated: Secondary | ICD-10-CM | POA: Diagnosis not present

## 2024-03-05 DIAGNOSIS — E663 Overweight: Secondary | ICD-10-CM

## 2024-03-05 DIAGNOSIS — G4733 Obstructive sleep apnea (adult) (pediatric): Secondary | ICD-10-CM | POA: Diagnosis not present

## 2024-03-05 DIAGNOSIS — Z720 Tobacco use: Secondary | ICD-10-CM

## 2024-03-05 NOTE — Patient Instructions (Signed)
 Recommend mask assessment referral  Please stop smoking Recommend increased exercise capacity Recommend 10 pound weight loss by next office visit  Avoid Allergens and Irritants Avoid secondhand smoke Avoid SICK contacts Recommend  Masking  when appropriate Recommend Keep up-to-date with vaccinations

## 2024-03-05 NOTE — Assessment & Plan Note (Signed)
 SABRA

## 2024-03-05 NOTE — Progress Notes (Unsigned)
 Name: Regina Nash MRN: [id number] DOB: [birth year]    CHIEF COMPLAINT:  Follow-up assessment for OSA Follow-up assessment for low-dose CT chest   HISTORY OF PRESENT ILLNESS: Discussed sleep data and reviewed with patient.  Encouraged proper weight management.  HST August 2024 shows moderate OSA AHI 17  No exacerbation at this time No evidence of heart failure at this time No evidence or signs of infection at this time No respiratory distress No fevers, chills, nausea, vomiting, diarrhea No evidence of lower extremity edema No evidence hemoptysis   Patient is having significant issues with her mask Has headaches Recommend mask fitting services Aggressive weight loss and repeat HST    LDCT July 2025 No significant lung masses lung nodules noted   PAST MEDICAL HISTORY :   has a past medical history of Allergy, Anemia (As a child), Anxiety, Arrhythmia, Arthritis, Asthma, Cataract, Cholecystitis (06/15/2023), Chronic bronchitis (HCC), Chronic kidney disease, Depression, Emphysema of lung (HCC), Fatty liver, Gallstones, Genital warts, GERD (gastroesophageal reflux disease), Heart murmur (1968), History of recurrent UTIs, Hyperglycemia, IBS (irritable bowel syndrome), Migraine, PTSD (post-traumatic stress disorder), Sleep apnea, Substance abuse (HCC), and Urine incontinence.  has a past surgical history that includes Tubal ligation and Cholecystectomy (June 16, 2023). Prior to Admission medications   Medication Sig Start Date End Date Taking? Authorizing Provider  ergocalciferol  (DRISDOL ) 1.25 MG (50000 UT) capsule Take 1 capsule (50,000 Units total) by mouth once a week. 07/26/23  Yes Marylynn Verneita CROME, MD  ibuprofen  (ADVIL ) 800 MG tablet Take 1 tablet (800 mg total) by mouth every 8 (eight) hours as needed. 06/16/23  Yes Marinda Jayson KIDD, MD  omeprazole (PRILOSEC) 20 MG capsule Take 20 mg by mouth daily.   Yes [provider]  propranolol  (INDERAL ) 10 MG tablet Take 1  tablet (10 mg total) by mouth 3 (three) times daily as needed. Patient not taking: Reported on 08/29/2023 01/04/23   Gollan, Timothy J, MD   Allergies  Allergen Reactions   Codeine Other (See Comments)   Erythromycin Other (See Comments)    abdominal pain   Penicillins     FAMILY HISTORY:  family history includes Alcohol abuse in her father and mother; Alcoholism in an other family member; Anxiety disorder in her daughter, mother, sister, sister, sister, and sister; Arthritis in her paternal grandfather, sister, sister, and another family member; Breast cancer in her paternal aunt; Cancer in her paternal aunt and another family member; Depression in her daughter, mother, sister, sister, and sister; Hearing loss in her maternal grandfather and maternal grandmother; Rheum arthritis in her maternal grandfather; Vision loss in her paternal grandmother. SOCIAL HISTORY:  reports that she has been smoking cigarettes. She has a 5.3 pack-year smoking history. She has never used smokeless tobacco. She reports that she does not currently use alcohol. She reports that she does not use drugs.  BP 126/60   Pulse 73   Temp 98.4 F (36.9 C)   Ht 5' 7 (1.702 m)   Wt 171 lb 9.6 oz (77.8 kg)   LMP 02/16/2010 (Exact Date)   SpO2 97%   BMI 26.88 kg/m     Physical Examination:   General Appearance: No distress  EYES PERRLA, EOM intact.   NECK Supple, No JVD Pulmonary: normal breath sounds, No wheezing.  CardiovascularNormal S1,S2.  No m/r/g.   Abdomen: Benign, Soft, non-tender. Neurology UE/LE 5/5 strength, no focal deficits Ext pulses intact, cap refill intact ALL OTHER ROS ARE NEGATIVE    ASSESSMENT AND PLAN SYNOPSIS  73 year old pleasant patient with signs and symptoms of excessive daytime sleepiness with  underlying diagnosis of moderate obstructive sleep apnea with AHI 17 in the setting  deconditioned state  Assessment & Plan OSA (obstructive sleep apnea) Assessment of OSA Moderate  AHI 17 Be aware of reduced alertness and do not drive or operate heavy machinery if experiencing this or drowsiness.  Exercise encouraged, as tolerated. Encouraged proper weight management.  Recommend mask fitting assessment in [place] Orders:   Desensitization mask fit; Future  Tobacco abuse Smoking Assessment and Cessation Counseling Upon further questioning, Patient smokes 1/2 ppd I have advised patient to quit/stop smoking as soon as possible due to high risk for multiple medical problems Patient is willing to quit smoking  I have advised patient that we can assist and have options of Nicotine replacement therapy. I also advised patient on behavioral therapy and can provide oral medication therapy in conjunction with the other therapies Follow up next Office visit  for assessment of smoking cessation Smoking cessation counseling advised for 4 minutes     Screening for lung cancer July 2025 low-dose CT no significant findings concerning for malignancy Follow-up annually    Overweight  Overweight -recommend significant weight loss -recommend changing diet Weight loss goal of 10 pounds on next visit         MEDICATION ADJUSTMENTS/LABS AND TESTS ORDERED: Mask fitting services in [place] Avoid Allergens and Irritants Avoid secondhand smoke Avoid SICK contacts Recommend  Masking  when appropriate Recommend Keep up-to-date with vaccinations Recommend weight loss    CURRENT MEDICATIONS REVIEWED AT LENGTH WITH PATIENT TODAY   Patient  satisfied with Plan of action and management. All questions answered   Follow up 6 months   I spent a total of 41 minutes dedicated to the care of this patient on the date of this encounter to include pre-visit review of records, face-to-face time with the patient discussing conditions above, post visit ordering of testing, clinical documentation with the electronic health record, making appropriate referrals as documented, and  communicating necessary information to the patient's healthcare team.    The Patient requires high complexity decision making for assessment and support, frequent evaluation and titration of therapies, application of advanced monitoring technologies and extensive interpretation of multiple databases.  Patient satisfied with Plan of action and management. All questions answered    Nickolas Alm Cellar, M.D.  [facility]  Medical Director [facility] Medical Director [facility] Cardio-Pulmonary Department

## 2024-03-09 ENCOUNTER — Ambulatory Visit: Admitting: Dermatology

## 2024-03-09 DIAGNOSIS — L739 Follicular disorder, unspecified: Secondary | ICD-10-CM

## 2024-03-09 DIAGNOSIS — L578 Other skin changes due to chronic exposure to nonionizing radiation: Secondary | ICD-10-CM

## 2024-03-09 DIAGNOSIS — W908XXA Exposure to other nonionizing radiation, initial encounter: Secondary | ICD-10-CM

## 2024-03-09 DIAGNOSIS — L821 Other seborrheic keratosis: Secondary | ICD-10-CM

## 2024-03-09 DIAGNOSIS — D485 Neoplasm of uncertain behavior of skin: Secondary | ICD-10-CM | POA: Diagnosis not present

## 2024-03-09 NOTE — Progress Notes (Signed)
   Follow-Up Visit   Subjective  Regina Nash is a 73 y.o. female who presents for the following: 3 month to recheck spot at left lateral leg below knee.   The following portions of the chart were reviewed this encounter and updated as appropriate: medications, allergies, medical history  Review of Systems:  No other skin or systemic complaints except as noted in HPI or Assessment and Plan.  Objective  Well appearing patient in no apparent distress; mood and affect are within normal limits.   A focused examination was performed of the following areas: Left leg, scalp  Relevant exam findings are noted in the Assessment and Plan.    Assessment & Plan   SEBORRHEIC KERATOSIS - Stuck-on, waxy, tan-brown papules and/or plaques  - Benign-appearing - Discussed benign etiology and prognosis. - Observe - Call for any changes  FOLLICULITIS Exam: Perifollicular erythematous papules and pustules at scalp  Folliculitis occurs due to inflammation of the superficial hair follicle (pore), resulting in acne-like lesions (pus bumps). It can be infectious (bacterial, fungal) or noninfectious (shaving, tight clothing, heat/sweat, medications).  Folliculitis can be acute or chronic and recommended treatment depends on the underlying cause of folliculitis.  Treatment Plan: observe  ACTINIC DAMAGE - chronic, secondary to cumulative UV radiation exposure/sun exposure over time - diffuse scaly erythematous macules with underlying dyspigmentation - Recommend daily broad spectrum sunscreen SPF 30+ to sun-exposed areas, reapply every 2 hours as needed.  - Recommend staying in the shade or wearing long sleeves, sun glasses (UVA+UVB protection) and wide brim hats (4-inch brim around the entire circumference of the hat). - Call for new or changing lesions.  NEOPLASM OF UNCERTAIN BEHAVIOR OF SKIN left lateral leg below knee Ddx resolving ISK SEBORRHEIC KERATOSES   INFLAMED HAIR  FOLLICLE    Return for TBSE, as scheduled, with Dr. Claudene, [place] follow up.  LILLETTE Lonell Drones, RMA, am acting as scribe for Boneta Claudene, MD .   Documentation: I have reviewed the above documentation for accuracy and completeness, and I agree with the above.  Boneta Claudene, MD

## 2024-03-09 NOTE — Patient Instructions (Signed)

## 2024-03-10 DIAGNOSIS — R195 Other fecal abnormalities: Secondary | ICD-10-CM | POA: Diagnosis not present

## 2024-03-10 DIAGNOSIS — R1031 Right lower quadrant pain: Secondary | ICD-10-CM | POA: Diagnosis not present

## 2024-03-12 ENCOUNTER — Other Ambulatory Visit: Payer: Self-pay | Admitting: Gastroenterology

## 2024-03-12 DIAGNOSIS — R195 Other fecal abnormalities: Secondary | ICD-10-CM

## 2024-03-12 DIAGNOSIS — R1031 Right lower quadrant pain: Secondary | ICD-10-CM

## 2024-03-16 ENCOUNTER — Encounter: Payer: Self-pay | Admitting: Dermatology

## 2024-03-18 ENCOUNTER — Ambulatory Visit
Admission: RE | Admit: 2024-03-18 | Discharge: 2024-03-18 | Disposition: A | Discharge status: Home / Self Care | Source: Ambulatory Visit | Attending: Gastroenterology | Admitting: Gastroenterology

## 2024-03-18 DIAGNOSIS — R195 Other fecal abnormalities: Secondary | ICD-10-CM | POA: Diagnosis not present

## 2024-03-18 DIAGNOSIS — R1031 Right lower quadrant pain: Secondary | ICD-10-CM | POA: Insufficient documentation

## 2024-03-18 DIAGNOSIS — K449 Diaphragmatic hernia without obstruction or gangrene: Secondary | ICD-10-CM | POA: Diagnosis not present

## 2024-03-18 DIAGNOSIS — Z9049 Acquired absence of other specified parts of digestive tract: Secondary | ICD-10-CM | POA: Diagnosis not present

## 2024-03-23 ENCOUNTER — Ambulatory Visit: Admitting: Dermatology

## 2024-03-31 ENCOUNTER — Other Ambulatory Visit (HOSPITAL_BASED_OUTPATIENT_CLINIC_OR_DEPARTMENT_OTHER)

## 2024-03-31 ENCOUNTER — Telehealth: Payer: Self-pay

## 2024-03-31 DIAGNOSIS — G4733 Obstructive sleep apnea (adult) (pediatric): Secondary | ICD-10-CM

## 2024-03-31 NOTE — Telephone Encounter (Signed)
 Copied from CRM [id number]. Topic: Clinical - Order For Equipment >> Mar 31, 2024  3:20 PM Regina Nash wrote: Reason for CRM: pt states she does not want to do the mask fitting,  she canceled appt for mask fitting in [place]. Pt now needs new CPAP and wants to try a FULL FACE mask in [facility] Pt says the provider of her cpap picked it up already., that is why she needs a new cpap. Please call pt w/ this information

## 2024-03-31 NOTE — Telephone Encounter (Signed)
 I spoke with the patient. She said she had to turn her CPAP in because she was not using it. She wants to know if you will order another CPAP machine with a full face mask for her?  Okay to place the order?

## 2024-04-01 NOTE — Telephone Encounter (Signed)
 Order has placed and I have left a detailed message on the patient's secure voicemail.  Nothing further needed.

## 2024-04-02 ENCOUNTER — Ambulatory Visit: Admitting: Internal Medicine

## 2024-04-05 NOTE — Progress Notes (Unsigned)
 Cardiology Clinic Note   Date: 04/07/2024 ID: Regina Nash Jun 23, 1951, MRN [id number]  Primary Cardiologist:  Evalene Lunger, MD  Chief Complaint   Regina Nash is a 73 y.o. female who presents to the clinic today for evaluation of aortic atherosclerosis.   Patient Profile   Regina Nash is followed by Dr. Gollan for the history outlined below.      Past medical history significant for: Aortic atherosclerosis. Palpitations/paroxysmal tachycardia. 14-day ZIO 05/15/2018: HR 59 to 127 bpm, average 84 bpm.  1 run of SVT/A. tach lasting 5 beats max rate 118 bpm.  Rare ectopy. OSA. Hyperlipidemia. CT cardiac scoring 06/18/2018: Coronary calcium score of 83. Lipid panel 02/11/2024: LDL 140, HDL 43, TG 163, total 216. CKD stage III. COPD PTSD. Prediabetes. Tobacco abuse.  In summary, patient was first evaluated by Dr. Gollan on 05/14/2018 for palpitations.  She reported palpitations for years occurring in the afternoon into the evening.  Palpitations described as a flip-flop pain.  She wore a 14-day ZIO which demonstrated 1 run of SVT/A. tach.  Patient was last seen in the office by Cadence Furth, PA-C on 02/17/2024 for routine follow-up.  She reported continued occasional palpitations worsened with lack of sleep and certain foods but she had not needed propranolol  recently.  No medication changes were made.     History of Present Illness    Today, patient is here alone. She reports concern over finding of aortic atherosclerosis on recent CT. Patient denies lower extremity edema, orthopnea or PND. No chest pain, pressure, or tightness. No palpitations. Patient reports fatigue related to OSA. She is waiting for a new machine. She had to undergo repeat sleep study for insurance purposes. She reports dyspnea with minimal exertion. Walking from the car into the building caused her to become winded.  She reports a lot of stress over the last several years. Her husband suffered TBI in 2022  and now resides in SNF. She quit drinking alcohol 4 years ago. She also reduced her smoking from 1.5 packs to 2 cigarettes a day. She does not participate in regular exercise. She has been concerned to start an exercise regimen, as she is very concerned about her heart.  She is not interested in going on statins favoring diet control for cholesterol.     ROS: All other systems reviewed and are otherwise negative except as noted in History of Present Illness.  EKGs/Labs Reviewed       No EKG ordered today.   02/11/2024: ALT 10; AST 13; BUN 8; Creatinine, Ser 0.80; Potassium 3.9; Sodium 140   07/25/2023: Hemoglobin 13.7; WBC 6.8    Physical Exam    VS:  BP 126/78   Pulse 81   Ht 5' 4 (1.626 m)   Wt 172 lb (78 kg)   LMP 02/16/2010 (Exact Date)   SpO2 96%   BMI 29.52 kg/m  , BMI Body mass index is 29.52 kg/m.  GEN: Well nourished, well developed, in no acute distress. Neck: No JVD or carotid bruits. Cardiac:  RRR.  No murmur. No rubs or gallops.   Respiratory:  Respirations regular and unlabored. Clear to auscultation without rales, wheezing or rhonchi. GI: Soft, nontender, nondistended. Extremities: Radials/DP/PT 2+ and equal bilaterally. No clubbing or cyanosis. No edema.  Skin: Warm and dry, no rash. Neuro: Strength intact.  Assessment & Plan   Aortic atherosclerosis/hyperlipidemia CT cardiac scoring 83 December 2019.  LDL 140 July 2025.  Aortic atherosclerosis seen on CT September 2025.  Patient does  not want to start a statin. She has looked at a Mediterranean diet previously but is limited secondary to not eating a lot of meat. She gets her protein from beans. She does like sweets.  - Discussed reducing carbs and increasing fiber and protein.   DOE Patient reports progressive DOE. She gets easily winded with minimal exertion. No lower extremity edema, orthopnea or PND. Euvolemic and well compensated on exam.  - Schedule echo.   OSA/Fatigue Patient reports fatigue  related to OSA. She needs an updated sleep study for insurance. She is waiting for a new CPAP machine.  - Continue to follow with PCP.   Tobacco abuse Patient reports reducing smoking from 1.5 packs to cigarettes a day. She is congratulated for her effort.  - Will discuss smoking cessation at follow up.   Disposition: Echo. Return in 4 weeks or sooner as needed.          Signed, [name], DNP, NP-C

## 2024-04-06 ENCOUNTER — Telehealth: Payer: Self-pay | Admitting: Internal Medicine

## 2024-04-06 DIAGNOSIS — G4733 Obstructive sleep apnea (adult) (pediatric): Secondary | ICD-10-CM

## 2024-04-06 NOTE — Telephone Encounter (Signed)
 I received a message from [place] with [employer], [place]   Looks like this CPAP was returned per [facility] phone notes and our docs. Patient will need new sleep study and qualifying docs ( [id number] ) to process this request.

## 2024-04-07 ENCOUNTER — Ambulatory Visit: Attending: Student | Admitting: Student

## 2024-04-07 ENCOUNTER — Ambulatory Visit: Admitting: Dermatology

## 2024-04-07 ENCOUNTER — Encounter: Payer: Self-pay | Admitting: Student

## 2024-04-07 VITALS — BP 126/78 | HR 81 | Ht 64.0 in | Wt 172.0 lb

## 2024-04-07 DIAGNOSIS — E7800 Pure hypercholesterolemia, unspecified: Secondary | ICD-10-CM | POA: Diagnosis not present

## 2024-04-07 DIAGNOSIS — I700 Atherosclerosis of aorta: Secondary | ICD-10-CM

## 2024-04-07 DIAGNOSIS — G4733 Obstructive sleep apnea (adult) (pediatric): Secondary | ICD-10-CM | POA: Diagnosis not present

## 2024-04-07 DIAGNOSIS — Z720 Tobacco use: Secondary | ICD-10-CM

## 2024-04-07 DIAGNOSIS — R0609 Other forms of dyspnea: Secondary | ICD-10-CM

## 2024-04-07 DIAGNOSIS — R5383 Other fatigue: Secondary | ICD-10-CM

## 2024-04-07 NOTE — Patient Instructions (Addendum)
 Medication Instructions:  Your physician recommends that you continue on your current medications as directed. Please refer to the Current Medication list given to you today.   *If you need a refill on your cardiac medications before your next appointment, please call your pharmacy*  Lab Work: None ordered at this time  If you have labs (blood work) drawn today and your tests are completely normal, you will receive your results only by: MyChart Message (if you have MyChart) OR A paper copy in the mail If you have any lab test that is abnormal or we need to change your treatment, we will call you to review the results.  Testing/Procedures: Your physician has requested that you have an Echocardiogram. Echocardiography is a painless test that uses sound waves to create images of your heart. It provides your doctor with information about the size and shape of your heart and how well your heart's chambers and valves are working.   You may receive an ultrasound enhancing agent through an IV if needed to better visualize your heart during the echo. This procedure takes approximately one hour.  There are no restrictions for this procedure.  This will take place at [address] [facility]) [address], [place] [address]  Please note: We ask at that you not bring children with you during ultrasound (echo/ vascular) testing. Due to room size and safety concerns, children are not allowed in the ultrasound rooms during exams. Our front office staff cannot provide observation of children in our lobby area while testing is being conducted. An adult accompanying a patient to their appointment will only be allowed in the ultrasound room at the discretion of the ultrasound technician under special circumstances. We apologize for any inconvenience.   Follow-Up: At [facility], you and your health needs are our priority.  As part of our continuing mission to provide you with exceptional heart  care, our providers are all part of one team.  This team includes your primary Cardiologist (physician) and Advanced Practice Providers or APPs (Physician Assistants and Nurse Practitioners) who all work together to provide you with the care you need, when you need it.  Your next appointment:   3 -4 week(s) (one week after the ECHO)  Provider:   You may see Timothy Gollan, MD or one of the following Advanced Practice Providers on your designated Care Team:   Lonni Meager, NP Lesley Maffucci, PA-C Bernardino Bring, PA-C Cadence [place], PA-C Tylene Lunch, NP Barnie Hila, NP    We recommend signing up for the patient portal called MyChart.  Sign up information is provided on this After Visit Summary.  MyChart is used to connect with patients for Virtual Visits (Telemedicine).  Patients are able to view lab/test results, encounter notes, upcoming appointments, etc.  Non-urgent messages can be sent to your provider as well.   To learn more about what you can do with MyChart, go to [email or web address].

## 2024-04-07 NOTE — Telephone Encounter (Signed)
 I spoke with the patient. She was just seen on 03/05/2024. I told her we would order the HST and they will reach out to her. I told her we will reach out to her once we get the results back and discuss ordering another CPAP machine if needed.  HST has been ordered.  /Nothing further needed.

## 2024-04-16 ENCOUNTER — Ambulatory Visit: Admitting: Dermatology

## 2024-04-20 ENCOUNTER — Ambulatory Visit: Admitting: Dermatology

## 2024-04-20 ENCOUNTER — Encounter: Payer: Self-pay | Admitting: Dermatology

## 2024-04-20 DIAGNOSIS — L821 Other seborrheic keratosis: Secondary | ICD-10-CM | POA: Diagnosis not present

## 2024-04-20 DIAGNOSIS — D229 Melanocytic nevi, unspecified: Secondary | ICD-10-CM | POA: Diagnosis not present

## 2024-04-20 DIAGNOSIS — L918 Other hypertrophic disorders of the skin: Secondary | ICD-10-CM

## 2024-04-20 DIAGNOSIS — L739 Follicular disorder, unspecified: Secondary | ICD-10-CM

## 2024-04-20 DIAGNOSIS — L820 Inflamed seborrheic keratosis: Secondary | ICD-10-CM

## 2024-04-20 NOTE — Progress Notes (Signed)
   Follow-Up Visit   Subjective  Regina Nash is a 73 y.o. female who presents for the following: skin tags at neck, right breast. Also with a wart at right cheek and a few other spots she would like checked.   The following portions of the chart were reviewed this encounter and updated as appropriate: medications, allergies, medical history  Review of Systems:  No other skin or systemic complaints except as noted in HPI or Assessment and Plan.  Objective  Well appearing patient in no apparent distress; mood and affect are within normal limits.   A focused examination was performed of the following areas: Scalp, face, neck, breast, chest, arms  Relevant exam findings are noted in the Assessment and Plan.  R neck x 3, R breast x 4 (7) Fleshy, skin-colored pedunculated papules.   R neck x 4, R breast x 5 (9) Erythematous stuck-on, waxy papule or plaque  Assessment & Plan   FOLLICULITIS Exam: Perifollicular erythematous papules and pustules at scalp, posterior neck  Folliculitis occurs due to inflammation of the superficial hair follicle (pore), resulting in acne-like lesions (pus bumps). It can be infectious (bacterial, fungal) or noninfectious (shaving, tight clothing, heat/sweat, medications).  Folliculitis can be acute or chronic and recommended treatment depends on the underlying cause of folliculitis.  Treatment Plan: observe  SEBORRHEIC KERATOSIS - Stuck-on, waxy, tan-brown papules and/or plaques  - Benign-appearing - Discussed benign etiology and prognosis. - Observe - Call for any changes  MELANOCYTIC NEVI Exam: Tan-brown and/or pink-flesh-colored symmetric macules and papules  Treatment Plan: Benign appearing on exam today. Recommend observation. Call clinic for new or changing moles. Recommend daily use of broad spectrum spf 30+ sunscreen to sun-exposed areas.     SKIN TAG (7) R neck x 3, R breast x 4 (7) Symptomatic, irritating, patient would like  treated.  Benign-appearing.  Call clinic for new or changing lesions.   Destruction of lesion - R neck x 3, R breast x 4 (7) Complexity: simple   Destruction method: cryotherapy   Informed consent: discussed and consent obtained   Timeout:  patient name, date of birth, surgical site, and procedure verified Lesion destroyed using liquid nitrogen: Yes   Region frozen until ice ball extended beyond lesion: Yes   Cryo cycles: 1 or 2. Outcome: patient tolerated procedure well with no complications   Post-procedure details: wound care instructions given    INFLAMED SEBORRHEIC KERATOSIS (9) R neck x 4, R breast x 5 (9) Symptomatic, irritating, patient would like treated.  Benign-appearing.  Call clinic for new or changing lesions.   Destruction of lesion - R neck x 4, R breast x 5 (9) Complexity: simple   Destruction method: cryotherapy   Informed consent: discussed and consent obtained   Timeout:  patient name, date of birth, surgical site, and procedure verified Lesion destroyed using liquid nitrogen: Yes   Region frozen until ice ball extended beyond lesion: Yes   Cryo cycles: 1 or 2. Outcome: patient tolerated procedure well with no complications   Post-procedure details: wound care instructions given    SEBORRHEIC KERATOSES   FOLLICULITIS   MULTIPLE BENIGN NEVI    Return for TBSE, as scheduled, with Dr. Claudene.  LILLETTE Lonell Drones, RMA, am acting as scribe for Boneta Claudene, MD .   Documentation: I have reviewed the above documentation for accuracy and completeness, and I agree with the above.  Boneta Claudene, MD

## 2024-04-23 NOTE — Progress Notes (Signed)
 This encounter was created in error - please disregard.

## 2024-04-28 ENCOUNTER — Telehealth: Payer: Self-pay | Admitting: Internal Medicine

## 2024-04-28 NOTE — Telephone Encounter (Signed)
 Copied from CRM [id number]. Topic: General - Call Back - No Documentation >> Apr 28, 2024  1:37 PM Rea C wrote: Reason for CRM: Chyrl, Pharmacist, from [facility] calling to speak with a clinical team member to answer a few questions in regards to the patient.   Call back number is: [phone] ext [phone]  The member is flagging for a statin recommendation.  ICD-10 code: i25.10  And would like to know if provider would consider a statin for patient.

## 2024-05-01 NOTE — Telephone Encounter (Signed)
 noted

## 2024-05-07 ENCOUNTER — Encounter

## 2024-05-07 DIAGNOSIS — G4733 Obstructive sleep apnea (adult) (pediatric): Secondary | ICD-10-CM

## 2024-05-07 DIAGNOSIS — G4730 Sleep apnea, unspecified: Secondary | ICD-10-CM | POA: Diagnosis not present

## 2024-05-14 ENCOUNTER — Telehealth: Payer: Self-pay | Admitting: Cardiovascular Disease

## 2024-05-14 ENCOUNTER — Telehealth: Payer: Self-pay

## 2024-05-14 NOTE — Telephone Encounter (Signed)
 Calling about a diagnose code that was give to the patient on 02/17/24. Calling to discuss statin therapy with the patient base off he diagnose code I25.10. Please advise

## 2024-05-14 NOTE — Telephone Encounter (Signed)
 Discussed pt diagnosis code/s - concerned for pt needing to be on hyperlipidemia meds and none have been prescribed - read to rep notes where pt had declined pharmacotherapies  Rep request: Please readdress at next office visit

## 2024-05-14 NOTE — Telephone Encounter (Signed)
 Copied from CRM #[id number]. Topic: Clinical - Medical Advice >> May 14, 2024  1:15 PM Harlene ORN wrote: Reason for CRM: [place] - [employer]  They sent over a fax on 10/14 to verifiy the patient's diagnosis of cardiovascular disease. Calling to see if the PCP has received this information as well.  phone: [phone] ext. [phone]

## 2024-05-15 ENCOUNTER — Ambulatory Visit: Attending: Student

## 2024-05-15 DIAGNOSIS — R0609 Other forms of dyspnea: Secondary | ICD-10-CM

## 2024-05-15 LAB — ECHOCARDIOGRAM COMPLETE
AR max vel: 2.06 cm2
AV Area VTI: 2.07 cm2
AV Area mean vel: 2.02 cm2
AV Mean grad: 4 mmHg
AV Peak grad: 7.1 mmHg
Ao pk vel: 1.33 m/s
Area-P 1/2: 3.42 cm2
MV M vel: 1.41 m/s
MV Peak grad: 8 mmHg
S' Lateral: 2.1 cm

## 2024-05-15 NOTE — Telephone Encounter (Signed)
 This has been completed and faxed back.

## 2024-05-16 ENCOUNTER — Ambulatory Visit: Payer: Self-pay | Admitting: Student

## 2024-05-18 NOTE — Progress Notes (Signed)
 Last read by Glendale Duet Hope at 11:01AM on 05/18/2024.

## 2024-05-19 DIAGNOSIS — G4730 Sleep apnea, unspecified: Secondary | ICD-10-CM | POA: Diagnosis not present

## 2024-05-21 DIAGNOSIS — H524 Presbyopia: Secondary | ICD-10-CM | POA: Diagnosis not present

## 2024-05-22 NOTE — Progress Notes (Signed)
 Cardiology Clinic Note   Date: 06/01/2024 ID: Karma Ansley, DOB Feb 13, 1951, MRN [id number]  Primary Cardiologist:  Evalene Lunger, MD  Chief Complaint   Regina Nash is a 73 y.o. female who presents to the clinic today for follow up after testing.   Patient Profile   Sennie Borden is followed by Dr. Gollan for the history outlined below.       Past medical history significant for: Aortic atherosclerosis. Dyspnea. Echo 05/15/2024: EF 65 to 70%.  No RWMA.  Mild LVH.  Grade 1 DD.  Normal RV size/function.  Mild MR. Palpitations/paroxysmal tachycardia. 14-day ZIO 05/15/2018: HR 59 to 127 bpm, average 84 bpm.  1 run of SVT/A. tach lasting 5 beats max rate 118 bpm.  Rare ectopy. OSA. Hyperlipidemia. CT cardiac scoring 06/18/2018: Coronary calcium score of 83. Lipid panel 02/11/2024: LDL 140, HDL 43, TG 163, total 216. CKD stage III. COPD PTSD. Prediabetes. Tobacco abuse.  In summary, patient was first evaluated by Dr. Gollan on 05/14/2018 for palpitations.  She reported palpitations for years occurring in the afternoon into the evening.  Palpitations described as a flip-flop pain.  She wore a 14-day ZIO which demonstrated 1 run of SVT/A. tach.  Patient was seen in the clinic by Cadence Furth, PA-C on 02/17/2024 for routine follow-up.  She reported continued occasional palpitations worsened with lack of sleep and certain foods but she had not needed propranolol .  No medication changes were made.   Patient was last seen in the office by me on 04/07/2024 for evaluation of aortic atherosclerosis.  She reported dyspnea with minimal exertion.  Echo demonstrated normal LV/RV function.     History of Present Illness    Today, patient reports continued dyspnea. No chest pain, pressure or tightness. She does not participate in regular exercise. She does things around her home. She continues to smoke.  Discussed results of echo in detail. All questions answered.     ROS: All other systems  reviewed and are otherwise negative except as noted in History of Present Illness.  EKGs/Labs Reviewed       EKG is not performed today.   02/11/2024: ALT 10; AST 13; BUN 8; Creatinine, Ser 0.80; Potassium 3.9; Sodium 140   07/25/2023: Hemoglobin 13.7; WBC 6.8    Physical Exam    VS:  BP 120/70 (BP Location: Left Arm, Patient Position: Sitting, Cuff Size: Normal)   Pulse 80   Ht 5' 4.5 (1.638 m)   Wt 175 lb (79.4 kg)   LMP 02/16/2010 (Exact Date)   SpO2 98%   BMI 29.57 kg/m  , BMI Body mass index is 29.57 kg/m.  GEN: Well nourished, well developed, in no acute distress. Neck: No JVD or carotid bruits. Cardiac:  RRR.  No murmur. No rubs or gallops.   Respiratory:  Respirations regular and unlabored. Clear to auscultation without rales, wheezing or rhonchi. GI: Soft, nontender, nondistended. Extremities: Radials/DP/PT 2+ and equal bilaterally. No clubbing or cyanosis. No edema  Skin: Warm and dry, no rash. Neuro: Strength intact.  Assessment & Plan   Aortic atherosclerosis/hyperlipidemia CT cardiac scoring 83 December 2019.  LDL 140 July 2025.  Aortic atherosclerosis seen on CT September 2025.  Patient does not want to start a statin. She does not exercise regularly. Had a long discussion about incrementally increasing activity.  - Increase physical activity.  - Discussed reducing carbs and increasing fiber and protein.    DOE Echo October 2025 showed normal LV/RV function, mild LVH, Grade I DD, mild  MR.  Patient reports continued dyspnea. Discussed deconditioning and strategies to improve exercise tolerance. She is very motivated to change her health and start walking and doing chair exercise.  - Encouraged slowly increasing physical activity starting with a walking program.    OSA/Fatigue Patient reports fatigue related to OSA. Patient had a sleep study which showed mild OSA. It had previously been moderate. She is waiting for her CPAP machine. A dental device is not an  option for her secondary to dental issues.  - Encouraged use of CPAP when she gets her machine.  - Continue to follow with PCP.    Tobacco abuse Patient reports reducing smoking from 1.5 packs to 2.5 cigarettes a day. She is congratulated for her effort. - Encouraged slowly cutting out final cigarettes 1/2 a cigarette at a time.   Disposition: Return in 6 months or sooner as needed.          Signed, Barnie HERO. Justinn Welter, DNP, NP-C

## 2024-05-25 ENCOUNTER — Ambulatory Visit: Payer: Self-pay

## 2024-05-25 DIAGNOSIS — G4733 Obstructive sleep apnea (adult) (pediatric): Secondary | ICD-10-CM

## 2024-06-01 ENCOUNTER — Encounter: Payer: Self-pay | Admitting: Student

## 2024-06-01 ENCOUNTER — Ambulatory Visit: Attending: Student | Admitting: Student

## 2024-06-01 VITALS — BP 120/70 | HR 80 | Ht 64.5 in | Wt 175.0 lb

## 2024-06-01 DIAGNOSIS — I700 Atherosclerosis of aorta: Secondary | ICD-10-CM

## 2024-06-01 DIAGNOSIS — Z720 Tobacco use: Secondary | ICD-10-CM

## 2024-06-01 DIAGNOSIS — R5383 Other fatigue: Secondary | ICD-10-CM

## 2024-06-01 DIAGNOSIS — E7800 Pure hypercholesterolemia, unspecified: Secondary | ICD-10-CM | POA: Diagnosis not present

## 2024-06-01 DIAGNOSIS — R0609 Other forms of dyspnea: Secondary | ICD-10-CM | POA: Diagnosis not present

## 2024-06-01 DIAGNOSIS — G4733 Obstructive sleep apnea (adult) (pediatric): Secondary | ICD-10-CM | POA: Diagnosis not present

## 2024-06-01 NOTE — Patient Instructions (Signed)
 Medication Instructions:   Your physician recommends that you continue on your current medications as directed. Please refer to the Current Medication list given to you today.   *If you need a refill on your cardiac medications before your next appointment, please call your pharmacy*  Lab Work: No labs ordered today  If you have labs (blood work) drawn today and your tests are completely normal, you will receive your results only by: MyChart Message (if you have MyChart) OR A paper copy in the mail If you have any lab test that is abnormal or we need to change your treatment, we will call you to review the results.  Testing/Procedures: No test ordered today   Follow-Up: At [facility], you and your health needs are our priority.  As part of our continuing mission to provide you with exceptional heart care, our providers are all part of one team.  This team includes your primary Cardiologist (physician) and Advanced Practice Providers or APPs (Physician Assistants and Nurse Practitioners) who all work together to provide you with the care you need, when you need it.  Your next appointment:   6 month(s)  Provider:   You may see Timothy Gollan, MD or one of the following Advanced Practice Providers on your designated Care Team:    Barnie Hila, NP   We recommend signing up for the patient portal called MyChart.  Sign up information is provided on this After Visit Summary.  MyChart is used to connect with patients for Virtual Visits (Telemedicine).  Patients are able to view lab/test results, encounter notes, upcoming appointments, etc.  Non-urgent messages can be sent to your provider as well.   To learn more about what you can do with MyChart, go to [email or web address].

## 2024-06-09 ENCOUNTER — Ambulatory Visit: Admitting: Clinical

## 2024-06-09 DIAGNOSIS — F4310 Post-traumatic stress disorder, unspecified: Secondary | ICD-10-CM | POA: Diagnosis not present

## 2024-06-09 DIAGNOSIS — F4321 Adjustment disorder with depressed mood: Secondary | ICD-10-CM | POA: Diagnosis not present
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
## 2024-06-09 NOTE — Progress Notes (Signed)
   Darice Seats, LCSW

## 2024-06-10 ENCOUNTER — Ambulatory Visit

## 2024-06-10 VITALS — Ht 64.5 in | Wt 175.0 lb

## 2024-06-10 DIAGNOSIS — Z0000 Encounter for general adult medical examination without abnormal findings: Secondary | ICD-10-CM | POA: Diagnosis not present

## 2024-06-10 NOTE — Patient Instructions (Signed)
 Ms. Gubler,  Thank you for taking the time for your Medicare Wellness Visit. I appreciate your continued commitment to your health goals. Please review the care plan we discussed, and feel free to reach out if I can assist you further.  Please note that Annual Wellness Visits do not include a physical exam. Some assessments may be limited, especially if the visit was conducted virtually. If needed, we may recommend an in-person follow-up with your provider.  Ongoing Care Seeing your primary care provider every 3 to 6 months helps us  monitor your health and provide consistent, personalized care.   Referrals If a referral was made during today's visit and you haven't received any updates within two weeks, please contact the referred provider directly to check on the status.  Recommended Screenings:  Health Maintenance  Topic Date Due   DTaP/Tdap/Td vaccine (1 - Tdap) Never done   Medicare Annual Wellness Visit  02/04/2024   Flu Shot  Never done   Breast Cancer Screening  12/24/2024   Cologuard (Stool DNA test)  11/21/2026   Osteoporosis screening with Bone Density Scan  Completed   Hepatitis C Screening  Completed   Meningitis B Vaccine  Aged Out   Pneumococcal Vaccine for age over 2  Discontinued   COVID-19 Vaccine  Discontinued   Zoster (Shingles) Vaccine  Discontinued       06/10/2024    3:03 PM  Advanced Directives  Does Patient Have a Medical Advance Directive? No  Would patient like information on creating a medical advance directive? No - Patient declined    Vision: Annual vision screenings are recommended for early detection of glaucoma, cataracts, and diabetic retinopathy. These exams can also reveal signs of chronic conditions such as diabetes and high blood pressure.  Dental: Annual dental screenings help detect early signs of oral cancer, gum disease, and other conditions linked to overall health, including heart disease and diabetes.  Please see the attached  documents for additional preventive care recommendations.

## 2024-06-10 NOTE — Progress Notes (Signed)
 Chief Complaint  Patient presents with   Medicare Wellness     Subjective:   Regina Nash is a 73 y.o. female who presents for a Medicare Annual Wellness Visit.  Allergies (verified) Codeine, Erythromycin, and Penicillins   History: Past Medical History:  Diagnosis Date   Allergy    Anemia As a child   Anxiety    Arrhythmia    Arthritis    Asthma    Cataract    Cholecystitis 06/15/2023   Chronic bronchitis (HCC)    Chronic kidney disease    Depression    Emphysema of lung (HCC)    Fatty liver    Gallstones    Genital warts    GERD (gastroesophageal reflux disease)    Heart murmur 1968   Diagnosed 2005   History of recurrent UTIs    Hyperglycemia    IBS (irritable bowel syndrome)    Migraine    PTSD (post-traumatic stress disorder)    Sleep apnea    Substance abuse (HCC)    Urine incontinence    Past Surgical History:  Procedure Laterality Date   CHOLECYSTECTOMY  June 16, 2023   TUBAL LIGATION     Family History  Problem Relation Age of Onset   Alcoholism Other        Parent   Arthritis Other        Parent   Cancer Other        Breast, ovarian/uterine   Breast cancer Paternal Aunt        66's   Cancer Paternal Aunt    Alcohol abuse Mother    Anxiety disorder Mother    Depression Mother    Alcohol abuse Father    Anxiety disorder Sister    Anxiety disorder Sister    Depression Sister    Rheum arthritis Maternal Grandfather    Hearing loss Maternal Grandfather    Hearing loss Maternal Grandmother    Arthritis Paternal Grandfather    Vision loss Paternal Grandmother    Anxiety disorder Sister    Arthritis Sister    Depression Sister    Anxiety disorder Sister    Arthritis Sister    Depression Sister    Anxiety disorder Daughter    Depression Daughter    Colon cancer Neg Hx    Social History   Occupational History    Comment: full time  Tobacco Use   Smoking status: Every Day    Current packs/day: 0.25    Average packs/day: 0.3  packs/day for 24.7 years (6.4 ttl pk-yrs)    Types: Cigarettes   Smokeless tobacco: Never   Tobacco comments:    I used to smoke 1 1/2 packs of cigarettes a day. Now I smoke 2 1/2 cigarettes per day  Vaping Use   Vaping status: Never Used  Substance and Sexual Activity   Alcohol use: Not Currently    Comment: Stopped drinking all alcohol in August of 2021   Drug use: Never   Sexual activity: Not Currently    Birth control/protection: Surgical   Tobacco Counseling Ready to quit: Yes Counseling given: Not Answered Tobacco comments: I used to smoke 1 1/2 packs of cigarettes a day. Now I smoke 2 1/2 cigarettes per day  SDOH Screenings   Food Insecurity: No Food Insecurity (06/10/2024)  Housing: Low Risk  (06/10/2024)  Transportation Needs: Unmet Transportation Needs (06/10/2024)  Utilities: Not At Risk (06/10/2024)  Alcohol Screen: Low Risk  (02/04/2023)  Depression (PHQ2-9): Low Risk  (06/10/2024)  Financial Resource Strain: Low Risk  (06/09/2024)  Physical Activity: Insufficiently Active (06/10/2024)  Social Connections: Moderately Isolated (06/10/2024)  Stress: No Stress Concern Present (06/10/2024)  Tobacco Use: High Risk (06/10/2024)  Health Literacy: Adequate Health Literacy (06/10/2024)   See flowsheets for full screening details  Depression Screen PHQ 2 & 9 Depression Scale- Over the past 2 weeks, how often have you been bothered by any of the following problems? Little interest or pleasure in doing things: 0 Feeling down, depressed, or hopeless (PHQ Adolescent also includes...irritable): 1 PHQ-2 Total Score: 1 Trouble falling or staying asleep, or sleeping too much: 2 Feeling tired or having little energy: 2 Poor appetite or overeating (PHQ Adolescent also includes...weight loss): 0 Feeling bad about yourself - or that you are a failure or have let yourself or your family down: 0 Trouble concentrating on things, such as reading the newspaper or watching television  (PHQ Adolescent also includes...like school work): 0 Moving or speaking so slowly that other people could have noticed. Or the opposite - being so fidgety or restless that you have been moving around a lot more than usual: 0 Thoughts that you would be better off dead, or of hurting yourself in some way: 0 PHQ-9 Total Score: 5 If you checked off any problems, how difficult have these problems made it for you to do your work, take care of things at home, or get along with other people?: Somewhat difficult     Goals Addressed             This Visit's Progress    Quiting smoking- went from pack and a half  to 2 cigarettes a day.Work on weight loss. 20lb goal.         Visit info / Clinical Intake: Medicare Wellness Visit Type:: Subsequent Annual Wellness Visit Persons participating in visit:: patient Medicare Wellness Visit Mode:: Telephone If telephone:: video declined Because this visit was a virtual/telehealth visit:: pt reported vitals If Telephone or Video please confirm:: I connected with the patient using audio enabled telemedicine application and verified that I am speaking with the correct person using two identifiers; I discussed the limitations of evaluation and management by telemedicine; The patient expressed understanding and agreed to proceed Patient Location:: home Provider Location:: home Information given by:: patient Interpreter Needed?: No Pre-visit prep was completed: yes AWV questionnaire completed by patient prior to visit?: no Living arrangements:: with family/others Patient's Overall Health Status Rating: good Typical amount of pain: some (arthritis) Does pain affect daily life?: (!) yes Are you currently prescribed opioids?: no  Dietary Habits and Nutritional Risks How many meals a day?: 3 Eats fruit and vegetables daily?: yes (no fruit) Most meals are obtained by: eating out; preparing own meals In the last 2 weeks, have you had any of the following?:  none Diabetic:: no  Functional Status Activities of Daily Living (to include ambulation/medication): Independent Ambulation: Independent Medication Administration: Independent Home Management: Independent Manage your own finances?: yes Primary transportation is: driving Concerns about vision?: (!) yes (cataracts-will be having surgery) Concerns about hearing?: no  Fall Screening Falls in the past year?: 0 Number of falls in past year: 0 Was there an injury with Fall?: 0 Fall Risk Category Calculator: 0 Patient Fall Risk Level: Low Fall Risk  Fall Risk Patient at Risk for Falls Due to: No Fall Risks Fall risk Follow up: Falls evaluation completed; Education provided; Falls prevention discussed  Home and Transportation Safety: All rugs have non-skid backing?: yes All stairs or steps  have railings?: N/A, no stairs Grab bars in the bathtub or shower?: (!) no Have non-skid surface in bathtub or shower?: yes Good home lighting?: (!) no (PAtietn uses lamps all the time due to poor lighting in home.) Regular seat belt use?: yes Hospital stays in the last year:: (!) yes How many hospital stays:: 1 Reason: gallbladder surgery  Cognitive Assessment Difficulty concentrating, remembering, or making decisions? : yes Will 6CIT or Mini Cog be Completed: yes What year is it?: 0 points What month is it?: 0 points About what time is it?: 0 points Count backwards from 20 to 1: 0 points Say the months of the year in reverse: 0 points Repeat the address phrase from earlier: 0 points 6 CIT Score: 0 points  Advance Directives (For Healthcare) Does Patient Have a Medical Advance Directive?: No Would patient like information on creating a medical advance directive?: No - Patient declined  Reviewed/Updated  Reviewed/Updated: Reviewed All (Medical, Surgical, Family, Medications, Allergies, Care Teams, Patient Goals)        Objective:    Today's Vitals   06/10/24 1501  Weight: 175 lb  (79.4 kg)  Height: 5' 4.5 (1.638 m)   Body mass index is 29.57 kg/m.  Current Medications (verified) Outpatient Encounter Medications as of 06/10/2024  Medication Sig   Cyanocobalamin  (VITAMIN B12 PO) Take 1 tablet by mouth daily.   ergocalciferol  (DRISDOL ) 1.25 MG (50000 UT) capsule Take 1 capsule (50,000 Units total) by mouth once a week.   omeprazole (PRILOSEC) 20 MG capsule Take 20 mg by mouth daily.   ibuprofen  (ADVIL ) 800 MG tablet Take 1 tablet (800 mg total) by mouth every 8 (eight) hours as needed. (Patient not taking: Reported on 06/01/2024)   propranolol  (INDERAL ) 10 MG tablet Take 1 tablet (10 mg total) by mouth 3 (three) times daily as needed. (Patient not taking: Reported on 06/01/2024)   No facility-administered encounter medications on file as of 06/10/2024.   Hearing/Vision screen No results found. Immunizations and Health Maintenance Health Maintenance  Topic Date Due   Medicare Annual Wellness (AWV)  02/04/2024   Influenza Vaccine  10/13/2024 (Originally 02/14/2024)   DTaP/Tdap/Td (1 - Tdap) 06/10/2025 (Originally 01/30/1970)   Mammogram  12/24/2024   Fecal DNA (Cologuard)  11/21/2026   Bone Density Scan  Completed   Hepatitis C Screening  Completed   Meningococcal B Vaccine  Aged Out   Pneumococcal Vaccine: 50+ Years  Discontinued   COVID-19 Vaccine  Discontinued   Zoster Vaccines- Shingrix  Discontinued        Assessment/Plan:  This is a routine wellness examination for Regina Nash.  Patient Care Team: Marylynn Verneita CROME, MD as PCP - General (Internal Medicine) Perla Evalene PARAS, MD as PCP - Cardiology (Cardiology)  I have personally reviewed and noted the following in the patient's chart:   Medical and social history Use of alcohol, tobacco or illicit drugs  Current medications and supplements including opioid prescriptions. Functional ability and status Nutritional status Physical activity Advanced directives List of other physicians Hospitalizations,  surgeries, and ER visits in previous 12 months Vitals Screenings to include cognitive, depression, and falls Referrals and appointments  No orders of the defined types were placed in this encounter.  In addition, I have reviewed and discussed with patient certain preventive protocols, quality metrics, and best practice recommendations. A written personalized care plan for preventive services as well as general preventive health recommendations were provided to patient.   Arnette LOISE Hoots, CMA   06/10/2024   No  follow-ups on file.  After Visit Summary: (Declined) Due to this being a telephonic visit, with patients personalized plan was offered to patient but patient Declined AVS at this time   Nurse Notes: Patient states that she is not getting flu shots or tdaps. She has an appt in January to discuss denial of life insurance and to discuss advanced health care directives.

## 2024-06-15 ENCOUNTER — Ambulatory Visit: Admitting: Dermatology

## 2024-06-15 ENCOUNTER — Encounter: Payer: Self-pay | Admitting: Dermatology

## 2024-06-15 DIAGNOSIS — L820 Inflamed seborrheic keratosis: Secondary | ICD-10-CM | POA: Diagnosis not present

## 2024-06-15 DIAGNOSIS — D225 Melanocytic nevi of trunk: Secondary | ICD-10-CM | POA: Diagnosis not present

## 2024-06-15 DIAGNOSIS — D229 Melanocytic nevi, unspecified: Secondary | ICD-10-CM

## 2024-06-15 DIAGNOSIS — L821 Other seborrheic keratosis: Secondary | ICD-10-CM

## 2024-06-15 NOTE — Progress Notes (Signed)
 Chief Complaint  Patient presents with   Medicare Wellness   Vital Signs: Because this visit was a virtual/telehealth visit, some criteria may be missing or patient reported. Any vitals not documented were not able to be obtained and vitals that have been documented are patient reported.   Subjective:   Regina Nash is a 73 y.o. female who presents for a Medicare Annual Wellness Visit.  Allergies (verified) Codeine, Erythromycin, and Penicillins   History: Past Medical History:  Diagnosis Date   Allergy    Anemia As a child   Anxiety    Arrhythmia    Arthritis    Asthma    Cataract    Cholecystitis 06/15/2023   Chronic bronchitis (HCC)    Chronic kidney disease    Depression    Emphysema of lung (HCC)    Fatty liver    Gallstones    Genital warts    GERD (gastroesophageal reflux disease)    Heart murmur 1968   Diagnosed 2005   History of recurrent UTIs    Hyperglycemia    IBS (irritable bowel syndrome)    Migraine    PTSD (post-traumatic stress disorder)    Sleep apnea    Substance abuse (HCC)    Urine incontinence    Past Surgical History:  Procedure Laterality Date   CHOLECYSTECTOMY  June 16, 2023   TUBAL LIGATION     Family History  Problem Relation Age of Onset   Alcoholism Other        Parent   Arthritis Other        Parent   Cancer Other        Breast, ovarian/uterine   Breast cancer Paternal Aunt        69's   Cancer Paternal Aunt    Alcohol abuse Mother    Anxiety disorder Mother    Depression Mother    Alcohol abuse Father    Anxiety disorder Sister    Anxiety disorder Sister    Depression Sister    Rheum arthritis Maternal Grandfather    Hearing loss Maternal Grandfather    Hearing loss Maternal Grandmother    Arthritis Paternal Grandfather    Vision loss Paternal Grandmother    Anxiety disorder Sister    Arthritis Sister    Depression Sister    Anxiety disorder Sister    Arthritis Sister    Depression Sister    Anxiety  disorder Daughter    Depression Daughter    Colon cancer Neg Hx    Social History   Occupational History    Comment: full time  Tobacco Use   Smoking status: Every Day    Current packs/day: 0.25    Average packs/day: 0.3 packs/day for 24.7 years (6.4 ttl pk-yrs)    Types: Cigarettes   Smokeless tobacco: Never   Tobacco comments:    I used to smoke 1 1/2 packs of cigarettes a day. Now I smoke 2 1/2 cigarettes per day  Vaping Use   Vaping status: Never Used  Substance and Sexual Activity   Alcohol use: Not Currently    Comment: Stopped drinking all alcohol in August of 2021   Drug use: Never   Sexual activity: Not Currently    Birth control/protection: Surgical   Tobacco Counseling Ready to quit: Yes Counseling given: Not Answered Tobacco comments: I used to smoke 1 1/2 packs of cigarettes a day. Now I smoke 2 1/2 cigarettes per day  SDOH Screenings   Food Insecurity: No Food  Insecurity (06/10/2024)  Housing: Low Risk  (06/10/2024)  Transportation Needs: Unmet Transportation Needs (06/10/2024)  Utilities: Not At Risk (06/10/2024)  Alcohol Screen: Low Risk  (02/04/2023)  Depression (PHQ2-9): Low Risk  (06/10/2024)  Financial Resource Strain: Low Risk  (06/09/2024)  Physical Activity: Insufficiently Active (06/10/2024)  Social Connections: Moderately Isolated (06/10/2024)  Stress: No Stress Concern Present (06/10/2024)  Tobacco Use: High Risk (06/10/2024)  Health Literacy: Adequate Health Literacy (06/10/2024)   See flowsheets for full screening details  Depression Screen PHQ 2 & 9 Depression Scale- Over the past 2 weeks, how often have you been bothered by any of the following problems? Little interest or pleasure in doing things: 0 Feeling down, depressed, or hopeless (PHQ Adolescent also includes...irritable): 1 PHQ-2 Total Score: 1 Trouble falling or staying asleep, or sleeping too much: 2 Feeling tired or having little energy: 2 Poor appetite or overeating (PHQ  Adolescent also includes...weight loss): 0 Feeling bad about yourself - or that you are a failure or have let yourself or your family down: 0 Trouble concentrating on things, such as reading the newspaper or watching television (PHQ Adolescent also includes...like school work): 0 Moving or speaking so slowly that other people could have noticed. Or the opposite - being so fidgety or restless that you have been moving around a lot more than usual: 0 Thoughts that you would be better off dead, or of hurting yourself in some way: 0 PHQ-9 Total Score: 5 If you checked off any problems, how difficult have these problems made it for you to do your work, take care of things at home, or get along with other people?: Somewhat difficult     Goals Addressed             This Visit's Progress    Quiting smoking- went from pack and a half  to 2 cigarettes a day.Work on weight loss. 20lb goal.         Visit info / Clinical Intake: Medicare Wellness Visit Type:: Subsequent Annual Wellness Visit Persons participating in visit:: patient Medicare Wellness Visit Mode:: Telephone If telephone:: video declined Because this visit was a virtual/telehealth visit:: pt reported vitals If Telephone or Video please confirm:: I connected with the patient using audio enabled telemedicine application and verified that I am speaking with the correct person using two identifiers; I discussed the limitations of evaluation and management by telemedicine; The patient expressed understanding and agreed to proceed Patient Location:: home Provider Location:: home Information given by:: patient Interpreter Needed?: No Pre-visit prep was completed: yes AWV questionnaire completed by patient prior to visit?: no Living arrangements:: with family/others Patient's Overall Health Status Rating: good Typical amount of pain: some (arthritis) Does pain affect daily life?: (!) yes Are you currently prescribed opioids?:  no  Dietary Habits and Nutritional Risks How many meals a day?: 3 Eats fruit and vegetables daily?: yes (no fruit) Most meals are obtained by: eating out; preparing own meals In the last 2 weeks, have you had any of the following?: none Diabetic:: no  Functional Status Activities of Daily Living (to include ambulation/medication): Independent Ambulation: Independent Medication Administration: Independent Home Management: Independent Manage your own finances?: yes Primary transportation is: driving Concerns about vision?: (!) yes (cataracts-will be having surgery) Concerns about hearing?: no  Fall Screening Falls in the past year?: 0 Number of falls in past year: 0 Was there an injury with Fall?: 0 Fall Risk Category Calculator: 0 Patient Fall Risk Level: Low Fall Risk  Fall Risk Patient  at Risk for Falls Due to: No Fall Risks Fall risk Follow up: Falls evaluation completed; Education provided; Falls prevention discussed  Home and Transportation Safety: All rugs have non-skid backing?: yes All stairs or steps have railings?: N/A, no stairs Grab bars in the bathtub or shower?: (!) no Have non-skid surface in bathtub or shower?: yes Good home lighting?: (!) no (PAtietn uses lamps all the time due to poor lighting in home.) Regular seat belt use?: yes Hospital stays in the last year:: (!) yes How many hospital stays:: 1 Reason: gallbladder surgery  Cognitive Assessment Difficulty concentrating, remembering, or making decisions? : yes Will 6CIT or Mini Cog be Completed: yes What year is it?: 0 points What month is it?: 0 points About what time is it?: 0 points Count backwards from 20 to 1: 0 points Say the months of the year in reverse: 0 points Repeat the address phrase from earlier: 0 points 6 CIT Score: 0 points  Advance Directives (For Healthcare) Does Patient Have a Medical Advance Directive?: No Would patient like information on creating a medical advance  directive?: No - Patient declined  Reviewed/Updated  Reviewed/Updated: Reviewed All (Medical, Surgical, Family, Medications, Allergies, Care Teams, Patient Goals)        Objective:    Today's Vitals   06/10/24 1501  Weight: 175 lb (79.4 kg)  Height: 5' 4.5 (1.638 m)   Body mass index is 29.57 kg/m.  Current Medications (verified) Outpatient Encounter Medications as of 06/10/2024  Medication Sig   Cyanocobalamin  (VITAMIN B12 PO) Take 1 tablet by mouth daily.   ergocalciferol  (DRISDOL ) 1.25 MG (50000 UT) capsule Take 1 capsule (50,000 Units total) by mouth once a week.   omeprazole (PRILOSEC) 20 MG capsule Take 20 mg by mouth daily.   ibuprofen  (ADVIL ) 800 MG tablet Take 1 tablet (800 mg total) by mouth every 8 (eight) hours as needed. (Patient not taking: Reported on 06/01/2024)   propranolol  (INDERAL ) 10 MG tablet Take 1 tablet (10 mg total) by mouth 3 (three) times daily as needed. (Patient not taking: Reported on 06/01/2024)   No facility-administered encounter medications on file as of 06/10/2024.   Hearing/Vision screen No results found. Immunizations and Health Maintenance Health Maintenance  Topic Date Due   Influenza Vaccine  10/13/2024 (Originally 02/14/2024)   DTaP/Tdap/Td (1 - Tdap) 06/10/2025 (Originally 01/30/1970)   Mammogram  12/24/2024   Medicare Annual Wellness (AWV)  06/10/2025   Fecal DNA (Cologuard)  11/21/2026   Bone Density Scan  Completed   Hepatitis C Screening  Completed   Meningococcal B Vaccine  Aged Out   Pneumococcal Vaccine: 50+ Years  Discontinued   COVID-19 Vaccine  Discontinued   Zoster Vaccines- Shingrix  Discontinued        Assessment/Plan:  This is a routine wellness examination for Regina Nash.  Patient Care Team: Marylynn Verneita CROME, MD as PCP - General (Internal Medicine) Perla Evalene PARAS, MD as PCP - Cardiology (Cardiology)  I have personally reviewed and noted the following in the patient's chart:   Medical and social history Use  of alcohol, tobacco or illicit drugs  Current medications and supplements including opioid prescriptions. Functional ability and status Nutritional status Physical activity Advanced directives List of other physicians Hospitalizations, surgeries, and ER visits in previous 12 months Vitals Screenings to include cognitive, depression, and falls Referrals and appointments  No orders of the defined types were placed in this encounter.  In addition, I have reviewed and discussed with patient certain preventive protocols, quality  metrics, and best practice recommendations. A written personalized care plan for preventive services as well as general preventive health recommendations were provided to patient.   Arnette LOISE Hoots, CMA   06/15/2024   Return in 1 year (on 06/10/2025).  After Visit Summary: (Declined) Due to this being a telephonic visit, with patients personalized plan was offered to patient but patient Declined AVS at this time   Nurse Notes: Patient states that she is not getting flu shots or tdaps. She has an appt in January to discuss denial of life insurance and to discuss advanced health care directives.

## 2024-06-15 NOTE — Patient Instructions (Signed)

## 2024-06-15 NOTE — Progress Notes (Signed)
   Follow-Up Visit   Subjective  Regina Nash is a 73 y.o. female who presents for the following: spots at both legs beside each knee that are itchy, discolored and painful. Also a spot at left upper thigh. Similar spot that is very itchy at right arm.   Patient advises there is a spot under right breast that she was told at last visit we could remove if becomes bothersome, patient would like to do that today.   The following portions of the chart were reviewed this encounter and updated as appropriate: medications, allergies, medical history  Review of Systems:  No other skin or systemic complaints except as noted in HPI or Assessment and Plan.  Objective  Well appearing patient in no apparent distress; mood and affect are within normal limits.  A focused examination was performed of the following areas: Legs, arms  Relevant exam findings are noted in the Assessment and Plan.  R upper medial arm x 1, R medial knee x 5, L distal medial thigh x 1 (7) Erythematous stuck-on, waxy papule or plaque  Assessment & Plan   MELANOCYTIC NEVI Exam: Tan-brown and/or pink-flesh-colored symmetric macules and papules at right inframammary  Treatment Plan: Benign appearing on exam today. Recommend observation. Call clinic for new or changing moles. Recommend daily use of broad spectrum spf 30+ sunscreen to sun-exposed areas.   SEBORRHEIC KERATOSIS - Stuck-on, waxy, tan-brown papules and/or plaques  - Benign-appearing - Discussed benign etiology and prognosis. - Observe - Call for any changes  INFLAMED SEBORRHEIC KERATOSIS (7) R upper medial arm x 1, R medial knee x 5, L distal medial thigh x 1 (7) Symptomatic, irritating, patient would like treated.  Benign-appearing.  Call clinic for new or changing lesions.   Destruction of lesion - R upper medial arm x 1, R medial knee x 5, L distal medial thigh x 1 (7) Complexity: simple   Destruction method: cryotherapy   Informed consent: discussed  and consent obtained   Timeout:  patient name, date of birth, surgical site, and procedure verified Lesion destroyed using liquid nitrogen: Yes   Region frozen until ice ball extended beyond lesion: Yes   Cryo cycles: 1 or 2. Outcome: patient tolerated procedure well with no complications   Post-procedure details: wound care instructions given    MULTIPLE BENIGN NEVI    Return for TBSE, as scheduled, with Dr. Claudene.  LILLETTE Lonell Drones, RMA, am acting as scribe for Boneta Claudene, MD .   Documentation: I have reviewed the above documentation for accuracy and completeness, and I agree with the above.  Boneta Claudene, MD

## 2024-06-17 ENCOUNTER — Encounter: Payer: Self-pay | Admitting: Dermatology

## 2024-06-17 ENCOUNTER — Ambulatory Visit: Admitting: Dermatology

## 2024-06-17 ENCOUNTER — Encounter: Payer: Self-pay | Admitting: Internal Medicine

## 2024-06-17 ENCOUNTER — Ambulatory Visit: Admitting: Internal Medicine

## 2024-06-17 VITALS — BP 118/78 | HR 83 | Temp 97.9°F | Ht 64.5 in | Wt 176.2 lb

## 2024-06-17 DIAGNOSIS — Z48817 Encounter for surgical aftercare following surgery on the skin and subcutaneous tissue: Secondary | ICD-10-CM

## 2024-06-17 DIAGNOSIS — R1031 Right lower quadrant pain: Secondary | ICD-10-CM | POA: Insufficient documentation

## 2024-06-17 DIAGNOSIS — Z5189 Encounter for other specified aftercare: Secondary | ICD-10-CM

## 2024-06-17 MED ORDER — METHOCARBAMOL 500 MG PO TABS: 500.0000 mg | ORAL_TABLET | Freq: Three times a day (TID) | ORAL | 0 refills | Status: AC | PRN

## 2024-06-17 NOTE — Assessment & Plan Note (Signed)
-   Patient with significant right lower extremity pain over the last week -Pain is in the right groin crease and traveling down her right inner thigh -Worsened by activity like walking -Patient does lift heavy groceries and is concerned that this may have caused her issue -On exam, patient has mild tenderness to palpation in the right groin please with mild tenderness to palpation in her right inner thigh.  No rash or erythema noted -I suspect patient likely has a pulled groin muscle -Refer patient to PT for further evaluation and management.  Continue with Tylenol  and topical muscle rub for pain.  I have prescribed Robaxin for use for severe pain -No further workup at this time

## 2024-06-17 NOTE — Progress Notes (Signed)
   Acute Office Visit  Subjective:     Patient ID: Regina Nash, female    DOB: Jun 11, 1951, 73 y.o.   MRN: [id number]  Chief Complaint  Patient presents with   Acute Visit    Right groin & right leg pain x 1 week Pain 6/10 when started  Tender to the touch    Discussed the use of AI scribe software for clinical note transcription with the patient, who gave verbal consent to proceed.  History of Present Illness Regina Nash is a 73 year old female who presents with groin and right leg pain.  Groin and right leg pain - Pain and tenderness present in the right groin crease extending down to the inner thigh for at least one week - Pain unchanged in intensity over the past week - Exacerbated by activity such as walking and sitting - Becomes more tender as the day progresses - No recollection of specific inciting trauma, but recent heavy lifting while shopping may have contributed - No redness, heat, or rash in the affected area - No leg swelling  Constitutional and infectious symptoms - No fever or chills - No cough or cold symptoms  Dermatologic concerns - History of actinic keratosis with recent cryotherapy to lesions by dermatologist - Concerned about the appearance of treated areas - No association between dermatologic treatment and current pain  Functional status and exercise - Prefers walking as a form of exercise - Feels vulnerable in public spaces due to fear of dogs    ROS      Objective:    BP 118/78   Pulse 83   Temp 97.9 F (36.6 C)   Ht 5' 4.5 (1.638 m)   Wt 176 lb 3.2 oz (79.9 kg)   LMP 02/16/2010 (Exact Date)   SpO2 98%   BMI 29.78 kg/m    Physical Exam  No results found for any visits on 06/17/24.      Assessment & Plan:   Problem List Items Addressed This Visit       Other   Right groin pain - Primary   - Patient with significant right lower extremity pain over the last week -Pain is in the right groin crease and traveling  down her right inner thigh -Worsened by activity like walking -Patient does lift heavy groceries and is concerned that this may have caused her issue -On exam, patient has mild tenderness to palpation in the right groin please with mild tenderness to palpation in her right inner thigh.  No rash or erythema noted -I suspect patient likely has a pulled groin muscle -Refer patient to PT for further evaluation and management.  Continue with Tylenol  and topical muscle rub for pain.  I have prescribed Robaxin  for use for severe pain -No further workup at this time      Relevant Medications   methocarbamol  (ROBAXIN ) 500 MG tablet   Other Relevant Orders   Ambulatory referral to Physical Therapy    Meds ordered this encounter  Medications   methocarbamol  (ROBAXIN ) 500 MG tablet    Sig: Take 1 tablet (500 mg total) by mouth every 8 (eight) hours as needed for muscle spasms.    Dispense:  15 tablet    Refill:  0    No follow-ups on file.  Willam Munford, MD

## 2024-06-17 NOTE — Patient Instructions (Signed)
  VISIT SUMMARY: Today, you were seen for pain in your right groin and leg that has been present for about a week. The pain has not changed in intensity and gets worse with activity. You have no signs of infection or swelling in the area.  YOUR PLAN: -STRAIN OF RIGHT ADDUCTOR MUSCLE: A strain of the right adductor muscle means that the muscle in your inner thigh is overstretched or torn, likely from carrying heavy items. To help with the pain, you have been prescribed a muscle relaxer for severe pain as needed and [employer] muscle rub for topical use. You are also referred to physical therapy at [facility] for exercises to stretch and strengthen the muscle. You can take Tylenol  as needed for pain. Please return if the pain worsens or if you notice any rash, redness, or warmth in the area.  INSTRUCTIONS: Please follow up with physical therapy at [facility] for your stretching and strengthening exercises. Return to the clinic if your pain worsens or if you develop any rash, redness, or warmth in the affected area.                      Contains text generated by [employer]bridge.                                 Contains text generated by [employer].

## 2024-06-17 NOTE — Progress Notes (Unsigned)
   Follow-Up Visit   Subjective  Regina Nash is a 73 y.o. female who presents for the following: Wound check of lesion that was frozen on Monday.    The following portions of the chart were reviewed this encounter and updated as appropriate: medications, allergies, medical history  Review of Systems:  No other skin or systemic complaints except as noted in HPI or Assessment and Plan.  Objective  Well appearing patient in no apparent distress; mood and affect are within normal limits.  A focused examination was performed of the following areas: Left lower extremity  Relevant exam findings are noted in the Assessment and Plan.    Assessment & Plan   Wound Check L medial thigh ISK with subepidermal hemorrhage from cryotherapy. No evidence of infection - Benign, observe. Will self-resolve. If bulla ruptures, gently massage blood out of it but don't remove epidermis - Recommend leaving the area uncovered and coated with Vaseline      VISIT FOR WOUND CHECK    Return if symptoms worsen or fail to improve.  I, Emerick Ege, CMA am acting as scribe for Boneta Sharps, MD.   Documentation: I have reviewed the above documentation for accuracy and completeness, and I agree with the above.  Boneta Sharps, MD

## 2024-06-17 NOTE — Patient Instructions (Signed)

## 2024-07-30 ENCOUNTER — Ambulatory Visit: Admitting: Clinical

## 2024-07-30 DIAGNOSIS — F4310 Post-traumatic stress disorder, unspecified: Secondary | ICD-10-CM | POA: Diagnosis not present

## 2024-07-30 DIAGNOSIS — F4321 Adjustment disorder with depressed mood: Secondary | ICD-10-CM

## 2024-07-30 NOTE — Progress Notes (Signed)
 "  [facility] Counselor/Therapist Progress Note  Patient ID: Regina Nash, MRN: [id number],    Date: 07/30/2024  Time Spent: 2:35pm - 3:41pm : 66 minutes   Treatment Type: Individual Therapy  Reported Symptoms: sadness  Mental Status Exam: Appearance:  Neat and Well Groomed     Behavior: Appropriate  Motor: Normal  Speech/Language:  Clear and Coherent and Normal Rate  Affect: Appropriate  Mood: sad  Thought process: normal  Thought content:   WNL  Sensory/Perceptual disturbances:   WNL  Orientation: oriented to person, place, time/date, and situation  Attention: Good  Concentration: Good  Memory: WNL  Fund of knowledge:  Good  Insight:   Good  Judgment:  Good  Impulse Control: Good   Risk Assessment: Danger to Self:  No Patient denied current suicidal ideation  Self-injurious Behavior: No Danger to Others: No Patient denied current homicidal ideation Duty to Warn:no Physical Aggression / Violence:No  Access to Firearms a concern: No  Gang Involvement:No   Subjective:  Patient reported no changes since last session. Patient reported looking forward to this, I really need to talk about some things in reference to therapy. Patient reported patient's husband was recently in ICU and transferred back to [facility]. Patient reported patient's daughter contracted Ecoli and is still experiencing symptoms. Patient reported concern regarding daughter's and husband's health. Patient stated,they make me sad in reference to situation regarding patient's husband and son. Patient stated, I'm just sad. Patient reported a history of anger related to trauma. Patient reported  a history of panic attacks during marriage to third husband and panic attacks when driving on the interstate or anything unfamiliar. Patient reported patient is open to participation in therapy. Patient stated, I want to be able to let things roll off my back, discontinue smoking, I want to  feel more in control of my life in response to goals for therapy. Patient stated, I just want to feel better, I want to feel happy in response to goals for therapy. Patient stated, I have moments of happiness. Patient stated, I want to re-establish my relationship with my granddaughters in response to goals for therapy.   Interventions: Motivational Interviewing. Clinician conducted session in person at clinician's office at [facility]. Clinician reviewed diagnosis and treatment recommendations; and provided psycho education related to diagnosis and treatment as part of treatment planning process. Clinician utilized motivational interviewing to explore potential goals for therapy. Clinician utilized a task centered approach in collaboration with patient to develop treatment plan. Patient participated in development of treatment plan and verbally agreed to treatment plan.  Diagnosis:Adjustment disorder with depressed mood  PTSD (post-traumatic stress disorder)  Collaboration of Care: Other not required at this time  Plan: Behavioral Health Treatment Plan   Name:Zenya Blumenstein  [employer] [place]  MRN: [id number]   Treatment Plan Development Date: 07/30/24   Strengths: Supportive Relationships and Spirituality, music, watching comedy movies and television shows, documentaries, watching animal videos  Supports: daughter   Client Statement of Needs: Patient reported grief, family issues, childhood trauma.    Treatment Level: outpatient therapy  Client Treatment Preferences: afternoon sessions, in person sessions   Diagnosis: Adjustment Disorder  Adjustment Disorder with depressed mood  Symptoms:  The development of emotional or behavioral symptoms in response to an identifiable stressor(s) occurring within 3 months of the onset of the stressor(s). and These symptoms or behaviors are clinically significant, as evidenced by one or both of the  following:The stress-related disturbance does not  meet the criteria for another mental disorder and is not merely an exacerbation of a preexisting mental disorder.  Goals:  Return to previous functioning and reduce or eliminate symptoms. and Reduce feelings of sadness and hopelessness thereby improving mood.Improve coping skills by learning effective ways to adapt and manage sadness and feelings of panic increase participation in positive/enjoyable activities, re-establish relationships with son and granddaughters  Objectives: Target Date For All Objectives: 07/30/25  Learn to healthy coping skills to practice in response to stress, hardships, and grief, Enhance problem-solving abilities and develop skills to address and resolve challenges., Promote positive behavior change and encourage healthy and productive activities., and Interrupt negative thinking and increase positive thoughts and feelings  Progress Documentation:   Established 07/30/24  Interventions:  Cognitive Behavioral Therapy, Motivational Interviewing, Grief Therapy, and problem solving   Expected duration of treatment: 12 months  Party responsible for implementation of interventions: Darice Seats, MSW, LCSW therapist and Glendale Duet, patient.   This plan has been reviewed and created by the following participants: Darice Seats, MSW, LCSW therapist and Glend[place] Duet, patient.   A new plan will be created at least every 12 months.  The patient fully participated in the development of treatment plan with the clinician and verbally consents to such treatment.   Patient Treatment Plan Signature Obtained: Yes, please see shared S: [address] for sign off.    Darice Seats, LCSW     "

## 2024-07-30 NOTE — Progress Notes (Unsigned)
   Darice Seats, LCSW

## 2024-07-31 NOTE — Progress Notes (Signed)
 Behavioral Health Treatment Plan   Name:Regina Nash  [employer] [place]  MRN: [id number]   Treatment Plan Development Date: 07/30/24   Strengths: Supportive Relationships and Spirituality, music, watching comedy movies and television shows, documentaries, watching animal videos  Supports: daughter   Client Statement of Needs: Patient reported grief, family issues, childhood trauma.    Treatment Level: outpatient therapy  Client Treatment Preferences: afternoon sessions, in person sessions   Diagnosis: Adjustment Disorder  Adjustment Disorder with depressed mood  Symptoms:  The development of emotional or behavioral symptoms in response to an identifiable stressor(s) occurring within 3 months of the onset of the stressor(s). and These symptoms or behaviors are clinically significant, as evidenced by one or both of the following:The stress-related disturbance does not meet the criteria for another mental disorder and is not merely an exacerbation of a preexisting mental disorder.  Goals:  Return to previous functioning and reduce or eliminate symptoms. and Reduce feelings of sadness and hopelessness thereby improving mood.Improve coping skills by learning effective ways to adapt and manage sadness and feelings of panic increase participation in positive/enjoyable activities, re-establish relationships with son and granddaughters  Objectives: Target Date For All Objectives: 07/30/25  Learn to healthy coping skills to practice in response to stress, hardships, and grief, Enhance problem-solving abilities and develop skills to address and resolve challenges., Promote positive behavior change and encourage healthy and productive activities., and Interrupt negative thinking and increase positive thoughts and feelings  Progress Documentation:   Established 07/30/24  Interventions:  Cognitive Behavioral Therapy, Motivational Interviewing, Grief Therapy, and problem  solving   Expected duration of treatment: 12 months  Party responsible for implementation of interventions: Darice Seats, MSW, LCSW therapist and Glendale Duet, patient.   This plan has been reviewed and created by the following participants: Darice Seats, MSW, LCSW therapist and Glendale Duet, patient.   A new plan will be created at least every 12 months.  The patient fully participated in the development of treatment plan with the clinician and verbally consents to such treatment.   Patient Treatment Plan Signature Obtained: Yes, please see shared S: [address] for sign off.                 Darice Seats, LCSW

## 2024-08-04 ENCOUNTER — Ambulatory Visit: Admitting: Internal Medicine

## 2024-08-17 ENCOUNTER — Ambulatory Visit: Admitting: Internal Medicine

## 2024-09-10 ENCOUNTER — Ambulatory Visit: Admission: RE | Admit: 2024-09-10 | Source: Home / Self Care | Admitting: Gastroenterology

## 2024-09-10 ENCOUNTER — Encounter: Admission: RE | Payer: Self-pay | Source: Home / Self Care

## 2024-09-10 SURGERY — COLONOSCOPY
Anesthesia: General

## 2024-09-15 ENCOUNTER — Ambulatory Visit: Admitting: Clinical

## 2024-09-29 ENCOUNTER — Ambulatory Visit: Admitting: Clinical

## 2024-11-26 ENCOUNTER — Ambulatory Visit: Admitting: Internal Medicine

## 2024-11-30 ENCOUNTER — Ambulatory Visit: Admitting: Student

## 2024-12-08 ENCOUNTER — Encounter: Admitting: Dermatology

## 2025-06-14 ENCOUNTER — Ambulatory Visit
